# Patient Record
Sex: Female | Born: 1955 | Race: White | Hispanic: No | Marital: Married | Smoking: Never smoker
Health system: Southern US, Community
[De-identification: ages and names within clinical notes are randomized; demographics above are authoritative.]

## PROBLEM LIST (undated history)

## (undated) DIAGNOSIS — Z8489 Family history of other specified conditions: Secondary | ICD-10-CM

## (undated) DIAGNOSIS — K219 Gastro-esophageal reflux disease without esophagitis: Secondary | ICD-10-CM

## (undated) DIAGNOSIS — M1990 Unspecified osteoarthritis, unspecified site: Secondary | ICD-10-CM

## (undated) DIAGNOSIS — R51 Headache: Secondary | ICD-10-CM

## (undated) DIAGNOSIS — E039 Hypothyroidism, unspecified: Secondary | ICD-10-CM

## (undated) DIAGNOSIS — E66812 Obesity, class 2: Secondary | ICD-10-CM

## (undated) DIAGNOSIS — F329 Major depressive disorder, single episode, unspecified: Secondary | ICD-10-CM

## (undated) DIAGNOSIS — M47812 Spondylosis without myelopathy or radiculopathy, cervical region: Secondary | ICD-10-CM

## (undated) DIAGNOSIS — E669 Obesity, unspecified: Secondary | ICD-10-CM

## (undated) DIAGNOSIS — G5600 Carpal tunnel syndrome, unspecified upper limb: Secondary | ICD-10-CM

## (undated) DIAGNOSIS — Z8614 Personal history of Methicillin resistant Staphylococcus aureus infection: Secondary | ICD-10-CM

## (undated) DIAGNOSIS — G43909 Migraine, unspecified, not intractable, without status migrainosus: Secondary | ICD-10-CM

## (undated) DIAGNOSIS — M2550 Pain in unspecified joint: Secondary | ICD-10-CM

## (undated) DIAGNOSIS — F32A Depression, unspecified: Secondary | ICD-10-CM

## (undated) DIAGNOSIS — G4730 Sleep apnea, unspecified: Secondary | ICD-10-CM

## (undated) DIAGNOSIS — R0683 Snoring: Secondary | ICD-10-CM

## (undated) DIAGNOSIS — R7301 Impaired fasting glucose: Secondary | ICD-10-CM

## (undated) DIAGNOSIS — E785 Hyperlipidemia, unspecified: Secondary | ICD-10-CM

## (undated) DIAGNOSIS — I10 Essential (primary) hypertension: Secondary | ICD-10-CM

## (undated) DIAGNOSIS — R7303 Prediabetes: Secondary | ICD-10-CM

## (undated) HISTORY — PX: CYSTOSCOPY: SUR368

## (undated) HISTORY — DX: Hypothyroidism, unspecified: E03.9

## (undated) HISTORY — PX: UPPER GI ENDOSCOPY: SHX6162

## (undated) HISTORY — DX: Prediabetes: R73.03

## (undated) HISTORY — DX: Carpal tunnel syndrome, unspecified upper limb: G56.00

## (undated) HISTORY — DX: Personal history of Methicillin resistant Staphylococcus aureus infection: Z86.14

## (undated) HISTORY — PX: GANGLION CYST EXCISION: SHX1691

## (undated) HISTORY — PX: COLONOSCOPY: SHX174

## (undated) HISTORY — DX: Obesity, class 2: E66.812

## (undated) HISTORY — DX: Impaired fasting glucose: R73.01

## (undated) HISTORY — DX: Obesity, unspecified: E66.9

## (undated) HISTORY — PX: ESOPHAGOGASTRODUODENOSCOPY: SHX1529

## (undated) HISTORY — DX: Unspecified osteoarthritis, unspecified site: M19.90

## (undated) HISTORY — PX: APPENDECTOMY: SHX54

## (undated) HISTORY — DX: Snoring: R06.83

## (undated) HISTORY — DX: Pain in unspecified joint: M25.50

## (undated) HISTORY — DX: Migraine, unspecified, not intractable, without status migrainosus: G43.909

## (undated) HISTORY — DX: Spondylosis without myelopathy or radiculopathy, cervical region: M47.812

---

## 2001-08-09 ENCOUNTER — Ambulatory Visit (HOSPITAL_COMMUNITY): Admission: RE | Admit: 2001-08-09 | Discharge: 2001-08-09 | Payer: Self-pay | Admitting: Pulmonary Disease

## 2003-02-16 ENCOUNTER — Ambulatory Visit (HOSPITAL_COMMUNITY): Admission: RE | Admit: 2003-02-16 | Discharge: 2003-02-16 | Payer: Self-pay | Admitting: Pulmonary Disease

## 2003-03-02 ENCOUNTER — Encounter (HOSPITAL_COMMUNITY): Admission: RE | Admit: 2003-03-02 | Discharge: 2003-04-01 | Payer: Self-pay | Admitting: Pulmonary Disease

## 2003-05-10 ENCOUNTER — Ambulatory Visit (HOSPITAL_COMMUNITY): Admission: RE | Admit: 2003-05-10 | Discharge: 2003-05-10 | Payer: Self-pay | Admitting: Orthopedic Surgery

## 2003-05-10 ENCOUNTER — Encounter: Payer: Self-pay | Admitting: Orthopedic Surgery

## 2003-05-26 ENCOUNTER — Ambulatory Visit (HOSPITAL_COMMUNITY): Admission: RE | Admit: 2003-05-26 | Discharge: 2003-05-26 | Payer: Self-pay | Admitting: Orthopedic Surgery

## 2003-05-26 ENCOUNTER — Ambulatory Visit (HOSPITAL_BASED_OUTPATIENT_CLINIC_OR_DEPARTMENT_OTHER): Admission: RE | Admit: 2003-05-26 | Discharge: 2003-05-26 | Payer: Self-pay | Admitting: Orthopedic Surgery

## 2003-05-26 ENCOUNTER — Encounter (INDEPENDENT_AMBULATORY_CARE_PROVIDER_SITE_OTHER): Payer: Self-pay | Admitting: Specialist

## 2006-08-31 ENCOUNTER — Ambulatory Visit (HOSPITAL_COMMUNITY): Admission: RE | Admit: 2006-08-31 | Discharge: 2006-08-31 | Payer: Self-pay | Admitting: Pulmonary Disease

## 2006-08-31 ENCOUNTER — Encounter (INDEPENDENT_AMBULATORY_CARE_PROVIDER_SITE_OTHER): Payer: Self-pay | Admitting: *Deleted

## 2006-08-31 ENCOUNTER — Observation Stay (HOSPITAL_COMMUNITY): Admission: EM | Admit: 2006-08-31 | Discharge: 2006-08-31 | Payer: Self-pay | Admitting: Emergency Medicine

## 2007-06-24 ENCOUNTER — Ambulatory Visit: Payer: Self-pay | Admitting: Orthopedic Surgery

## 2007-06-24 DIAGNOSIS — S92309A Fracture of unspecified metatarsal bone(s), unspecified foot, initial encounter for closed fracture: Secondary | ICD-10-CM | POA: Insufficient documentation

## 2007-06-24 DIAGNOSIS — G43909 Migraine, unspecified, not intractable, without status migrainosus: Secondary | ICD-10-CM | POA: Insufficient documentation

## 2007-06-24 DIAGNOSIS — I10 Essential (primary) hypertension: Secondary | ICD-10-CM | POA: Insufficient documentation

## 2007-08-05 ENCOUNTER — Ambulatory Visit: Payer: Self-pay | Admitting: Orthopedic Surgery

## 2009-11-27 ENCOUNTER — Other Ambulatory Visit: Admission: RE | Admit: 2009-11-27 | Discharge: 2009-11-27 | Payer: Self-pay | Admitting: Obstetrics and Gynecology

## 2009-12-05 ENCOUNTER — Ambulatory Visit (HOSPITAL_COMMUNITY): Admission: RE | Admit: 2009-12-05 | Discharge: 2009-12-05 | Payer: Self-pay | Admitting: Obstetrics & Gynecology

## 2009-12-12 ENCOUNTER — Ambulatory Visit (HOSPITAL_COMMUNITY): Admission: RE | Admit: 2009-12-12 | Discharge: 2009-12-12 | Payer: Self-pay | Admitting: Obstetrics & Gynecology

## 2011-01-10 NOTE — Procedures (Signed)
   NAME:  Theresa Young, Theresa Young                          ACCOUNT NO.:  [id number]   MEDICAL RECORD NO.:  [id number]                   PATIENT TYPE:  OUT   LOCATION:  RAD                                  FACILITY:  [facility]   PHYSICIAN:  [place] Bing, M.D.               DATE OF BIRTH:  23-Oct-1955   DATE OF PROCEDURE:  02/16/2003  DATE OF DISCHARGE:                                  ECHOCARDIOGRAM   CLINICAL INFORMATION:  This 55 year old woman with hypertension.   M-MODE:  AORTA:  2.3  (<4.0)  LEFT ATRIUM:  3.2  (<4.0)  SEPTUM:  1.5  (0.7-1.1)  POSTERIOR WALL:  1.2  (0.7-1.1)  LV-DIASTOLE:  3.3  (<5.7)  LV-SYSTOLE:  2.5  (<4.0)   FINDINGS/IMPRESSION:  1. A technically suboptimal but adequate echocardiographic study.  2. Normal left and right atria.  3. Normal right ventricular size and function.  4. Normal aortic, tricuspid and pulmonic valves.  Slight focal calcification     of the mitral valve with normal function.  5. Normal internal dimension of the left ventricle; mild-to-moderate LVH.     Normal regional and global LV systolic function.                                               [place] Bing, M.D.    RR/MEDQ  D:  02/16/2003  T:  02/16/2003  Job:  [id number]

## 2011-01-10 NOTE — Op Note (Signed)
[name], Theresa Young                ACCOUNT NO.:  [id number]   MEDICAL RECORD NO.:  [id number]          PATIENT TYPE:  OBV   LOCATION:  A322                          FACILITY:  [facility]   PHYSICIAN:  Dalia Heading, M.D.  DATE OF BIRTH:  1956-06-24   DATE OF PROCEDURE:  08/31/2006  DATE OF DISCHARGE:  08/31/2006                               OPERATIVE REPORT   PREOPERATIVE DIAGNOSIS:  Acute appendicitis.   POSTOPERATIVE DIAGNOSIS:  Acute appendicitis.   PROCEDURE:  Laparoscopic appendectomy.   SURGEON:  Dr. Franky Macho.   ANESTHESIA:  General endotracheal.   INDICATIONS:  The patient is a 55 year old white female presented to Dr.  Shaune Pollack office with right side abdominal pain.  CT scan of the  abdomen and pelvis was performed which revealed acute appendicitis.  The  patient now comes to the operating room for laparoscopic appendectomy.  Risks and benefits of the procedure including bleeding, infection, and  the possibility of an open procedure were fully explained to the  patient, who gave informed consent.   PROCEDURE NOTE:  The patient is placed in supine position.  After  induction of general endotracheal anesthesia, the abdomen was prepped  and draped in the usual sterile technique with Techni-Care.  Surgical  site confirmation was performed.   A supraumbilical incision was made down to the fascia.  Veress needle  was introduced into the abdominal cavity and confirmation of placement  was done using the saline drop test.  The abdomen was then insufflated  to 16 mmHg pressure.  11 mm trocar was introduced into the abdominal  cavity under direct visualization without difficulty.  The patient was  placed in deeper Trendelenburg position.  Additional 11-mm trocar was  placed in suprapubic region.  A 5-mm trocar was placed left lower  quadrant region.  Appendix was visualized and noted be acutely inflamed.  The mesoappendix was divided using the harmonic scalpel.  Vascular  Endo-  GIA was placed across the base the appendix and fired.  The appendix was  removed using EndoCatch bag.  The appendiceal remnant was inspected and  noted be intact.  The right lower quadrant was then copiously irrigated  with normal saline.  All fluid and air were then evacuated from the  abdominal cavity prior to removal of the trocars.   All wounds were irrigated normal saline.  All wounds were injected with  0.5 cm Sensorcaine.  The supraumbilical fascia as well as suprapubic  fascia reapproximated using 0 Vicryl interrupted sutures.  All skin  incisions were closed using staples.  Bacitracin ointment was then  applied.   All tape and needle counts correct at the end of the procedure.  The  patient was extubated in the operating room and went back to recovery  room awake in stable condition.   COMPLICATIONS:  None.   SPECIMEN:  Appendix.   BLOOD LOSS:  Minimal.      Dalia Heading, M.D.  Electronically Signed     MAJ/MEDQ  D:  08/31/2006  T:  09/01/2006  Job:  [id number]   cc:  Edward L. Luan Pulling, M.D.  Fax: [phone]

## 2011-01-10 NOTE — Op Note (Signed)
   NAME:  Theresa Young, Theresa Young                          ACCOUNT NO.:  [id number]   MEDICAL RECORD NO.:  [id number]                   PATIENT TYPE:  AMB   LOCATION:  DSC                                  FACILITY:  [facility]   PHYSICIAN:  Cindee Salt, M.D.                    DATE OF BIRTH:  01-07-1956   DATE OF PROCEDURE:  05/26/2003  DATE OF DISCHARGE:                                 OPERATIVE REPORT   PREOPERATIVE DIAGNOSIS:  Mass, right thenar eminence.   POSTOPERATIVE DIAGNOSIS:  Mass, right thenar eminence.   OPERATION:  Excision of mass, right thenar eminence.   SURGEON:  Cindee Salt, M.D.   ASSISTANT:  Alfredo Bach.   ANESTHESIA:  IV regional.   HISTORY:  The patient is a 55 year old female with a history of a mass on  the thenar eminence of her right hand, dorsoradial aspect.   PROCEDURE:  The patient was brought to the operating room where a forearm-  based IV regional anesthetic was carried out without difficulty.  She was  prepped with Duraprep, supine position, right arm free.  An oblique incision  was made over the mass and carried down through subcutaneous tissue.  The  radial nerve fibers were identified and protected.  With blunt and sharp  dissection, this was carried down to the first dorsal compartment abductor  tendons.  A multi-lobulated yellow-tan solid tumor was immediately  encountered; this was quite large.  With blunt and sharp dissection, it was  dissected free.  The radial artery was protected.  The entire mass was  excised, bleeders electrocauterized.  The wound was irrigated.  The specimen  was sent to pathology.  The subcutaneous tissue was then closed with  interrupted 4-0 chromic sutures, the skin with interrupted 5-0 nylon  sutures.  It was noted that the mass originated at the level of the  carpometacarpal joint of the thumb and did not appear to enter into the  joint, was adherent to the insertion of the adductor pollicis longus.  A  sterile compressive  dressing and splint were applied.  The patient tolerated  the procedure well and was taken to the recovery room for observation in  satisfactory condition.   She is discharged home to return to the [facility] [place] in one  week on Vicodin and Keflex.                                               Cindee Salt, M.D.    Angelique Blonder  D:  05/26/2003  T:  05/27/2003  Job:  [id number]

## 2011-01-10 NOTE — Procedures (Signed)
   NAME:  Theresa Young, Theresa Young                          ACCOUNT NO.:  [id number]   MEDICAL RECORD NO.:  [id number]                   PATIENT TYPE:  PREC   LOCATION:                                       FACILITY:  [facility]   PHYSICIAN:  Edward L. Juanetta Gosling, M.D.             DATE OF BIRTH:  [birth year]   DATE OF PROCEDURE:  03/02/2003  DATE OF DISCHARGE:                                    STRESS TEST   INDICATIONS FOR PROCEDURE:  [name] has had problems with hypertension  and a very strong positive family history of coronary artery occlusive  disease.  She is undergoing exercise testing to rule out ischemic cardiac  disease.  There are no contraindications for the exercise testing.   RESULTS:  [name] exercised for six minutes and 20 seconds on the Bruce  protocol reaching and sustaining 7.0 METs.  Cardiolite was injected per  protocol.  Her maximum recorded heart rate was 166 which is 95% of her  maximal heart rate.  She stopped exercising because of fatigue.  Her blood  pressures while exercising were normal.  There were no electrocardiographic  changes suggestive of inducible ischemia.   IMPRESSION:  1. Good exercise tolerance.  2. No evidence of inducible ischemia.  3. No symptoms during exercise.  4. Cardiolite images are pending.  5. Normal blood pressure response to exercise.                                               Edward L. Juanetta Gosling, M.D.    ELH/MEDQ  D:  03/02/2003  T:  03/02/2003  Job:  [id number]

## 2011-10-16 ENCOUNTER — Other Ambulatory Visit: Payer: Self-pay | Admitting: Adult Health

## 2011-10-16 ENCOUNTER — Other Ambulatory Visit (HOSPITAL_COMMUNITY)
Admission: RE | Admit: 2011-10-16 | Discharge: 2011-10-16 | Disposition: A | Discharge status: Home / Self Care | Payer: PRIVATE HEALTH INSURANCE | Source: Ambulatory Visit | Attending: Obstetrics and Gynecology | Admitting: Obstetrics and Gynecology

## 2011-10-16 DIAGNOSIS — Z01419 Encounter for gynecological examination (general) (routine) without abnormal findings: Secondary | ICD-10-CM | POA: Insufficient documentation

## 2011-10-16 DIAGNOSIS — E049 Nontoxic goiter, unspecified: Secondary | ICD-10-CM

## 2011-10-20 ENCOUNTER — Other Ambulatory Visit (HOSPITAL_COMMUNITY): Payer: Self-pay

## 2011-10-23 ENCOUNTER — Ambulatory Visit (HOSPITAL_COMMUNITY)
Admission: RE | Admit: 2011-10-23 | Discharge: 2011-10-23 | Disposition: A | Discharge status: Home / Self Care | Payer: PRIVATE HEALTH INSURANCE | Source: Ambulatory Visit | Attending: Adult Health | Admitting: Adult Health

## 2011-10-23 DIAGNOSIS — E049 Nontoxic goiter, unspecified: Secondary | ICD-10-CM | POA: Insufficient documentation

## 2011-11-11 ENCOUNTER — Other Ambulatory Visit (HOSPITAL_COMMUNITY): Payer: Self-pay | Admitting: General Surgery

## 2011-11-11 DIAGNOSIS — E041 Nontoxic single thyroid nodule: Secondary | ICD-10-CM

## 2011-11-14 ENCOUNTER — Other Ambulatory Visit (HOSPITAL_COMMUNITY): Payer: Self-pay | Admitting: General Surgery

## 2011-11-14 ENCOUNTER — Ambulatory Visit (HOSPITAL_COMMUNITY)
Admission: RE | Admit: 2011-11-14 | Discharge: 2011-11-14 | Disposition: A | Discharge status: Home / Self Care | Payer: PRIVATE HEALTH INSURANCE | Source: Ambulatory Visit | Attending: General Surgery | Admitting: General Surgery

## 2011-11-14 VITALS — BP 134/85 | HR 81 | Temp 97.7°F | Resp 16

## 2011-11-14 DIAGNOSIS — E041 Nontoxic single thyroid nodule: Secondary | ICD-10-CM

## 2011-11-14 DIAGNOSIS — E049 Nontoxic goiter, unspecified: Secondary | ICD-10-CM | POA: Insufficient documentation

## 2011-11-14 NOTE — Progress Notes (Signed)
Lidocaine 2%          4mL injected 

## 2011-11-14 NOTE — Procedures (Signed)
SUCCESSFUL [place] RT THYROID MASS FNA X 4 NO COMP STABLE TO DC

## 2011-11-18 ENCOUNTER — Inpatient Hospital Stay (HOSPITAL_COMMUNITY): Admission: RE | Admit: 2011-11-18 | Payer: PRIVATE HEALTH INSURANCE | Source: Ambulatory Visit

## 2011-11-18 ENCOUNTER — Ambulatory Visit (HOSPITAL_COMMUNITY): Payer: PRIVATE HEALTH INSURANCE

## 2011-12-01 NOTE — H&P (Signed)
  NTS SOAP Note  Vital Signs:  Vitals as of: 11/06/2011: Systolic 151: Diastolic 87: Heart Rate 77: Temp 96.8F: Height 89ft 8in: Weight 236Lbs 0 Ounces: OFC 0in: Respiratory Rate 0: O2 Saturation 0: Pain Level 0: BMI 36  BMI : 35.88 kg/m2  Subjective: This 9 Years 57 Months old Female presents for of  THYROID NODULE: ,Has an enlarged right thyroid lobe.  Has had FNA in 2011 which was benign.  Recent u/s of thyroid shows enlargement of the right lobe with a complex nodule.  Left lobe with stable small hypoechoeic nodule.  Denies neck irradiatiion, family h/o thyoid cancer, voice change, heart palpitations, heat intolerance, dysphagia.  Review of Symptoms:  Constitutional:unremarkable migraines Eyes:unremarkable Nose/Mouth/Throat:unremarkable Cardiovascular:unremarkable Respiratory:unremarkable Gastrointestinal:unremarkable Genitourinary:unremarkable Musculoskeletal:unremarkable Skin:unremarkable Hematolgic/Lymphatic:unremarkable Allergic/Immunologic:unremarkable   Past Medical History:Reviewed   Past Medical History  Surgical History: appendectomy 2008 Medical Problems: headaches, high cholesterol, HTN Allergies: erythromycin Medications: hyzaar, relpax, crestor   Social History:Reviewed   Social History  Preferred Language: English ([place]) Race:  White Ethnicity: Not Hispanic / Latino Age: 56 Years 10 Months Marital Status:  S Alcohol:  No Recreational drug(s):  No   Smoking Status: Never smoker reviewed on 11/06/2011  Family History:Reviewed   Family History              Father:  Coronary Artery Disease             Mother:  Cancer-lung    Objective Information: General:Well appearing, well nourished in no distress. Head:Atraumatic; no masses; no abnormalities Enlarged right lobe of thyroid with obvious palpable nodule.  No lymphadenopathy noted.  Left lobe within normal  limits. Heart:RRR, no murmur or gallop.  Normal S1, S2.  No S3, S4.  Lungs:CTA bilaterally, no wheezes, rhonchi, rales.  Breathing unlabored.  Assessment:Enlarging nodule, right thyroid lobe, FNA shows follicular neoplasm  Diagnosis &amp; Procedure: DiagnosisCode: 241.0, ProcedureCode: 45409,    Plan:Scheduled for total thyroidectomy on 12/10/11.  Risks and benefits of procedure including bleeding, infection, the possibility of malignancy, and the possibility of nerve injury were fully explained to the patient, who gives informed consent.   Follow-up:Pending Test Results

## 2011-12-02 ENCOUNTER — Encounter (HOSPITAL_COMMUNITY): Payer: Self-pay | Admitting: Pharmacy Technician

## 2011-12-02 NOTE — Patient Instructions (Addendum)
[address] Simonian Stamey  12/02/2011   Your procedure is scheduled on:  Wednesday, April 17  Report to [facility] at 0800 AM.  Call this number if you have problems the morning of surgery: [phone]   Remember:   Do not eat food:After Midnight.  May have clear liquids:until Midnight .  Clear liquids include soda, tea, black coffee, apple or grape juice, broth.  Take these medicines the morning of surgery with A SIP OF WATER: Hyzaar   Do not wear jewelry, make-up or nail polish.  Do not wear lotions, powders, or perfumes. You may wear deodorant.  Do not shave 48 hours prior to surgery.  Do not bring valuables to the hospital.  Contacts, dentures or bridgework may not be worn into surgery.  Leave suitcase in the car. After surgery it may be brought to your room.  For patients admitted to the hospital, checkout time is 11:00 AM the day of discharge.   Patients discharged the day of surgery will not be allowed to drive home.  Name and phone number of your driver: Family  Special Instructions: CHG Shower Use Special Wash: 1/2 bottle night before surgery and 1/2 bottle morning of surgery.   Please read over the following fact sheets that you were given: Pain Booklet, Coughing and Deep Breathing, Surgical Site Infection Prevention, Anesthesia Post-op Instructions and Care and Recovery After Surgery  PATIENT INSTRUCTIONS POST-ANESTHESIA  IMMEDIATELY FOLLOWING SURGERY:  Do not drive or operate machinery for the first twenty four hours after surgery.  Do not make any important decisions for twenty four hours after surgery or while taking narcotic pain medications or sedatives.  If you develop intractable nausea and vomiting or a severe headache please notify your doctor immediately.  FOLLOW-UP:  Please make an appointment with your surgeon as instructed. You do not need to follow up with anesthesia unless specifically instructed to do so.  WOUND CARE INSTRUCTIONS (if applicable):  Keep a dry clean  dressing on the anesthesia/puncture wound site if there is drainage.  Once the wound has quit draining you may leave it open to air.  Generally you should leave the bandage intact for twenty four hours unless there is drainage.  If the epidural site drains for more than 36-48 hours please call the anesthesia department.  QUESTIONS?:  Please feel free to call your physician or the hospital operator if you have any questions, and they will be happy to assist you.     Thyroidectomy Thyroidectomy is the removal of part or all of your thyroid gland. Your thyroid gland is a butterfly-shaped gland at the base of your neck. It produces a substance called thyroid hormone, which regulates the physical and chemical processes that keep your body functioning and make energy available to your body (metabolism). The amount of thyroid gland tissue that is removed during a thyroidectomy depends on the reason for the procedure. Typically, if only a part of your gland is removed, enough thyroid gland tissue remains to maintain normal function. If your entire thyroid gland is removed or if the amount of thyroid gland tissue remaining is inadequate to maintain normal function, you will need life-long treatment with thyroid hormone on a daily basis. Thyroidectomy maybe performed when you have the following conditions:  Thyroid nodules. These are small, abnormal collections of tissue that form inside the thyroid gland. If these nodules begin to enlarge at a rapid rate, a sample of tissue from the nodule is taken through a needle and examined (needle  biopsy). This is done to determine if the nodules are cancerous. Depending on the outcome of this exam, thyroidectomy may be necessary.   Thyroid cancer.   Goiter, which is an enlarged thyroid gland. All or part of the thyroid gland may be removed if the gland has become so large that it causes difficulty breathing or swallowing.   Hyperthyroidism. This is when the thyroid gland  produces too much thyroid hormone. Hypothyroidism can cause symptoms of fluctuating weight, intolerance to heat, irritability, shortness of breath, and chest pain.  LET YOUR CAREGIVER KNOW ABOUT:   Allergies to food or medicine.   Medicines that you are taking, including vitamins, herbs, eyedrops, over-the-counter medicines, and creams.   Previous problems you have had with anesthetics or numbing medicines.   History of bleeding problems or blood clots.   Previous surgeries you have had.   Other health problems, including diabetes and kidney problems, you have had.   Possibility of pregnancy, if this applies.  BEFORE THE PROCEDURE   Do not eat or drink anything, including water, for at least 6 hours before the procedure.   Ask your caregiver whether you should stop taking certain medicines before the day of the procedure.  PROCEDURE  There are different ways that thyroidectomy is performed. For each type, you will be given a medicine to make you sleep (general anesthetic). The three main types of thyroidectomy are listed as follows:  Conventional thyroidectomy-A cut (incision) in the center portion of your lower neck is made with a scalpel. Muscles below your skin are separated to gain access to your thyroid gland. Your thyroid gland is dissected from your windpipe (trachea). Often a drain is placed at the incision site to drain any blood that accumulates under the skin after the procedure. This drain will be removed before you go home. The wound from the incision should heal within 2 weeks.   Endoscopic thyroidectomy-Small incisions are made in your lower neck. A small instrument (endoscope) is inserted under your skin at the incision sites. The endoscope used for thyroidectomy consists of 2 flexible tubes. Inside one of the tubes is a video camera that is used to guide the Careers adviser. Tools to remove the thyroid gland, including a tool to cut the gland (dissectors) and a suction device, are  inserted through the other tube. The surgeon uses the dissectors to dissect the thyroid gland from the trachea and remove it.   Robotic thyroidectomy-This procedure allows your thyroid gland to be removed through incisions in your armpit, your chest, or high in your neck. Instruments similar to endoscopes provide a 3-dimensional picture of the surgical site. Dissecting instruments are controlled by devices similar to joysticks. These devices allow more accurate manipulation of the instruments. After the blood supply to the gland is removed, the gland is cut into several pieces and removed through the incisions.  RISKS AND COMPLICATIONS Complications associated with thyroidectomy are rare, but they can occur. Possible complications include:  A decrease in parathyroid hormone levels (hypoparathyroidism)-Your parathyroid glands are located close behind your thyroid gland. They are responsible for maintaining calcium levels inthe body. If they are damaged or removed, levels of calcium in the blood become low and nerves become irritable, which can cause muscle spasms. Medicines are available to treat this.   Bacterial infection-This can often be treated with medicines that kill bacteria (antibiotics).   Damage to your voice box nerves-This could cause hoarseness or complete loss of voice.   Bleeding or airway obstruction.  AFTER  THE PROCEDURE   You will rest in the recovery room as you wake up.   When you first wake up, your throat may feel slightly sore.   You will not be allowed to eat or drink until instructed otherwise.   You will be taken to your hospital room. You will usually stay at the hospital for 1 or 2 nights.   If a drain is placed during the procedure, it usually is removed the next day.   You may have some mild neck pain.   Your voice may be weak. This usually is temporary.  Document Released: 02/04/2001 Document Revised: 07/31/2011 Document Reviewed: 11/13/2010 [facility]  Patient Information 2012 [place], [place].

## 2011-12-03 ENCOUNTER — Encounter (HOSPITAL_COMMUNITY)
Admission: RE | Admit: 2011-12-03 | Discharge: 2011-12-03 | Disposition: A | Discharge status: Home / Self Care | Payer: PRIVATE HEALTH INSURANCE | Source: Ambulatory Visit | Attending: General Surgery | Admitting: General Surgery

## 2011-12-03 ENCOUNTER — Encounter (HOSPITAL_COMMUNITY): Payer: Self-pay

## 2011-12-03 HISTORY — DX: Sleep apnea, unspecified: G47.30

## 2011-12-03 HISTORY — DX: Major depressive disorder, single episode, unspecified: F32.9

## 2011-12-03 HISTORY — DX: Depression, unspecified: F32.A

## 2011-12-03 HISTORY — DX: Headache: R51

## 2011-12-03 HISTORY — DX: Hyperlipidemia, unspecified: E78.5

## 2011-12-03 HISTORY — DX: Unspecified osteoarthritis, unspecified site: M19.90

## 2011-12-03 HISTORY — DX: Gastro-esophageal reflux disease without esophagitis: K21.9

## 2011-12-03 HISTORY — DX: Essential (primary) hypertension: I10

## 2011-12-03 LAB — DIFFERENTIAL
Basophils Relative: 0 % (ref 0–1)
Eosinophils Absolute: 0.2 10*3/uL (ref 0.0–0.7)
Lymphocytes Relative: 28 % (ref 12–46)
Lymphs Abs: 2.2 10*3/uL (ref 0.7–4.0)
Monocytes Absolute: 0.6 10*3/uL (ref 0.1–1.0)
Monocytes Relative: 7 % (ref 3–12)

## 2011-12-03 LAB — BASIC METABOLIC PANEL
CO2: 32 mEq/L (ref 19–32)
Chloride: 101 mEq/L (ref 96–112)
Creatinine, Ser: 0.81 mg/dL (ref 0.50–1.10)
GFR calc Af Amer: >90 mL/min (ref >90)
Potassium: 3.8 mEq/L (ref 3.5–5.1)

## 2011-12-03 LAB — SURGICAL PCR SCREEN: MRSA, PCR: NEGATIVE

## 2011-12-03 LAB — CBC
MCV: 90.1 fL (ref 78.0–100.0)
Platelets: 225 10*3/uL (ref 150–400)
RDW: 12.7 % (ref 11.5–15.5)
WBC: 7.7 10*3/uL (ref 4.0–10.5)

## 2011-12-03 NOTE — Progress Notes (Signed)
12/03/11 1427  OBSTRUCTIVE SLEEP APNEA  Have you ever been diagnosed with sleep apnea through a sleep study? No  Do you snore loudly (loud enough to be heard through closed doors)?  1  Do you often feel tired, fatigued, or sleepy during the daytime? 0  Has anyone observed you stop breathing during your sleep? 1  Do you have, or are you being treated for high blood pressure? 1  BMI more than 35 kg/m2? 1  Age over 54 years old? 1  Neck circumference greater than 40 cm/18 inches? 0  Gender: 0  Obstructive Sleep Apnea Score 5   Score 4 or greater  Updated health history;Results sent to PCP

## 2011-12-10 ENCOUNTER — Encounter (HOSPITAL_COMMUNITY)
Admission: RE | Disposition: A | Discharge status: Home / Self Care | Payer: Self-pay | Source: Ambulatory Visit | Attending: General Surgery

## 2011-12-10 ENCOUNTER — Ambulatory Visit (HOSPITAL_COMMUNITY)
Admission: RE | Admit: 2011-12-10 | Discharge: 2011-12-11 | Disposition: A | Discharge status: Home / Self Care | Payer: PRIVATE HEALTH INSURANCE | Source: Ambulatory Visit | Attending: General Surgery | Admitting: General Surgery

## 2011-12-10 ENCOUNTER — Ambulatory Visit (HOSPITAL_COMMUNITY): Payer: PRIVATE HEALTH INSURANCE | Admitting: Anesthesiology

## 2011-12-10 ENCOUNTER — Encounter (HOSPITAL_COMMUNITY): Payer: Self-pay | Admitting: Anesthesiology

## 2011-12-10 ENCOUNTER — Encounter (HOSPITAL_COMMUNITY): Payer: Self-pay | Admitting: *Deleted

## 2011-12-10 DIAGNOSIS — Z01810 Encounter for preprocedural cardiovascular examination: Secondary | ICD-10-CM | POA: Insufficient documentation

## 2011-12-10 DIAGNOSIS — E7800 Pure hypercholesterolemia, unspecified: Secondary | ICD-10-CM | POA: Insufficient documentation

## 2011-12-10 DIAGNOSIS — Z01812 Encounter for preprocedural laboratory examination: Secondary | ICD-10-CM | POA: Insufficient documentation

## 2011-12-10 DIAGNOSIS — E049 Nontoxic goiter, unspecified: Secondary | ICD-10-CM | POA: Insufficient documentation

## 2011-12-10 DIAGNOSIS — Z79899 Other long term (current) drug therapy: Secondary | ICD-10-CM | POA: Insufficient documentation

## 2011-12-10 DIAGNOSIS — I10 Essential (primary) hypertension: Secondary | ICD-10-CM | POA: Insufficient documentation

## 2011-12-10 HISTORY — PX: THYROIDECTOMY: SHX17

## 2011-12-10 LAB — BASIC METABOLIC PANEL
BUN: 7 mg/dL (ref 6–23)
CO2: 27 mEq/L (ref 19–32)
Calcium: 8.9 mg/dL (ref 8.4–10.5)
Creatinine, Ser: 0.83 mg/dL (ref 0.50–1.10)

## 2011-12-10 SURGERY — THYROIDECTOMY
Anesthesia: General | Site: Neck | Wound class: Clean

## 2011-12-10 MED ORDER — MENTHOL 3 MG MT LOZG: 1.0000 | LOZENGE | OROMUCOSAL | Status: DC | PRN

## 2011-12-10 MED ORDER — ENOXAPARIN SODIUM 40 MG/0.4ML ~~LOC~~ SOLN
SUBCUTANEOUS | Status: AC
  Filled 2011-12-10: qty 0.4

## 2011-12-10 MED ORDER — HEMOSTATIC AGENTS (NO CHARGE) OPTIME
TOPICAL | Status: DC | PRN
  Administered 2011-12-10: 1 via TOPICAL

## 2011-12-10 MED ORDER — MIDAZOLAM HCL 2 MG/2ML IJ SOLN
INTRAMUSCULAR | Status: AC
  Filled 2011-12-10: qty 2

## 2011-12-10 MED ORDER — PROPOFOL 10 MG/ML IV EMUL
INTRAVENOUS | Status: AC
  Filled 2011-12-10: qty 20

## 2011-12-10 MED ORDER — BUPIVACAINE HCL (PF) 0.5 % IJ SOLN
INTRAMUSCULAR | Status: DC | PRN
  Administered 2011-12-10: 4 mL

## 2011-12-10 MED ORDER — PHENYLEPHRINE HCL 10 MG/ML IJ SOLN
INTRAMUSCULAR | Status: DC | PRN
  Administered 2011-12-10 (×3): 100 ug via INTRAVENOUS

## 2011-12-10 MED ORDER — ROCURONIUM BROMIDE 50 MG/5ML IV SOLN
INTRAVENOUS | Status: AC
  Filled 2011-12-10: qty 1

## 2011-12-10 MED ORDER — ONDANSETRON HCL 4 MG PO TABS: 4.0000 mg | ORAL_TABLET | Freq: Four times a day (QID) | ORAL | Status: DC | PRN

## 2011-12-10 MED ORDER — LEVOTHYROXINE SODIUM 150 MCG PO TABS
150.0000 ug | ORAL_TABLET | Freq: Every day | ORAL | Status: DC
  Administered 2011-12-11: 150 ug via ORAL
  Filled 2011-12-10 (×2): qty 1

## 2011-12-10 MED ORDER — ROCURONIUM BROMIDE 100 MG/10ML IV SOLN
INTRAVENOUS | Status: DC | PRN
  Administered 2011-12-10: 10 mg via INTRAVENOUS
  Administered 2011-12-10: 50 mg via INTRAVENOUS

## 2011-12-10 MED ORDER — LOSARTAN POTASSIUM-HCTZ 100-12.5 MG PO TABS: 1.0000 | ORAL_TABLET | Freq: Every morning | ORAL | Status: DC

## 2011-12-10 MED ORDER — FENTANYL CITRATE 0.05 MG/ML IJ SOLN
INTRAMUSCULAR | Status: DC | PRN
  Administered 2011-12-10 (×3): 50 ug via INTRAVENOUS
  Administered 2011-12-10: 100 ug via INTRAVENOUS

## 2011-12-10 MED ORDER — CALCIUM CARBONATE-VITAMIN D 500-200 MG-UNIT PO TABS
1.0000 | ORAL_TABLET | Freq: Two times a day (BID) | ORAL | Status: DC
  Administered 2011-12-10 – 2011-12-11 (×3): 1 via ORAL
  Filled 2011-12-10 (×3): qty 1

## 2011-12-10 MED ORDER — DEXAMETHASONE SODIUM PHOSPHATE 4 MG/ML IJ SOLN
4.0000 mg | Freq: Once | INTRAMUSCULAR | Status: AC
  Administered 2011-12-10: 4 mg via INTRAVENOUS

## 2011-12-10 MED ORDER — LOSARTAN POTASSIUM 50 MG PO TABS
100.0000 mg | ORAL_TABLET | Freq: Every day | ORAL | Status: DC
  Administered 2011-12-11: 100 mg via ORAL
  Filled 2011-12-10: qty 2

## 2011-12-10 MED ORDER — LACTATED RINGERS IV SOLN
INTRAVENOUS | Status: DC | PRN
  Administered 2011-12-10 (×3): via INTRAVENOUS

## 2011-12-10 MED ORDER — PANTOPRAZOLE SODIUM 40 MG PO TBEC
40.0000 mg | DELAYED_RELEASE_TABLET | Freq: Every day | ORAL | Status: DC
  Administered 2011-12-10 – 2011-12-11 (×2): 40 mg via ORAL
  Filled 2011-12-10 (×2): qty 1

## 2011-12-10 MED ORDER — EPHEDRINE SULFATE 50 MG/ML IJ SOLN
INTRAMUSCULAR | Status: DC | PRN
  Administered 2011-12-10: 10 mg via INTRAVENOUS

## 2011-12-10 MED ORDER — FENTANYL CITRATE 0.05 MG/ML IJ SOLN
INTRAMUSCULAR | Status: AC
  Filled 2011-12-10: qty 2

## 2011-12-10 MED ORDER — ENOXAPARIN SODIUM 40 MG/0.4ML ~~LOC~~ SOLN
40.0000 mg | SUBCUTANEOUS | Status: DC
  Administered 2011-12-11: 40 mg via SUBCUTANEOUS
  Filled 2011-12-10: qty 0.4

## 2011-12-10 MED ORDER — GLYCOPYRROLATE 0.2 MG/ML IJ SOLN
INTRAMUSCULAR | Status: AC
  Filled 2011-12-10: qty 4

## 2011-12-10 MED ORDER — ONDANSETRON HCL 4 MG/2ML IJ SOLN: 4.0000 mg | Freq: Four times a day (QID) | INTRAMUSCULAR | Status: DC | PRN

## 2011-12-10 MED ORDER — MIDAZOLAM HCL 2 MG/2ML IJ SOLN
1.0000 mg | INTRAMUSCULAR | Status: DC | PRN
  Administered 2011-12-10: 1 mg via INTRAVENOUS

## 2011-12-10 MED ORDER — ACETAMINOPHEN 10 MG/ML IV SOLN
1000.0000 mg | Freq: Four times a day (QID) | INTRAVENOUS | Status: AC
  Administered 2011-12-10 – 2011-12-11 (×3): 1000 mg via INTRAVENOUS
  Filled 2011-12-10 (×3): qty 100

## 2011-12-10 MED ORDER — FENTANYL CITRATE 0.05 MG/ML IJ SOLN
25.0000 ug | INTRAMUSCULAR | Status: DC | PRN
  Administered 2011-12-10: 25 ug via INTRAVENOUS
  Administered 2011-12-10: 50 ug via INTRAVENOUS
  Administered 2011-12-10: 25 ug via INTRAVENOUS

## 2011-12-10 MED ORDER — SODIUM CHLORIDE 0.9 % IR SOLN
Status: DC | PRN
  Administered 2011-12-10: 1000 mL

## 2011-12-10 MED ORDER — ONDANSETRON HCL 4 MG/2ML IJ SOLN
INTRAMUSCULAR | Status: AC
  Filled 2011-12-10: qty 2

## 2011-12-10 MED ORDER — NEOSTIGMINE METHYLSULFATE 1 MG/ML IJ SOLN
INTRAMUSCULAR | Status: AC
  Filled 2011-12-10: qty 10

## 2011-12-10 MED ORDER — HYDROMORPHONE HCL PF 1 MG/ML IJ SOLN
1.0000 mg | INTRAMUSCULAR | Status: DC | PRN
  Administered 2011-12-10: 1 mg via INTRAVENOUS
  Filled 2011-12-10: qty 1

## 2011-12-10 MED ORDER — NEOSTIGMINE METHYLSULFATE 1 MG/ML IJ SOLN
INTRAMUSCULAR | Status: DC | PRN
  Administered 2011-12-10: 5 mg via INTRAVENOUS

## 2011-12-10 MED ORDER — EPHEDRINE SULFATE 50 MG/ML IJ SOLN
INTRAMUSCULAR | Status: AC
  Filled 2011-12-10: qty 1

## 2011-12-10 MED ORDER — FENTANYL CITRATE 0.05 MG/ML IJ SOLN
INTRAMUSCULAR | Status: AC
  Administered 2011-12-10: 25 ug via INTRAVENOUS
  Filled 2011-12-10: qty 5

## 2011-12-10 MED ORDER — ONDANSETRON HCL 4 MG/2ML IJ SOLN: 4.0000 mg | Freq: Once | INTRAMUSCULAR | Status: DC | PRN

## 2011-12-10 MED ORDER — PHENYLEPHRINE HCL 10 MG/ML IJ SOLN
INTRAMUSCULAR | Status: AC
  Filled 2011-12-10: qty 1

## 2011-12-10 MED ORDER — GLYCOPYRROLATE 0.2 MG/ML IJ SOLN
INTRAMUSCULAR | Status: AC
  Filled 2011-12-10: qty 1

## 2011-12-10 MED ORDER — HYDROCHLOROTHIAZIDE 12.5 MG PO CAPS
12.5000 mg | ORAL_CAPSULE | Freq: Every day | ORAL | Status: DC
  Administered 2011-12-11: 12.5 mg via ORAL
  Filled 2011-12-10: qty 1

## 2011-12-10 MED ORDER — LIDOCAINE HCL 4 % MT SOLN
OROMUCOSAL | Status: DC | PRN
  Administered 2011-12-10: 4 mL via TOPICAL

## 2011-12-10 MED ORDER — MIDAZOLAM HCL 5 MG/5ML IJ SOLN
INTRAMUSCULAR | Status: DC | PRN
  Administered 2011-12-10: 1 mg via INTRAVENOUS

## 2011-12-10 MED ORDER — LACTATED RINGERS IV SOLN
INTRAVENOUS | Status: DC
  Administered 2011-12-10 – 2011-12-11 (×3): via INTRAVENOUS

## 2011-12-10 MED ORDER — GLYCOPYRROLATE 0.2 MG/ML IJ SOLN
INTRAMUSCULAR | Status: DC | PRN
  Administered 2011-12-10: .8 mg via INTRAVENOUS

## 2011-12-10 MED ORDER — PROPOFOL 10 MG/ML IV EMUL
INTRAVENOUS | Status: DC | PRN
  Administered 2011-12-10: 150 mg via INTRAVENOUS

## 2011-12-10 MED ORDER — GLYCOPYRROLATE 0.2 MG/ML IJ SOLN
0.2000 mg | Freq: Once | INTRAMUSCULAR | Status: AC
  Administered 2011-12-10: 0.2 mg via INTRAVENOUS

## 2011-12-10 MED ORDER — BUPIVACAINE HCL (PF) 0.5 % IJ SOLN
INTRAMUSCULAR | Status: AC
  Filled 2011-12-10: qty 30

## 2011-12-10 MED ORDER — LACTATED RINGERS IV SOLN
INTRAVENOUS | Status: DC
  Administered 2011-12-10: 09:00:00 via INTRAVENOUS

## 2011-12-10 MED ORDER — FENTANYL CITRATE 0.05 MG/ML IJ SOLN
INTRAMUSCULAR | Status: AC
  Administered 2011-12-10: 25 ug via INTRAVENOUS
  Filled 2011-12-10: qty 2

## 2011-12-10 MED ORDER — ACETAMINOPHEN 10 MG/ML IV SOLN
INTRAVENOUS | Status: AC
  Filled 2011-12-10: qty 100

## 2011-12-10 MED ORDER — DEXAMETHASONE SODIUM PHOSPHATE 4 MG/ML IJ SOLN
INTRAMUSCULAR | Status: AC
  Filled 2011-12-10: qty 1

## 2011-12-10 MED ORDER — ENOXAPARIN SODIUM 40 MG/0.4ML ~~LOC~~ SOLN
40.0000 mg | Freq: Once | SUBCUTANEOUS | Status: AC
  Administered 2011-12-10: 40 mg via SUBCUTANEOUS

## 2011-12-10 MED ORDER — ONDANSETRON HCL 4 MG/2ML IJ SOLN
4.0000 mg | Freq: Once | INTRAMUSCULAR | Status: AC
  Administered 2011-12-10: 4 mg via INTRAVENOUS

## 2011-12-10 SURGICAL SUPPLY — 65 items
ADH SKN CLS APL DERMABOND .7 (GAUZE/BANDAGES/DRESSINGS)
APPLIER CLIP 11 MED OPEN (CLIP)
APPLIER CLIP 9.375 SM OPEN (CLIP) ×6
APR CLP MED 11 20 MLT OPN (CLIP)
APR CLP SM 9.3 20 MLT OPN (CLIP) ×3
ATTRACTOMAT 16X20 MAGNETIC DRP (DRAPES) ×2 IMPLANT
BAG HAMPER (MISCELLANEOUS) ×2 IMPLANT
BLADE SURG 15 STRL LF DISP TIS (BLADE) ×1 IMPLANT
BLADE SURG 15 STRL SS (BLADE) ×6
BLADE SURG SZ10 CARB STEEL (BLADE) ×2 IMPLANT
CLIP APPLIE 11 MED OPEN (CLIP) ×1 IMPLANT
CLIP APPLIE 9.375 SM OPEN (CLIP) ×1 IMPLANT
CLOTH BEACON ORANGE TIMEOUT ST (SAFETY) ×2 IMPLANT
COVER SURGICAL LIGHT HANDLE (MISCELLANEOUS) ×4 IMPLANT
DERMABOND ADVANCED (GAUZE/BANDAGES/DRESSINGS)
DERMABOND ADVANCED .7 DNX12 (GAUZE/BANDAGES/DRESSINGS) ×1 IMPLANT
DRAPE PED LAPAROTOMY (DRAPES) ×2 IMPLANT
DRAPE PROXIMA HALF (DRAPES) ×3 IMPLANT
DURAPREP 26ML APPLICATOR (WOUND CARE) ×1 IMPLANT
ELECT NDL TIP 2.8 STRL (NEEDLE) ×1 IMPLANT
ELECT NEEDLE TIP 2.8 STRL (NEEDLE) ×2 IMPLANT
ELECT REM PT RETURN 9FT ADLT (ELECTROSURGICAL) ×2
ELECTRODE REM PT RTRN 9FT ADLT (ELECTROSURGICAL) ×1 IMPLANT
FORMALIN 10 PREFIL 120ML (MISCELLANEOUS) ×4 IMPLANT
GAUZE SPONGE 4X4 16PLY XRAY LF (GAUZE/BANDAGES/DRESSINGS) ×4 IMPLANT
GLOVE BIO SURGEON STRL SZ7.5 (GLOVE) ×2 IMPLANT
GLOVE BIOGEL PI IND STRL 7.0 (GLOVE) IMPLANT
GLOVE BIOGEL PI IND STRL 7.5 (GLOVE) IMPLANT
GLOVE BIOGEL PI INDICATOR 7.0 (GLOVE) ×1
GLOVE BIOGEL PI INDICATOR 7.5 (GLOVE) ×1
GLOVE ECLIPSE 6.5 STRL STRAW (GLOVE) ×1 IMPLANT
GLOVE ECLIPSE 7.0 STRL STRAW (GLOVE) ×1 IMPLANT
GOWN STRL REIN XL XLG (GOWN DISPOSABLE) ×6 IMPLANT
HEMOSTAT SURGICEL 2X3 (HEMOSTASIS) ×2 IMPLANT
HEMOSTAT SURGICEL 4X8 (HEMOSTASIS) ×1 IMPLANT
KIT BLADEGUARD II DBL (SET/KITS/TRAYS/PACK) ×2 IMPLANT
KIT ROOM TURNOVER APOR (KITS) ×2 IMPLANT
MANIFOLD NEPTUNE II (INSTRUMENTS) ×2 IMPLANT
MARKER SKIN DUAL TIP RULER LAB (MISCELLANEOUS) ×2 IMPLANT
NDL HYPO 25X1 1.5 SAFETY (NEEDLE) ×1 IMPLANT
NEEDLE HYPO 25X1 1.5 SAFETY (NEEDLE) ×2 IMPLANT
NS IRRIG 1000ML POUR BTL (IV SOLUTION) ×2 IMPLANT
PACK BASIC III (CUSTOM PROCEDURE TRAY) ×2
PACK SRG BSC III STRL LF ECLPS (CUSTOM PROCEDURE TRAY) ×1 IMPLANT
PAD ARMBOARD 7.5X6 YLW CONV (MISCELLANEOUS) ×2 IMPLANT
PENCIL HANDSWITCHING (ELECTRODE) ×2 IMPLANT
SET BASIN LINEN APH (SET/KITS/TRAYS/PACK) ×2 IMPLANT
SHEARS HARMONIC 9CM CVD (BLADE) IMPLANT
SPONGE INTESTINAL PEANUT (DISPOSABLE) ×12 IMPLANT
STAPLER VISISTAT (STAPLE) ×2 IMPLANT
STRIP CLOSURE SKIN 1/2X4 (GAUZE/BANDAGES/DRESSINGS) ×1 IMPLANT
SUT ETHILON 3 0 FSL (SUTURE) ×1 IMPLANT
SUT ETHILON 4 0 PS 2 18 (SUTURE) ×2 IMPLANT
SUT SILK 2 0 (SUTURE) ×2
SUT SILK 2-0 18XBRD TIE 12 (SUTURE) ×1 IMPLANT
SUT SILK 3 0 (SUTURE)
SUT SILK 3-0 18XBRD TIE 12 (SUTURE) IMPLANT
SUT VIC AB 2-0 CT2 27 (SUTURE) ×2 IMPLANT
SUT VIC AB 3-0 SH 27 (SUTURE) ×6
SUT VIC AB 3-0 SH 27X BRD (SUTURE) ×1 IMPLANT
SUT VIC AB 4-0 PS2 27 (SUTURE) ×3 IMPLANT
SYR CONTROL 10ML LL (SYRINGE) ×2 IMPLANT
SYSTEM CHEST DRAIN TLS 7FR (DRAIN) ×2 IMPLANT
TOWEL OR 17X26 4PK STRL BLUE (TOWEL DISPOSABLE) IMPLANT
YANKAUER SUCT BULB TIP 10FT TU (MISCELLANEOUS) ×2 IMPLANT

## 2011-12-10 NOTE — Anesthesia Postprocedure Evaluation (Signed)
  Anesthesia Post-op Note  Patient: Theresa Young  Procedure(s) Performed: Procedure(s) (LRB): THYROIDECTOMY (N/A)  Patient Location: PACU  Anesthesia Type: General  Level of Consciousness: awake, alert , oriented and patient cooperative  Airway and Oxygen Therapy: Patient Spontanous Breathing and aerosol face mask  Post-op Pain: mild  Post-op Assessment: Post-op Vital signs reviewed  Post-op Vital Signs: Reviewed and stable  Complications: No apparent anesthesia complications

## 2011-12-10 NOTE — Progress Notes (Signed)
On arrival to PACU pt able to say eeeeeeeee.

## 2011-12-10 NOTE — Anesthesia Preprocedure Evaluation (Signed)
Anesthesia Evaluation  Patient identified by MRN, date of birth, ID band Patient awake    Reviewed: Allergy & Precautions, H&P , NPO status , Patient's Chart, lab work & pertinent test results  Airway Mallampati: II      Dental  (+) Teeth Intact   Pulmonary sleep apnea ,  breath sounds clear to auscultation hoarness due to sinus drainage today.        Cardiovascular hypertension, Pt. on medications Rhythm:Regular Rate:Normal     Neuro/Psych  Headaches, PSYCHIATRIC DISORDERS Depression    GI/Hepatic GERD-  Medicated and Controlled,  Endo/Other    Renal/GU      Musculoskeletal   Abdominal   Peds  Hematology   Anesthesia Other Findings   Reproductive/Obstetrics                           Anesthesia Physical Anesthesia Plan  ASA: II  Anesthesia Plan: General   Post-op Pain Management:    Induction: Intravenous  Airway Management Planned: Oral ETT  Additional Equipment:   Intra-op Plan:   Post-operative Plan: Extubation in OR  Informed Consent: I have reviewed the patients History and Physical, chart, labs and discussed the procedure including the risks, benefits and alternatives for the proposed anesthesia with the patient or authorized representative who has indicated his/her understanding and acceptance.     Plan Discussed with:   Anesthesia Plan Comments:         Anesthesia Quick Evaluation

## 2011-12-10 NOTE — Anesthesia Procedure Notes (Signed)
Procedure Name: Intubation Date/Time: 12/10/2011 9:58 AM Performed by: Glendora Score A Pre-anesthesia Checklist: Patient identified, Emergency Drugs available, Suction available and Patient being monitored Patient Re-evaluated:Patient Re-evaluated prior to inductionOxygen Delivery Method: Circle system utilized Preoxygenation: Pre-oxygenation with 100% oxygen Intubation Type: IV induction Ventilation: Mask ventilation without difficulty and Oral airway inserted - appropriate to patient size Laryngoscope Size: Hyacinth Meeker and 2 Grade View: Grade I Tube type: Oral Tube size: 7.0 mm Number of attempts: 1 Airway Equipment and Method: Stylet and LTA kit utilized Placement Confirmation: ETT inserted through vocal cords under direct vision,  positive ETCO2 and breath sounds checked- equal and bilateral Secured at: 22 cm Tube secured with: Tape Dental Injury: Teeth and Oropharynx as per pre-operative assessment

## 2011-12-10 NOTE — Interval H&P Note (Signed)
History and Physical Interval Note:  12/10/2011 9:35 AM  Theresa Young  has presented today for surgery, with the diagnosis of Thyroid enlarged   The various methods of treatment have been discussed with the patient and family. After consideration of risks, benefits and other options for treatment, the patient has consented to  Procedure(s) (LRB): THYROIDECTOMY (N/A) as a surgical intervention .  The patients' history has been reviewed, patient examined, no change in status, stable for surgery.  I have reviewed the patients' chart and labs.  Questions were answered to the patient's satisfaction.     [name]

## 2011-12-10 NOTE — Op Note (Signed)
Patient:  Theresa Young  DOB:  [birth year]  MRN:  [id number]   Preop Diagnosis:  Right thyroid lobe neoplasm  Postop Diagnosis:  Same  Procedure:  Total thyroidectomy  Surgeon:  Franky Macho, M.D.  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 She now presents the operating room for total thyroidectomy. The risks and benefits of the procedure including bleeding, infection, nerve injury, the possibly malignancy were fully explained to the patient, gave informed consent. Of note was the fact the patient was already hoarse recently due to sinus allergies.  Procedure note:  Patient is placed the supine position. After induction of general endotracheal anesthesia, the neck was prepped and draped using usual sterile technique with chlor prep. Surgical site confirmation was performed.  A transverse incision was made 2 fingerbreadths above the jugular notch. Dissection was taken down through the platysma. The strap muscles were divided along the midline and separated bluntly laterally. The left lobe of the thyroid was first identified. It was noted to be within normal limits in terms of size. The inferior thyroidal artery, middle thyroidal artery and vein, and superior thyroidal artery were all ligated and divided using clips and 3-0 silk ties. The left recurrent pharyngeal nerve was not seen and was not involved with dissection. Both parathyroid glands were noted in three-way bluntly from the thyroid gland. The gland was then brought lateral to medial to the trachea. Once was separated off trachea, it was excised and sent to pathology for further examination. Next, we turned our attention to the right lobe. It was very large with a large nodule noted in the superior pole and a smaller nodule noted in the inferior pole. Again, the inferior  thyroidal artery, middle thyroidal artery and vein, and the superior thyroidal artery along with the suspensory ligament were all divided and ligated using clips. The lobe was then excised and sent to pathology further examination. Any bleeding was controlled using small clips. No lymphadenopathy was noted. Was irrigated normal saline. Surgicel is placed in both thyroidal bed. A #7 round Jackson-Pratt drain was placed into this region and brought through separate stab wound inferior to the incision line. It was secured at the skin level using 3-0 nylon suture. The strap muscles were reapproximated along the midline using a 3-0 Vicryl running suture. The platysma was reapproximated using 3-0 Vicryl running suture. The skin was closed using a 4-0 Vicryl subcuticular suture. 0.5% Sensorcaine was instilled the surrounding wound. Steri-Strips and dressed a dressing were applied.  All tape and needle counts were correct the end of the procedure. Patient was extubated in the operating room and was transferred to PACU in stable condition. She was able to phonate E without difficulty.  Complications:  None  EBL:  50 cc  Specimen:  Left lobe of thyroid with isthmus, right lobe of thyroid

## 2011-12-10 NOTE — Preoperative (Signed)
Beta Blockers   Reason not to administer Beta Blockers:Not Applicable 

## 2011-12-10 NOTE — OR Nursing (Signed)
Up to bathroom to void 

## 2011-12-10 NOTE — Transfer of Care (Signed)
Immediate Anesthesia Transfer of Care Note  Patient: Theresa Young Reasons Stamey  Procedure(s) Performed: Procedure(s) (LRB): THYROIDECTOMY (N/A)  Patient Location: PACU  Anesthesia Type: General  Level of Consciousness: awake, alert , oriented and patient cooperative  Airway & Oxygen Therapy: Patient Spontanous Breathing and Patient connected to nasal cannula oxygen  Post-op Assessment: Report given to PACU RN  Post vital signs: Reviewed and stable  Complications: No apparent anesthesia complications

## 2011-12-11 ENCOUNTER — Encounter (HOSPITAL_COMMUNITY): Payer: Self-pay | Admitting: General Surgery

## 2011-12-11 LAB — CBC
Hemoglobin: 11.7 g/dL — ABNORMAL LOW (ref 12.0–15.0)
MCH: 30.8 pg (ref 26.0–34.0)
MCHC: 33.9 g/dL (ref 30.0–36.0)
MCV: 90.8 fL (ref 78.0–100.0)

## 2011-12-11 LAB — COMPREHENSIVE METABOLIC PANEL
ALT: 23 U/L (ref 0–35)
BUN: 6 mg/dL (ref 6–23)
CO2: 28 mEq/L (ref 19–32)
Calcium: 9.7 mg/dL (ref 8.4–10.5)
Creatinine, Ser: 0.72 mg/dL (ref 0.50–1.10)
GFR calc Af Amer: >90 mL/min (ref >90)
GFR calc non Af Amer: >90 mL/min (ref >90)
Glucose, Bld: 120 mg/dL — ABNORMAL HIGH (ref 70–99)
Sodium: 139 mEq/L (ref 135–145)
Total Protein: 5.8 g/dL — ABNORMAL LOW (ref 6.0–8.3)

## 2011-12-11 LAB — PHOSPHORUS: Phosphorus: 3.9 mg/dL (ref 2.3–4.6)

## 2011-12-11 MED ORDER — CALCIUM CARBONATE-VITAMIN D 500-200 MG-UNIT PO TABS: 1.0000 | ORAL_TABLET | Freq: Two times a day (BID) | ORAL | Status: DC

## 2011-12-11 MED ORDER — HYDROCODONE-ACETAMINOPHEN 5-325 MG PO TABS: 1.0000 | ORAL_TABLET | ORAL | Status: AC | PRN

## 2011-12-11 MED ORDER — LEVOTHYROXINE SODIUM 150 MCG PO TABS: 150.0000 ug | ORAL_TABLET | Freq: Every day | ORAL | Status: DC

## 2011-12-11 NOTE — Progress Notes (Signed)
UR Chart Review Completed  

## 2011-12-11 NOTE — Progress Notes (Signed)
IV removed, site WNL.  Pt given d/c instructions and new prescriptions.  Discussed home care and care of incision, and and possible complications with patient and discussed home medications, patient verbalizes understanding. F/U appointment in place, pt states they will keep appointment. Pt is stable at this time. Pt taken to main entrance in wheelchair by staff member.

## 2011-12-11 NOTE — Discharge Instructions (Signed)
Thyroidectomy Care After Refer to this sheet in the next few weeks. These instructions provide you with general information on caring for yourself after you leave the hospital. Your caregiver also may give you specific instructions. Your treatment has been planned according to the most current medical practices available, but problems sometimes occur. Call your caregiver if you have any problems or questions after your procedure. HOME CARE INSTRUCTIONS   It is normal to be sore for a few weeks following surgery. See your caregiver if your pain seems to be getting worse rather than better.   Only take over-the-counter or prescription medicines for pain, discomfort, or fever as directed by your caregiver. Avoid taking medicines that contain aspirin and ibuprofen because they increase the risk of bleeding.   Shower rather than bathe until instructed otherwise by your caregiver.   Change your bandages (dressings) as directed by your caregiver.   You may resume a normal diet and activities as directed by your caregiver.   Avoid lifting weight greater than 20 lb (9 kg) or participating in heavy exercise or contact sports for 10 days or as instructed by your caregiver.   Make an appointment to see your caregiver for stitch (suture) or staple removal.  SEEK MEDICAL CARE IF:   You have increased bleeding from your wound.   You have redness, swelling, or increasing pain from your wound or in your neck.   There is pus coming from your wound.   You have an oral temperature above 102 F (38.9 C).   There is a bad smell coming from the wound or dressing.   You develop lightheadedness or feel faint.   You develop numbness, tingling, or muscle spasms in your arms, hands, feet, or face.   You have difficulty swallowing.  SEEK IMMEDIATE MEDICAL CARE IF:   You develop a rash.   You have difficulty breathing.   You hear whistling noises that come from your chest.   You develop a cough that  becomes increasingly worse.   You develop any reaction or side effects to medicines given.   There is swelling in your neck.   You develop changes in speech or hoarseness, which is getting worse.  MAKE SURE YOU:   Understand these instructions.   Will watch your condition.   Will get help right away if you are not doing well or get worse.  Document Released: 02/28/2005 Document Revised: 07/31/2011 Document Reviewed: 10/18/2010 ExitCare Patient Information 2012 [employer]. 

## 2011-12-11 NOTE — Addendum Note (Signed)
Addendum  created 12/11/11 0756 by Marolyn Hammock, CRNA   Modules edited:Notes Section

## 2011-12-11 NOTE — Anesthesia Postprocedure Evaluation (Signed)
  Anesthesia Post-op Note  Patient: Theresa Young  Procedure(s) Performed: Procedure(s) (LRB): THYROIDECTOMY (N/A)  Patient Location: Room 323  Anesthesia Type: General  Level of Consciousness: awake, alert , oriented and patient cooperative  Airway and Oxygen Therapy: Patient Spontanous Breathing and Patient connected to nasal cannula oxygen  Post-op Pain: mild  Post-op Assessment: Post-op Vital signs reviewed, Patient's Cardiovascular Status Stable, Respiratory Function Stable, Patent Airway and Pain level controlled  Post-op Vital Signs: Reviewed and stable  Complications: No apparent anesthesia complications

## 2011-12-11 NOTE — Discharge Summary (Signed)
Physician Discharge Summary  Patient ID: Theresa Young MRN: [id number] DOB/AGE: 02-Jul-1956 56 y.o.  Admit date: 12/10/2011 Discharge date: 12/11/2011  Admission Diagnoses: Nontoxic thyroid with follicular nodule, right lobe  Discharge Diagnoses: Same, final pathology pending Active Problems:  * No active hospital problems. 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 She presented any [facility] on 12/10/2011 and underwent a total thyroidectomy. She tolerated the procedure well. Her postoperative course has been unremarkable. Her calcium levels have remained within normal limits. Final pathology is pending. She is being discharged home on postoperative day one in good and improving condition  Treatments: surgery: Total thyroidectomy on 12/10/2011  Discharge Exam: Blood pressure 103/67, pulse 77, temperature 98.1 F (36.7 C), temperature source Oral, resp. rate 18, height 5\' 7"  (1.702 m), weight 107 kg (235 lb 14.3 oz), SpO2 95.00%. General appearance: alert, cooperative and no distress Neck: Incision healing well. No swelling noted. Drain removed. Resp: clear to auscultation bilaterally Cardio: regular rate and rhythm, S1, S2 normal, no murmur, click, rub or gallop Her voice is unchanged from preop. Disposition:  home   Medication List  As of 12/11/2011 12:37 PM   STOP taking these medications         acetaminophen 500 MG tablet         TAKE these medications         calcium-vitamin D 500-200 MG-UNIT per tablet   Commonly known as: OSCAL WITH D   Take 1 tablet by mouth 2 (two) times daily.      eletriptan 40 MG tablet   Commonly known as: RELPAX   One tablet by mouth at onset of headache. May repeat in 2 hours if headache persists or recurs. may repeat in 2 hours if  necessary for migraines      HYDROcodone-acetaminophen 5-325 MG per tablet   Commonly known as: NORCO   Take 1-2 tablets by mouth every 4 (four) hours as needed for pain.      levothyroxine 150 MCG tablet   Commonly known as: SYNTHROID, LEVOTHROID   Take 1 tablet (150 mcg total) by mouth daily before breakfast.      losartan-hydrochlorothiazide 100-12.5 MG per tablet   Commonly known as: HYZAAR   Take 1 tablet by mouth every morning.      rosuvastatin 10 MG tablet   Commonly known as: CRESTOR   Take 10 mg by mouth every morning.           Follow-up Information    Follow up with Dalia Heading, MD. Schedule an appointment as soon as possible for a visit on 12/18/2011.   Contact information:   [address] [place] [address] [phone]          Signed: Dalia Heading 12/11/2011, 12:37 PM

## 2013-11-23 DIAGNOSIS — G4733 Obstructive sleep apnea (adult) (pediatric): Secondary | ICD-10-CM

## 2013-11-23 HISTORY — DX: Obstructive sleep apnea (adult) (pediatric): G47.33

## 2013-11-23 HISTORY — PX: OTHER SURGICAL HISTORY: SHX169

## 2013-12-05 ENCOUNTER — Other Ambulatory Visit (HOSPITAL_COMMUNITY)
Admission: RE | Admit: 2013-12-05 | Discharge: 2013-12-05 | Disposition: A | Discharge status: Home / Self Care | Payer: PRIVATE HEALTH INSURANCE | Source: Ambulatory Visit | Attending: Adult Health | Admitting: Adult Health

## 2013-12-05 ENCOUNTER — Ambulatory Visit (INDEPENDENT_AMBULATORY_CARE_PROVIDER_SITE_OTHER): Payer: PRIVATE HEALTH INSURANCE | Admitting: Adult Health

## 2013-12-05 ENCOUNTER — Encounter: Payer: Self-pay | Admitting: Adult Health

## 2013-12-05 VITALS — BP 120/80 | HR 76 | Ht 67.0 in | Wt 245.0 lb

## 2013-12-05 DIAGNOSIS — Z1151 Encounter for screening for human papillomavirus (HPV): Secondary | ICD-10-CM | POA: Insufficient documentation

## 2013-12-05 DIAGNOSIS — Z01419 Encounter for gynecological examination (general) (routine) without abnormal findings: Secondary | ICD-10-CM

## 2013-12-05 DIAGNOSIS — R0683 Snoring: Secondary | ICD-10-CM

## 2013-12-05 DIAGNOSIS — Z1212 Encounter for screening for malignant neoplasm of rectum: Secondary | ICD-10-CM

## 2013-12-05 LAB — HEMOCCULT GUIAC POC 1CARD (OFFICE): Fecal Occult Blood, POC: NEGATIVE

## 2013-12-05 NOTE — Patient Instructions (Signed)
Will get sleep study  physical in 1 year mammogram yearly  Colonoscopy at 63 Sleep Apnea  Sleep apnea is a sleep disorder characterized by abnormal pauses in breathing while you sleep. When your breathing pauses, the level of oxygen in your blood decreases. This causes you to move out of deep sleep and into light sleep. As a result, your quality of sleep is poor, and the system that carries your blood throughout your body (cardiovascular system) experiences stress. If sleep apnea remains untreated, the following conditions can develop:  High blood pressure (hypertension).  Coronary artery disease.  Inability to achieve or maintain an erection (impotence).  Impairment of your thought process (cognitive dysfunction). There are three types of sleep apnea: 1. Obstructive sleep apnea Pauses in breathing during sleep because of a blocked airway. 2. Central sleep apnea Pauses in breathing during sleep because the area of the brain that controls your breathing does not send the correct signals to the muscles that control breathing. 3. Mixed sleep apnea A combination of both obstructive and central sleep apnea. RISK FACTORS The following risk factors can increase your risk of developing sleep apnea:  Being overweight.  Smoking.  Having narrow passages in your nose and throat.  Being of older age.  Being female.  Alcohol use.  Sedative and tranquilizer use.  Ethnicity. Among individuals younger than 35 years, African Americans are at increased risk of sleep apnea. SYMPTOMS   Difficulty staying asleep.  Daytime sleepiness and fatigue.  Loss of energy.  Irritability.  Loud, heavy snoring.  Morning headaches.  Trouble concentrating.  Forgetfulness.  Decreased interest in sex. DIAGNOSIS  In order to diagnose sleep apnea, your caregiver will perform a physical examination. Your caregiver may suggest that you take a home sleep test. Your caregiver may also recommend that you  spend the night in a sleep lab. In the sleep lab, several monitors record information about your heart, lungs, and brain while you sleep. Your leg and arm movements and blood oxygen level are also recorded. TREATMENT The following actions may help to resolve mild sleep apnea:  Sleeping on your side.   Using a decongestant if you have nasal congestion.   Avoiding the use of depressants, including alcohol, sedatives, and narcotics.   Losing weight and modifying your diet if you are overweight. There also are devices and treatments to help open your airway:  Oral appliances. These are custom-made mouthpieces that shift your lower jaw forward and slightly open your bite. This opens your airway.  Devices that create positive airway pressure. This positive pressure "splints" your airway open to help you breathe better during sleep. The following devices create positive airway pressure:  Continuous positive airway pressure (CPAP) device. The CPAP device creates a continuous level of air pressure with an air pump. The air is delivered to your airway through a mask while you sleep. This continuous pressure keeps your airway open.  Nasal expiratory positive airway pressure (EPAP) device. The EPAP device creates positive air pressure as you exhale. The device consists of single-use valves, which are inserted into each nostril and held in place by adhesive. The valves create very little resistance when you inhale but create much more resistance when you exhale. That increased resistance creates the positive airway pressure. This positive pressure while you exhale keeps your airway open, making it easier to breath when you inhale again.  Bilevel positive airway pressure (BPAP) device. The BPAP device is used mainly in patients with central sleep apnea. This device  is similar to the CPAP device because it also uses an air pump to deliver continuous air pressure through a mask. However, with the BPAP  machine, the pressure is set at two different levels. The pressure when you exhale is lower than the pressure when you inhale.  Surgery. Typically, surgery is only done if you cannot comply with less invasive treatments or if the less invasive treatments do not improve your condition. Surgery involves removing excess tissue in your airway to create a wider passage way. Document Released: 08/01/2002 Document Revised: 12/06/2012 Document Reviewed: 12/18/2011 [facility] Patient Information 2014 [place], [place].

## 2013-12-05 NOTE — Progress Notes (Signed)
Patient ID: Theresa Young, female   DOB: 08-11-1956, 58 y.o.   MRN: [id number] History of Present Illness: Theresa Young is a 58 year old white female,recently married in for a pap and physical.Her husband says she snores and stops breathing up to 45 seconds.She also has a 56 year old nephew she cares for. Her husband, Brett CanalesSteve is also getting a sleep study in near future.  Current Medications, Allergies, Past Medical History, Past Surgical History, Family History and Social History were reviewed in [facility] electronic medical record.     Review of Systems: Patient denies any headaches, blurred vision, shortness of breath, chest pain, abdominal pain, problems with bowel movements, urination, or intercourse.She uses lubricant. No joint pain or mood swings.     Physical Exam:BP 120/80  Pulse 76  Ht 5\' 7"  (1.702 m)  Wt 245 lb (111.131 kg)  BMI 38.36 kg/m2 General:  Well developed, well nourished, no acute distress Skin:  Warm and dry Neck:  Midline trachea,  Thyroid absent Lungs; Clear to auscultation bilaterally Breast:  No dominant palpable mass, retraction, or nipple discharge Cardiovascular: Regular rate and rhythm Abdomen:  Soft, non tender, no hepatosplenomegaly Pelvic:  External genitalia is normal in appearance.  The vagina is normal in appearance. The cervix is atrophic,pap with HPV performed.  Uterus is felt to be normal size, shape, and contour.  No                adnexal masses or tenderness noted. Rectal: Good sphincter tone, no polyps, or hemorrhoids felt.  Hemoccult negative. Extremities:  No swelling or varicosities noted Psych:  No mood changes, alert and cooperative, seems happy   Impression: Yearly gyn exam Snores    Plan: Physical in 1 year Mammogram yearly Colonoscopy at [facility] [facility] at work Split sleep study ordered to r/o sleep apnea

## 2013-12-12 ENCOUNTER — Telehealth: Payer: Self-pay | Admitting: Adult Health

## 2013-12-12 NOTE — Telephone Encounter (Signed)
Spoke with Respiratory and that stated that the pt had not been called yet but that they would call her today. I LMOM for the pt and advised her that if she doesn't hear from them to call us back.

## 2013-12-19 ENCOUNTER — Ambulatory Visit: Payer: PRIVATE HEALTH INSURANCE | Attending: Adult Health | Admitting: Sleep Medicine

## 2013-12-19 ENCOUNTER — Encounter: Payer: Self-pay | Admitting: Neurology

## 2013-12-19 VITALS — Ht 67.0 in | Wt 245.0 lb

## 2013-12-19 DIAGNOSIS — R0609 Other forms of dyspnea: Secondary | ICD-10-CM | POA: Insufficient documentation

## 2013-12-19 DIAGNOSIS — R0683 Snoring: Secondary | ICD-10-CM

## 2013-12-19 DIAGNOSIS — R0989 Other specified symptoms and signs involving the circulatory and respiratory systems: Principal | ICD-10-CM | POA: Insufficient documentation

## 2013-12-20 NOTE — Addendum Note (Signed)
Addended by: Whitney PostHEDRICK, Loyed Wilmes P on: 12/20/2013 08:08 AM   Modules accepted: Level of Service

## 2014-01-02 NOTE — Sleep Study (Signed)
  [facility] Yaritsa Savarino A. Gerilyn Pilgrimoonquah, MD     [email or web address]        NOCTURNAL POLYSOMNOGRAM    LOCATION: SLEEP LAB FACILITY: [facility]   PHYSICIAN: Nasteho Glantz A. Gerilyn Pilgrimoonquah, M.D.   DATE OF STUDY: 12/19/2013.   REFERRING PHYSICIAN: Cyril MourningJennifer Griffin, NP.  INDICATIONS: The patient is a 58 year old presents with hypersomnia, obesity and snoring.  MEDICATIONS:  Prior to Admission medications   Medication Sig Start Date End Date Taking? Authorizing Provider  eletriptan (RELPAX) 40 MG tablet One tablet by mouth at onset of headache. May repeat in 2 hours if headache persists or recurs. may repeat in 2 hours if necessary for migraines    Historical Provider, MD  levothyroxine (SYNTHROID, LEVOTHROID) 150 MCG tablet Take 1 tablet (150 mcg total) by mouth daily before breakfast. 12/11/11 12/05/13  Dalia HeadingMark A Jenkins, MD  losartan-hydrochlorothiazide (HYZAAR) 100-12.5 MG per tablet Take 1 tablet by mouth every morning.    Historical Provider, MD  rosuvastatin (CRESTOR) 10 MG tablet Take 10 mg by mouth every morning.    Historical Provider, MD      EPWORTH SLEEPINESS SCALE: 12.   BMI: 38.   ARCHITECTURAL SUMMARY: Total recording time was 449 minutes. Sleep efficiency 70 %. Sleep latency 11 minutes. REM latency 237 minutes. Stage NI 8 %, N2 35 % and N3 40 % and REM sleep 16 %.    RESPIRATORY DATA:  Baseline oxygen saturation is 97 %. The lowest saturation is 79 %. The diagnostic AHI is 51. The patient was placed on Positive pressures starting at 5 and increase to 10. The optimal pressure is 10 with resolution of obstructive events and good tolerance.  LIMB MOVEMENT SUMMARY: PLM index 0.   ELECTROCARDIOGRAM SUMMARY: Average heart rate is 75. Frequent PVCs are observed.   IMPRESSION:  1. Severe obstructive sleep apnea syndrome which responded well to CPAP of 10. 2. Frequent PVCs.  Thanks for this referral.  Alena Blankenbeckler A. Gerilyn Pilgrimoonquah, M.D. Diplomat, Biomedical engineerAmerican Board of Sleep Medicine.

## 2014-01-03 ENCOUNTER — Telehealth: Payer: Self-pay | Admitting: Adult Health

## 2014-01-04 NOTE — Telephone Encounter (Signed)
Pt aware has severe OSA [facility] and send copy to Dr Juanetta GoslingHawkins

## 2014-01-04 NOTE — Telephone Encounter (Signed)
Pt wanted results of sleep study I told her I don't have it yet but I called and left message for sleep center to call me

## 2014-01-04 NOTE — Telephone Encounter (Signed)
Left message to call in am, as I am in meeting this pm

## 2014-01-04 NOTE — Telephone Encounter (Signed)
Left message to call back  

## 2014-01-06 ENCOUNTER — Telehealth: Payer: Self-pay | Admitting: Adult Health

## 2014-01-06 NOTE — Telephone Encounter (Signed)
[facility], told her to call them and ask for erica

## 2014-05-09 ENCOUNTER — Other Ambulatory Visit (HOSPITAL_COMMUNITY): Payer: Self-pay | Admitting: Pulmonary Disease

## 2014-05-09 ENCOUNTER — Ambulatory Visit (HOSPITAL_COMMUNITY)
Admission: RE | Admit: 2014-05-09 | Discharge: 2014-05-09 | Disposition: A | Discharge status: Home / Self Care | Payer: PRIVATE HEALTH INSURANCE | Source: Ambulatory Visit | Attending: Pulmonary Disease | Admitting: Pulmonary Disease

## 2014-05-09 DIAGNOSIS — M5030 Other cervical disc degeneration, unspecified cervical region: Secondary | ICD-10-CM | POA: Insufficient documentation

## 2014-05-09 DIAGNOSIS — R209 Unspecified disturbances of skin sensation: Secondary | ICD-10-CM | POA: Insufficient documentation

## 2014-05-09 DIAGNOSIS — R200 Anesthesia of skin: Secondary | ICD-10-CM

## 2014-05-11 ENCOUNTER — Other Ambulatory Visit (HOSPITAL_COMMUNITY): Payer: Self-pay | Admitting: Pulmonary Disease

## 2014-05-11 DIAGNOSIS — M542 Cervicalgia: Secondary | ICD-10-CM

## 2014-05-15 ENCOUNTER — Ambulatory Visit (HOSPITAL_COMMUNITY)
Admission: RE | Admit: 2014-05-15 | Discharge: 2014-05-15 | Disposition: A | Discharge status: Home / Self Care | Payer: PRIVATE HEALTH INSURANCE | Source: Ambulatory Visit | Attending: Pulmonary Disease | Admitting: Pulmonary Disease

## 2014-05-15 DIAGNOSIS — R209 Unspecified disturbances of skin sensation: Secondary | ICD-10-CM | POA: Diagnosis present

## 2014-05-15 DIAGNOSIS — M542 Cervicalgia: Secondary | ICD-10-CM

## 2014-07-27 ENCOUNTER — Ambulatory Visit (INDEPENDENT_AMBULATORY_CARE_PROVIDER_SITE_OTHER): Payer: PRIVATE HEALTH INSURANCE

## 2014-07-27 ENCOUNTER — Ambulatory Visit (INDEPENDENT_AMBULATORY_CARE_PROVIDER_SITE_OTHER): Payer: PRIVATE HEALTH INSURANCE | Admitting: Podiatry

## 2014-07-27 VITALS — BP 68/47 | HR 83 | Resp 16 | Ht 67.0 in | Wt 243.0 lb

## 2014-07-27 DIAGNOSIS — M722 Plantar fascial fibromatosis: Secondary | ICD-10-CM

## 2014-07-27 MED ORDER — MELOXICAM 15 MG PO TABS: 15.0000 mg | ORAL_TABLET | Freq: Every day | ORAL | Status: DC

## 2014-07-27 MED ORDER — METHYLPREDNISOLONE (PAK) 4 MG PO TABS: ORAL_TABLET | ORAL | Status: DC

## 2014-07-27 NOTE — Progress Notes (Signed)
   Subjective:    Patient ID: Theresa Young, female    DOB: [birth year], 58 y.o.   MRN: [id number]  HPI Comments: Heel pain bilateral plantar heels, been going on since July, first step down in the mornings are the worse, the right seems to be worse than the left. Primary doctor told me to do stretches and ice   Foot Pain      Review of Systems  All other systems reviewed and are negative.      Objective:   Physical Exam: I have reviewed her past medical history medications allergy surgery social history and review of systems area pulses are strongly palpable bilateral. Neurologic sensorium is intact per Semmes-Weinstein monofilament. Deep tendon reflexes are intact bilaterally muscle strength +5 over 5 dorsiflexion plantar flexors and inverters and everters all intrinsic musculature is intact. Orthopedic evaluation demonstrates all joints distal to the ankle had full range of motion without crepitation. Cutaneous evaluation demonstrates supple well-hydrated cutis no erythema edema saline as drainage or odor. She has pain on palpation medial calcaneal tubercle of bilateral heels. Radiographic evaluation of the bilateral heels demonstrate tissue increase in density at the plantar fascial calcaneal insertion site indicative of plantar fasciitis. Hallux valgus deformities are also noted on physical exam as well as on radiograph.        Assessment & Plan:  Assessment: Subjective this deformity bilateral. Plantar fasciitis bilateral. Left greater than right.  Plan: Discussed the etiology pathology conservative versus surgical therapies. We discussed appropriate shoe gear stretching exercises ice therapy shoe gear modifications. I injected the bilateral heels today with Kenalog and local anesthetic wrote a prescription for Medrol Dosepak to be followed by meloxicam. Dispensed bilateral plantar fascial braces and the single night splint. I will follow up with her in 1 month to reevaluate.

## 2014-07-27 NOTE — Patient Instructions (Signed)

## 2014-08-09 ENCOUNTER — Telehealth: Payer: Self-pay | Admitting: *Deleted

## 2014-08-09 NOTE — Telephone Encounter (Addendum)
Pt states the Meloxicam is helping her foot, but her stomach hurts her everyday, and she was told by friends, there was a medication that would help her stomach while she took Meloxicam.  Left message informing pt of Dr. Geryl RankinsHyatt's orders.

## 2014-08-10 NOTE — Telephone Encounter (Signed)
Please inform her that she may try OTC pepcid, prilosec or nexium.  If these fail to alleviate her symptoms then she should discontinue the medication.

## 2014-08-22 ENCOUNTER — Ambulatory Visit: Payer: PRIVATE HEALTH INSURANCE | Admitting: Podiatry

## 2014-08-22 ENCOUNTER — Ambulatory Visit (INDEPENDENT_AMBULATORY_CARE_PROVIDER_SITE_OTHER): Payer: PRIVATE HEALTH INSURANCE | Admitting: Podiatry

## 2014-08-22 ENCOUNTER — Encounter: Payer: Self-pay | Admitting: Podiatry

## 2014-08-22 VITALS — BP 127/66 | HR 72 | Resp 16

## 2014-08-22 DIAGNOSIS — M722 Plantar fascial fibromatosis: Secondary | ICD-10-CM

## 2014-08-23 NOTE — Progress Notes (Signed)
She presents today for follow-up of plantar fasciitis bilateral. She states that I have absolutely no pain and 100% better and it's great.  Objective: Vital signs are stable she's alert and oriented 3. Pulses are palpable today. No calf pain. She has no pain on palpation of the plantar calcaneal tubercle right or left heel.  Assessment: Resolving plantar fasciitis bilateral.  Plan: Continue all conservative therapies 1 month then discontinue. Follow-up with me with recurrence.

## 2014-10-31 ENCOUNTER — Ambulatory Visit (INDEPENDENT_AMBULATORY_CARE_PROVIDER_SITE_OTHER): Payer: PRIVATE HEALTH INSURANCE | Admitting: Podiatry

## 2014-10-31 ENCOUNTER — Encounter: Payer: Self-pay | Admitting: Podiatry

## 2014-10-31 VITALS — BP 120/57 | HR 75 | Resp 16

## 2014-10-31 DIAGNOSIS — M722 Plantar fascial fibromatosis: Secondary | ICD-10-CM | POA: Diagnosis not present

## 2014-10-31 NOTE — Progress Notes (Signed)
She presents today for follow-up of her plantar fasciitis. She states that the right when his really gone to acting up.  Objective: Vital signs are stable she is alert and oriented 3 she has pain on palpation medial calcaneal tubercles bilateral. Pulses are strongly palpable bilateral.  Assessment: Chronic intractable plantar fasciitis bilateral.  Plan: Injected the bilateral heels today with Kenalog and local anesthetic. Suggested she continue all conservative therapies.

## 2014-12-12 ENCOUNTER — Other Ambulatory Visit: Payer: PRIVATE HEALTH INSURANCE | Admitting: Adult Health

## 2014-12-12 ENCOUNTER — Other Ambulatory Visit: Payer: Self-pay | Admitting: *Deleted

## 2014-12-12 MED ORDER — MELOXICAM 15 MG PO TABS: 15.0000 mg | ORAL_TABLET | Freq: Every day | ORAL | Status: DC

## 2014-12-12 NOTE — Telephone Encounter (Signed)
[facility]  Dr. Maren BeachHyatt okayed the refill.

## 2014-12-21 ENCOUNTER — Encounter: Payer: Self-pay | Admitting: Adult Health

## 2014-12-21 ENCOUNTER — Ambulatory Visit (INDEPENDENT_AMBULATORY_CARE_PROVIDER_SITE_OTHER): Payer: PRIVATE HEALTH INSURANCE | Admitting: Adult Health

## 2014-12-21 VITALS — BP 134/80 | HR 76 | Ht 66.25 in | Wt 250.0 lb

## 2014-12-21 DIAGNOSIS — G4730 Sleep apnea, unspecified: Secondary | ICD-10-CM | POA: Insufficient documentation

## 2014-12-21 DIAGNOSIS — Z01419 Encounter for gynecological examination (general) (routine) without abnormal findings: Secondary | ICD-10-CM | POA: Diagnosis not present

## 2014-12-21 DIAGNOSIS — E890 Postprocedural hypothyroidism: Secondary | ICD-10-CM

## 2014-12-21 DIAGNOSIS — Z1212 Encounter for screening for malignant neoplasm of rectum: Secondary | ICD-10-CM | POA: Diagnosis not present

## 2014-12-21 DIAGNOSIS — E039 Hypothyroidism, unspecified: Secondary | ICD-10-CM | POA: Insufficient documentation

## 2014-12-21 HISTORY — DX: Postprocedural hypothyroidism: E89.0

## 2014-12-21 LAB — HEMOCCULT GUIAC POC 1CARD (OFFICE): FECAL OCCULT BLD: NEGATIVE

## 2014-12-21 NOTE — Patient Instructions (Signed)
Physical in 1 year Mammogram yearly Colonoscopy per [facility] at [facility] 

## 2014-12-21 NOTE — Progress Notes (Signed)
Patient ID: Theresa Young, female   DOB: 1955-09-02, 59 y.o.   MRN: [id number] History of Present Illness: Theresa Young is a 59 year old white female, married in for well woman gyn exam.She had a normal pap with negative HPV 12/05/13.   Current Medications, Allergies, Past Medical History, Past Surgical History, Family History and Social History were reviewed in [facility] Link electronic medical record.     Review of Systems: Patient denies any daily headaches, hearing loss, fatigue, blurred vision, shortness of breath, chest pain, abdominal pain, problems with bowel movements, urination, or intercourse. No joint pain or mood swings.[facility] headaches better, maybe 2 a year.Is using C PAP and is doing good. PCP is Dr Juanetta GoslingHawkins.   Physical Exam:BP 134/80 mmHg  Pulse 76  Ht 5' 6.25" (1.683 m)  Wt 250 lb (113.399 kg)  BMI 40.04 kg/m2 General:  Well developed, well nourished, no acute distress Skin:  Warm and dry Neck:  Midline trachea, thyroid absent, good ROM, no lymphadenopathy Lungs; Clear to auscultation bilaterally Breast:  No dominant palpable mass, retraction, or nipple discharge Cardiovascular: Regular rate and rhythm Abdomen:  Soft, non tender, no hepatosplenomegaly Pelvic:  External genitalia is normal in appearance, no lesions.  The vagina is normal in appearance. Urethra has no lesions or masses. The cervix is smooth.  Uterus is felt to be normal size, shape, and contour.  No adnexal masses or tenderness noted.Bladder is non tender, no masses felt. Rectal: Good sphincter tone, no polyps, or hemorrhoids felt.  Hemoccult negative. Extremities/musculoskeletal:  No swelling or varicosities noted, no clubbing or cyanosis Psych:  No mood changes, alert and cooperative,seems happy   Impression: Well woman gyn exam no pap Hypothyroid Sleep apnea    Plan: Check TSH Physical in 1 year Mammogram yearly Colonoscopy per GI Labs at work

## 2014-12-22 ENCOUNTER — Telehealth: Payer: Self-pay | Admitting: Adult Health

## 2014-12-22 LAB — TSH: TSH: 3.6 u[IU]/mL (ref 0.450–4.500)

## 2014-12-22 NOTE — Telephone Encounter (Signed)
Pt aware TSH 3.6 which is good

## 2015-05-14 ENCOUNTER — Ambulatory Visit (HOSPITAL_COMMUNITY)
Admission: RE | Admit: 2015-05-14 | Discharge: 2015-05-14 | Disposition: A | Discharge status: Home / Self Care | Payer: PRIVATE HEALTH INSURANCE | Source: Ambulatory Visit | Attending: Pulmonary Disease | Admitting: Pulmonary Disease

## 2015-05-14 ENCOUNTER — Other Ambulatory Visit (HOSPITAL_COMMUNITY): Payer: Self-pay | Admitting: Pulmonary Disease

## 2015-05-14 DIAGNOSIS — M79604 Pain in right leg: Secondary | ICD-10-CM | POA: Insufficient documentation

## 2015-05-14 DIAGNOSIS — R52 Pain, unspecified: Secondary | ICD-10-CM

## 2015-05-14 DIAGNOSIS — R2241 Localized swelling, mass and lump, right lower limb: Secondary | ICD-10-CM | POA: Diagnosis not present

## 2015-05-17 ENCOUNTER — Ambulatory Visit (HOSPITAL_COMMUNITY)
Admission: RE | Admit: 2015-05-17 | Discharge: 2015-05-17 | Disposition: A | Discharge status: Home / Self Care | Payer: PRIVATE HEALTH INSURANCE | Source: Ambulatory Visit | Attending: Pulmonary Disease | Admitting: Pulmonary Disease

## 2015-05-17 DIAGNOSIS — I081 Rheumatic disorders of both mitral and tricuspid valves: Secondary | ICD-10-CM | POA: Insufficient documentation

## 2015-05-17 DIAGNOSIS — I10 Essential (primary) hypertension: Secondary | ICD-10-CM | POA: Diagnosis not present

## 2015-05-17 DIAGNOSIS — I371 Nonrheumatic pulmonary valve insufficiency: Secondary | ICD-10-CM | POA: Insufficient documentation

## 2015-05-17 DIAGNOSIS — R600 Localized edema: Secondary | ICD-10-CM | POA: Diagnosis not present

## 2015-05-17 DIAGNOSIS — R609 Edema, unspecified: Secondary | ICD-10-CM | POA: Insufficient documentation

## 2015-05-17 HISTORY — PX: TRANSTHORACIC ECHOCARDIOGRAM: SHX275

## 2015-06-09 ENCOUNTER — Other Ambulatory Visit: Payer: Self-pay | Admitting: Podiatry

## 2015-06-11 NOTE — Telephone Encounter (Signed)
Pt needs an appt prior to future refills. 

## 2015-08-26 HISTORY — PX: FOOT SURGERY: SHX648

## 2016-01-11 ENCOUNTER — Ambulatory Visit (INDEPENDENT_AMBULATORY_CARE_PROVIDER_SITE_OTHER): Payer: PRIVATE HEALTH INSURANCE | Admitting: Adult Health

## 2016-01-11 ENCOUNTER — Encounter: Payer: Self-pay | Admitting: Adult Health

## 2016-01-11 VITALS — BP 136/78 | HR 66 | Ht 66.25 in | Wt 239.0 lb

## 2016-01-11 DIAGNOSIS — Z1211 Encounter for screening for malignant neoplasm of colon: Secondary | ICD-10-CM | POA: Diagnosis not present

## 2016-01-11 DIAGNOSIS — Z01419 Encounter for gynecological examination (general) (routine) without abnormal findings: Secondary | ICD-10-CM | POA: Diagnosis not present

## 2016-01-11 LAB — HEMOCCULT GUIAC POC 1CARD (OFFICE): FECAL OCCULT BLD: NEGATIVE

## 2016-01-11 NOTE — Patient Instructions (Signed)
Pap and physical in 1 year Mammogram yearly Labs at work Colonoscopy per GI  

## 2016-01-11 NOTE — Progress Notes (Signed)
Patient ID: Theresa Young J Stcharles, female   DOB: 1955-09-19, 60 y.o.   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 PCP is Dr Juanetta GoslingHawkins.  Current Medications, Allergies, Past Medical History, Past Surgical History, Family History and Social History were reviewed in [facility] electronic medical record.     Review of Systems: Patient denies any headaches, hearing loss, fatigue, blurred vision, shortness of breath, chest pain, abdominal pain, problems with bowel movements, urination, or intercourse. No joint pain or mood swings.    Physical Exam:BP 136/78 mmHg  Pulse 66  Ht 5' 6.25" (1.683 m)  Wt 239 lb (108.41 kg)  BMI 38.27 kg/m2 General:  Well developed, well nourished, no acute distress Skin:  Warm and dry Neck:  Midline trachea, normal thyroid, good ROM, no lymphadenopathy Lungs; Clear to auscultation bilaterally Breast:  No dominant palpable mass, retraction, or nipple discharge Cardiovascular: Regular rate and rhythm Abdomen:  Soft, non tender, no hepatosplenomegaly Pelvic:  External genitalia is normal in appearance, no lesions.  The vagina is normal in appearance. Urethra has no lesions or masses. The cervix is smooth.  Uterus is felt to be normal size, shape, and contour.  No adnexal masses or tenderness noted.Bladder is non tender, no masses felt. Rectal: Good sphincter tone, no polyps, or hemorrhoids felt.  Hemoccult negative. Extremities/musculoskeletal:  No swelling or varicosities noted, no clubbing or cyanosis Psych:  No mood changes, alert and cooperative,seems happy Praised over weight loss efforts.  Impression: Well woman gyn exam no pap    Plan: Pap and physical in 1 year Mammogram yearly Labs at work Colonoscopy per  GI

## 2016-01-30 ENCOUNTER — Encounter: Payer: Self-pay | Admitting: Adult Health

## 2016-04-29 ENCOUNTER — Ambulatory Visit (INDEPENDENT_AMBULATORY_CARE_PROVIDER_SITE_OTHER): Payer: PRIVATE HEALTH INSURANCE | Admitting: Podiatry

## 2016-04-29 ENCOUNTER — Encounter: Payer: Self-pay | Admitting: Podiatry

## 2016-04-29 ENCOUNTER — Ambulatory Visit (INDEPENDENT_AMBULATORY_CARE_PROVIDER_SITE_OTHER): Payer: PRIVATE HEALTH INSURANCE

## 2016-04-29 DIAGNOSIS — M779 Enthesopathy, unspecified: Secondary | ICD-10-CM

## 2016-04-29 DIAGNOSIS — M2011 Hallux valgus (acquired), right foot: Secondary | ICD-10-CM

## 2016-04-29 NOTE — Patient Instructions (Addendum)
Pre-Operative Instructions  Congratulations, you have decided to take an important step to improving your quality of life.  You can be assured that the doctors of [facility] will be with you every step of the way.  1. Plan to be at the surgery center/hospital at least 1 (one) hour prior to your scheduled time unless otherwise directed by the surgical center/hospital staff.  You must have a responsible adult accompany you, remain during the surgery and drive you home.  Make sure you have directions to the surgical center/hospital and know how to get there on time. 2. For hospital based surgery you will need to obtain a history and physical form from your family physician within 1 month prior to the date of surgery- we will give you a form for you primary physician.  3. We make every effort to accommodate the date you request for surgery.  There are however, times where surgery dates or times have to be moved.  We will contact you as soon as possible if a change in schedule is required.   4. No Aspirin/Ibuprofen for one week before surgery.  If you are on aspirin, any non-steroidal anti-inflammatory medications (Mobic, Aleve, Ibuprofen) you should stop taking it 7 days prior to your surgery.  You make take Tylenol  For pain prior to surgery.  5. Medications- If you are taking daily heart and blood pressure medications, seizure, reflux, allergy, asthma, anxiety, pain or diabetes medications, make sure the surgery center/hospital is aware before the day of surgery so they may notify you which medications to take or avoid the day of surgery. 6. No food or drink after midnight the night before surgery unless directed otherwise by surgical center/hospital staff. 7. No alcoholic beverages 24 hours prior to surgery.  No smoking 24 hours prior to or 24 hours after surgery. 8. Wear loose pants or shorts- loose enough to fit over bandages, boots, and casts. 9. No slip on shoes, sneakers are best. 10. Bring  your boot with you to the surgery center/hospital.  Also bring crutches or a walker if your physician has prescribed it for you.  If you do not have this equipment, it will be provided for you after surgery. 11. If you have not been contracted by the surgery center/hospital by the day before your surgery, call to confirm the date and time of your surgery. 12. Leave-time from work may vary depending on the type of surgery you have.  Appropriate arrangements should be made prior to surgery with your employer. 13. Prescriptions will be provided immediately following surgery by your doctor.  Have these filled as soon as possible after surgery and take the medication as directed. 14. Remove nail polish on the operative foot. 15. Wash the night before surgery.  The night before surgery wash the foot and leg well with the antibacterial soap provided and water paying special attention to beneath the toenails and in between the toes.  Rinse thoroughly with water and dry well with a towel.  Perform this wash unless told not to do so by your physician.  Enclosed: 1 Ice pack (please put in freezer the night before surgery)   1 Hibiclens skin cleaner   Pre-op Instructions  If you have any questions regarding the instructions, do not hesitate to call our office.  [place]: [address] , [place] [address] [phone]  [place]: [address] [place], [place] [address] [phone]  [place]: [address]  [place], [place] [address] [phone]   Dr.   Norman Regal DPM, Dr. Matthew Wagoner DPM, Dr. M. Todd Hyatt DPM, Dr. Titorya Stover DPM 

## 2016-04-29 NOTE — Progress Notes (Signed)
She presents today for surgical consult regarding her right foot. We reviewed her past medical history medications allergies surgeon social history today. At this point she is complaining of chronic pain to the right foot particularly of the plantar fascia right her bunion on the right foot the top part of her right foot and now her second toenail which appears to be ingrown.  Objective: Vital signs are stable she is alert and oriented 3. Pulses are palpable. Neurologic sensorium is intact. Deep tendon reflexes are intact and muscle strength is normal bilateral symmetrical. Orthopedic evaluation does demonstrate dorsal spurring with a palpable nodular mass nonpulsatile nature dorsal aspect of the right foot overlying second metatarsal base. She also has pain on palpation near the gingival the right heel. And a very large hypertrophic medial condyle deviation of the toe resulting in hallux abductovalgus deformity of the right foot. She also has an ingrown toenail second digital plate right with mild erythema the cellulitis drainage or odor. Radiographs reviewed today do demonstrate an increase in the first metatarsal angle greater than normal value. This increasing greater angle resulted in an increase in the hallux abductus angle with early osteoarthritic changes as well as subluxation. She also has dorsal spurring noted midfoot. 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 Dorsal tarsal exostosis. Ingrown toenail second digit right foot.  Plan: We discussed the etiology pathology conservative versus surgical therapies. At this point I consented her today for a endoscopic plantar fasciotomy a dorsal tarsal exostectomy and a Austin bunion repair with screw fixation as well as a surgical matrixectomy. I answered all the questions regarding these procedures to the best of my ability in layman's terms. She understood this was amenable to it and  signed all 3 cages the consent form. We did discuss the possible postoperative palpitations which may include but are not limited to postoperative pain bleeding swelling infection recurrence and need for further surgery overcorrection under correction loss of digit loss of limb loss of life. She understands this is amenable to it and signed all 3 pages of the consent form.

## 2016-05-01 ENCOUNTER — Telehealth: Payer: Self-pay | Admitting: *Deleted

## 2016-05-01 NOTE — Telephone Encounter (Signed)
"  I'm calling to see if you have started the pre-cert for my surgery.  My insurance said it normally takes 7 days to process."  I will take care of it.  I am calling to let you know I called [employer].  They told me the surgical center is out of network.  "Oh no, that means my deductible will go up to $1000 and I'll be responsible for 30%.  I don't want to have to pay too much out of pocket.  Can you tell me how much I would be responsible for?"  I can get Chip BoerVicki to give you a call on tomorrow.  "Is there anywhere else Dr. Al CorpusHyatt does surgeries?"  No, he only does it at [facility].  He may be able to refer you to another doctor in the practice that does surgery at [facility].  I will let you know when he gives me a response.  "I really hate to have someone else do it but I don't want to pay a lot out of pocket."

## 2016-05-02 NOTE — Telephone Encounter (Signed)
"  I'm kind of in a bind with surgery center being out of network.  I was speaking with my co-worker and she mentioned Dr. Charlsie Merlesegal doing it."  Dr. Charlsie Merlesegal does not do surgery at [facility].  I am waiting on a response from Dr. Al CorpusHyatt to see if he wants to refer you to another doctor.  "You will let me know something when you hear from Dr. Al CorpusHyatt?"  Yes, I will.

## 2016-05-05 NOTE — Telephone Encounter (Signed)
Give her to evans to do at [facility].  She will have to come meet with him for his own consult though.

## 2016-05-05 NOTE — Telephone Encounter (Signed)
"  This is Misty calling from the surgical center.  I called [employer] [facility] [employer].  They couldn't find us in there system but I told them to check with [employer] website and they were able to find us.  We are in network.  The girl said there system is kind of screwy."  Okay great, I'll let the patient know.    I called and left patient a message that everything is back on track for Friday.  Dr. Al CorpusHyatt will be doing the surgery at [facility].  Surgery has been authorized as well.  I obtained authorization from [place] at [facility].  Authorization number is [id number].  I called and informed Aram BeechamCynthia at [facility].

## 2016-05-07 ENCOUNTER — Other Ambulatory Visit: Payer: Self-pay | Admitting: Podiatry

## 2016-05-07 MED ORDER — CEPHALEXIN 500 MG PO CAPS: 500.0000 mg | ORAL_CAPSULE | Freq: Three times a day (TID) | ORAL | 0 refills | Status: DC

## 2016-05-07 MED ORDER — PROMETHAZINE HCL 25 MG PO TABS: 25.0000 mg | ORAL_TABLET | Freq: Three times a day (TID) | ORAL | 0 refills | Status: DC | PRN

## 2016-05-07 MED ORDER — OXYCODONE-ACETAMINOPHEN 10-325 MG PO TABS: ORAL_TABLET | ORAL | 0 refills | Status: DC

## 2016-05-09 ENCOUNTER — Encounter: Payer: Self-pay | Admitting: Podiatry

## 2016-05-09 DIAGNOSIS — M2011 Hallux valgus (acquired), right foot: Secondary | ICD-10-CM | POA: Diagnosis not present

## 2016-05-09 DIAGNOSIS — M25774 Osteophyte, right foot: Secondary | ICD-10-CM | POA: Diagnosis not present

## 2016-05-09 DIAGNOSIS — L600 Ingrowing nail: Secondary | ICD-10-CM | POA: Diagnosis not present

## 2016-05-09 DIAGNOSIS — M722 Plantar fascial fibromatosis: Secondary | ICD-10-CM | POA: Diagnosis not present

## 2016-05-15 ENCOUNTER — Ambulatory Visit (INDEPENDENT_AMBULATORY_CARE_PROVIDER_SITE_OTHER): Payer: PRIVATE HEALTH INSURANCE | Admitting: Podiatry

## 2016-05-15 ENCOUNTER — Ambulatory Visit (INDEPENDENT_AMBULATORY_CARE_PROVIDER_SITE_OTHER): Payer: PRIVATE HEALTH INSURANCE

## 2016-05-15 ENCOUNTER — Encounter: Payer: Self-pay | Admitting: Podiatry

## 2016-05-15 VITALS — BP 129/78 | HR 79 | Temp 96.6°F | Resp 16

## 2016-05-15 DIAGNOSIS — M722 Plantar fascial fibromatosis: Secondary | ICD-10-CM

## 2016-05-15 DIAGNOSIS — L600 Ingrowing nail: Secondary | ICD-10-CM

## 2016-05-15 DIAGNOSIS — M2011 Hallux valgus (acquired), right foot: Secondary | ICD-10-CM

## 2016-05-15 DIAGNOSIS — Z9889 Other specified postprocedural states: Secondary | ICD-10-CM

## 2016-05-15 DIAGNOSIS — M898X9 Other specified disorders of bone, unspecified site: Secondary | ICD-10-CM

## 2016-05-15 NOTE — Progress Notes (Signed)
She presents today 1 week status post dorsal tarsal exostectomy endoscopic plantar fasciotomy Austin bunionectomy with screw and removal of toenail second toe on the right foot. She denies fever chills nausea vomiting muscle aches and pains. States the only vomiting and so she had was immediately after surgery upon returning home. She also states that she takes 1 pain pill just at nighttime but not during the day.  Objective: Vital signs are stable she is alert and oriented 3 she presents today Cam Walker intact dry sterile dressing intact was removed demonstrates mild edema no erythema saline odor sutures are intact margins were coapted radiographs 3 views taken today do demonstrate Austin bunion repair with screw fixation. No fractures are identified.  Assessment: Nonsurgical foot right.  Plan: Redress today dresser compressive dressing follow up with nursing staff in 1 week for suture removal. She will continue to wear her Cam Walker or night splint for the next month at bedtime however she will then be placed in a Darco shoe next visit with a compression anklet.

## 2016-05-16 NOTE — Progress Notes (Signed)
DOS 09.15.2017 Austin Bunion Repair Right; Dorsal Tarsal Exostectomy Right; Endoscopic Plantar Fasciotomy Right Foot; Excision 2nd Toenail Right Foot (Perm. Chemical Matrixectomy)

## 2016-05-22 ENCOUNTER — Ambulatory Visit (INDEPENDENT_AMBULATORY_CARE_PROVIDER_SITE_OTHER): Payer: PRIVATE HEALTH INSURANCE | Admitting: Podiatry

## 2016-05-22 DIAGNOSIS — Z9889 Other specified postprocedural states: Secondary | ICD-10-CM | POA: Diagnosis not present

## 2016-05-22 DIAGNOSIS — M2011 Hallux valgus (acquired), right foot: Secondary | ICD-10-CM | POA: Diagnosis not present

## 2016-05-22 NOTE — Progress Notes (Signed)
Subjective:     Patient ID: Theresa Young, female   DOB: 1956/03/26, 60 y.o.   MRN: [id number]  HPI patient states she's doing well but she still gets some swelling and pain especially if she's been on her foot to much   Review of Systems     Objective:   Physical Exam Neurovascular status intact negative Homan sign was noted with well-healing surgical sites dorsum right foot right mid foot and heel with mild edema consistent with surgery but no drainage noted and good range of motion first MPJ    Assessment:     Doing well post foot reconstruction right    Plan:     Sterile dressing reapplied compression    applied and advised on elevation and continued immobilization. Stitches removed from the heel right medial lateral side and wound edges well coapted with no drainage and patient be checked back 2 weeks or earlier if needed

## 2016-06-05 ENCOUNTER — Ambulatory Visit (INDEPENDENT_AMBULATORY_CARE_PROVIDER_SITE_OTHER): Payer: PRIVATE HEALTH INSURANCE | Admitting: Podiatry

## 2016-06-05 ENCOUNTER — Ambulatory Visit (INDEPENDENT_AMBULATORY_CARE_PROVIDER_SITE_OTHER): Payer: PRIVATE HEALTH INSURANCE

## 2016-06-05 DIAGNOSIS — Z9889 Other specified postprocedural states: Secondary | ICD-10-CM

## 2016-06-05 DIAGNOSIS — M2011 Hallux valgus (acquired), right foot: Secondary | ICD-10-CM

## 2016-06-05 NOTE — Progress Notes (Signed)
She presents today for follow-up of [facility]. Date of surgery 05/09/2016. States that she seems to be doing quite well.  Objective: Vital signs are stable she is alert and oriented 3. Great range of motion first metatarsophalangeal joint radiograph demonstrates A fragment in good position with internal fixation. No open lesions or wounds.  Assessment: Well on surgical foot.  Plan: Follow up with me in 2-3 weeks and we'll allow her to get back into regular pair shoes.

## 2016-07-03 ENCOUNTER — Other Ambulatory Visit: Payer: PRIVATE HEALTH INSURANCE

## 2016-07-15 ENCOUNTER — Ambulatory Visit (INDEPENDENT_AMBULATORY_CARE_PROVIDER_SITE_OTHER): Payer: PRIVATE HEALTH INSURANCE | Admitting: Podiatry

## 2016-07-15 ENCOUNTER — Ambulatory Visit (INDEPENDENT_AMBULATORY_CARE_PROVIDER_SITE_OTHER): Payer: PRIVATE HEALTH INSURANCE

## 2016-07-15 DIAGNOSIS — Z9889 Other specified postprocedural states: Secondary | ICD-10-CM

## 2016-07-15 DIAGNOSIS — M2011 Hallux valgus (acquired), right foot: Secondary | ICD-10-CM

## 2016-07-15 NOTE — Progress Notes (Signed)
She presents today for follow-up postsurgical repair right foot. She is status post Austin bunion repair dorsal tarsal exostectomy and a endoscopic plantar fasciotomy right foot. She denies fever chills nausea vomiting muscle aches. States that the foot is still swollen and tender at times. Date of surgery 05/09/2016.  Objective: Vital signs are stable alert and oriented 3 mild tenderness on palpation of the first metatarsophalangeal joint but has a great free range of motion first metatarsophalangeal joint with no complications. I see no signs of infection noted heel pain and all incisions have healed well. Radiographs taken today demonstrates 3 views right foot osseously mature adult with internal fixation first metatarsal right foot intact and well healing. Mild edema noted dorsally on lateral view.  Assessment: Well-healing surgical foot 2 months.  Plan: Follow up with me in 1 month for final set of x-rays.

## 2016-07-28 ENCOUNTER — Encounter: Payer: Self-pay | Admitting: Adult Health

## 2016-07-28 ENCOUNTER — Ambulatory Visit (INDEPENDENT_AMBULATORY_CARE_PROVIDER_SITE_OTHER): Payer: PRIVATE HEALTH INSURANCE | Admitting: Adult Health

## 2016-07-28 VITALS — BP 142/70 | HR 84 | Ht 67.0 in | Wt 248.5 lb

## 2016-07-28 DIAGNOSIS — N611 Abscess of the breast and nipple: Secondary | ICD-10-CM | POA: Insufficient documentation

## 2016-07-28 DIAGNOSIS — N644 Mastodynia: Secondary | ICD-10-CM

## 2016-07-28 MED ORDER — SULFAMETHOXAZOLE-TRIMETHOPRIM 800-160 MG PO TABS: 1.0000 | ORAL_TABLET | Freq: Two times a day (BID) | ORAL | 0 refills | Status: DC

## 2016-07-28 NOTE — Patient Instructions (Signed)
Use warm compresses 3-4 x daily Take septra ds 1 bid x 10 days Referred to Dr Lovell SheehanJenkins Follow up in 1 week  Get boil eze (coal tar)

## 2016-07-28 NOTE — Progress Notes (Signed)
Subjective:     Patient ID: Theresa Young, female   DOB: 05/04/1956, 60 y.o.   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   Review of Systems +pain and redness right breast Reviewed past medical,surgical, social and family history. Reviewed medications and allergies.     Objective:   Physical Exam BP (!) 142/70 (BP Location: Left Arm, Patient Position: Sitting, Cuff Size: Large)   Pulse 84   Ht 5\' 7"  (1.702 m)   Wt 248 lb 8 oz (112.7 kg)   BMI 38.92 kg/m   PHQ 2 score 0.  Skin warm and dry,  Breasts:no dominate palpable mass, retraction or nipple discharge on left, on right, no retraction or nipple discharge but has a 8 x 5 cm firm mass between 5-7 o'clock, with redness extending to 13 cm and has opening that is draining blood fluid, is very tender.Dr Despina HiddenEure in for co exam.She says tylenol and advil helps,declines pain meds, she says has some at home from where had foot surgery.     Assessment:     Right breast abscess     Plan:     Rx septra ds 1 bid x 10 days #20 Use warm compresses 3-4 x daily Use Boil EZE thin layer per Dr Despina HiddenEure Referred to Dr Lovell SheehanJenkins for evaluation  Follow up in 1 week if has not seen Dr Lovell SheehanJenkins

## 2016-07-29 ENCOUNTER — Telehealth: Payer: Self-pay | Admitting: *Deleted

## 2016-07-29 MED ORDER — ONDANSETRON 4 MG PO TBDP: 4.0000 mg | ORAL_TABLET | Freq: Three times a day (TID) | ORAL | 0 refills | Status: DC | PRN

## 2016-07-29 MED ORDER — DOXYCYCLINE HYCLATE 100 MG PO TABS: 100.0000 mg | ORAL_TABLET | Freq: Two times a day (BID) | ORAL | 0 refills | Status: DC

## 2016-07-29 NOTE — Telephone Encounter (Signed)
Theresa Young started bactrim and breast better but has nausea, vomiting and dizzy and then got migraine, stop bactrim , will rx doxycycline and zofran

## 2016-07-29 NOTE — Telephone Encounter (Signed)
Spoke with pt. Pt started Bactrim yesterday. She is having nausea and vomiting and dizzy. + headache. She took Relpax for headache. Please advise. Thanks!! JSY

## 2016-08-06 ENCOUNTER — Ambulatory Visit: Payer: PRIVATE HEALTH INSURANCE | Admitting: Adult Health

## 2016-08-12 ENCOUNTER — Ambulatory Visit: Payer: PRIVATE HEALTH INSURANCE

## 2017-02-16 ENCOUNTER — Other Ambulatory Visit (HOSPITAL_COMMUNITY)
Admission: RE | Admit: 2017-02-16 | Discharge: 2017-02-16 | Disposition: A | Discharge status: Home / Self Care | Payer: PRIVATE HEALTH INSURANCE | Source: Ambulatory Visit | Attending: Adult Health | Admitting: Adult Health

## 2017-02-16 ENCOUNTER — Ambulatory Visit (INDEPENDENT_AMBULATORY_CARE_PROVIDER_SITE_OTHER): Payer: PRIVATE HEALTH INSURANCE | Admitting: Adult Health

## 2017-02-16 ENCOUNTER — Encounter: Payer: Self-pay | Admitting: Adult Health

## 2017-02-16 VITALS — BP 120/70 | HR 74 | Ht 67.4 in | Wt 255.0 lb

## 2017-02-16 DIAGNOSIS — Z01419 Encounter for gynecological examination (general) (routine) without abnormal findings: Secondary | ICD-10-CM | POA: Insufficient documentation

## 2017-02-16 DIAGNOSIS — Z1211 Encounter for screening for malignant neoplasm of colon: Secondary | ICD-10-CM | POA: Diagnosis not present

## 2017-02-16 DIAGNOSIS — Z1212 Encounter for screening for malignant neoplasm of rectum: Secondary | ICD-10-CM

## 2017-02-16 LAB — HEMOCCULT GUIAC POC 1CARD (OFFICE): FECAL OCCULT BLD: NEGATIVE

## 2017-02-16 NOTE — Addendum Note (Signed)
Addended by: Federico FlakeNES, PEGGY A on: 02/16/2017 10:13 AM   Modules accepted: Orders

## 2017-02-16 NOTE — Progress Notes (Signed)
Patient ID: Theresa Young, female   DOB: [birth year], 61 y.o.   MRN: [id number] History of Present Illness:  Theresa Young is a 61 year old white female, married in for a well woman gyn exam and pap.She recently retired and just got back from [place]. PCP is Dr Juanetta GoslingHawkins.   Current Medications, Allergies, Past Medical History, Past Surgical History, Family History and Social History were reviewed in [facility] electronic medical record.     Review of Systems: Patient denies any headaches, hearing loss, fatigue, blurred vision, shortness of breath, chest pain, abdominal pain, problems with bowel movements, urination, or intercourse. No joint pain or mood swings.    Physical Exam:BP 120/70   Pulse 74   Ht 5' 7.4" (1.712 m)   Wt 255 lb (115.7 kg)   BMI 39.47 kg/m  General:  Well developed, well nourished, no acute distress Skin:  Warm and dry Neck:  Midline trachea, normal thyroid, good ROM, no lymphadenopathy Lungs; Clear to auscultation bilaterally Breast:  No dominant palpable mass, retraction, or nipple discharge Cardiovascular: Regular rate and rhythm Abdomen:  Soft, non tender, no hepatosplenomegaly Pelvic:  External genitalia is normal in appearance, no lesions.  The vagina is normal in appearance. Urethra has no lesions or masses. The cervix is smooth, pap with HPV performed.  Uterus is felt to be normal size, shape, and contour.  No adnexal masses or tenderness noted.Bladder is non tender, no masses felt. Rectal: Good sphincter tone, no polyps, or hemorrhoids felt.  Hemoccult negative. Extremities/musculoskeletal:  No swelling or varicosities noted, no clubbing or cyanosis Psych:  No mood changes, alert and cooperative,seems happy PHQ 2 score 0.  Impression: 1. Encounter for routine gynecological examination with Papanicolaou smear of cervix   2. Screening for colorectal cancer       Plan: Physical in 1 year  Pap in 3 years Mammogram yearly(was normal 02/12/17) Labs at  work Colonoscopy per GI

## 2017-02-17 LAB — CYTOLOGY - PAP
DIAGNOSIS: NEGATIVE
HPV (WINDOPATH): NOT DETECTED

## 2018-02-16 LAB — CBC: RBC: 4.66 (ref 3.87–5.11)

## 2018-02-16 LAB — CBC AND DIFFERENTIAL
HCT: 40 (ref 36–46)
Hemoglobin: 14.2 (ref 12.0–16.0)
Neutrophils Absolute: 7296
Platelets: 191 (ref 150–399)
WBC: 9.9

## 2018-02-16 LAB — LIPID PANEL
Cholesterol: 176 (ref 0–200)
HDL: 57 (ref 35–70)
LDL Cholesterol: 91
LDl/HDL Ratio: 3.1
Triglycerides: 183 — AB (ref 40–160)

## 2018-02-16 LAB — BASIC METABOLIC PANEL
BUN: 13 (ref 4–21)
CO2: 25 — AB (ref 13–22)
Chloride: 106 (ref 99–108)
Creatinine: 0.8 (ref 0.5–1.1)
Glucose: 104
Potassium: 3.5 (ref 3.4–5.3)
Sodium: 140 (ref 137–147)

## 2018-02-16 LAB — COMPREHENSIVE METABOLIC PANEL
Albumin: 4.5 (ref 3.5–5.0)
Calcium: 9.6 (ref 8.7–10.7)
GFR calc Af Amer: 96
GFR calc non Af Amer: 83

## 2018-02-16 LAB — HEPATIC FUNCTION PANEL
Alkaline Phosphatase: 56 (ref 25–125)
Bilirubin, Total: 0.7

## 2018-02-16 LAB — HEMOGLOBIN A1C: Hemoglobin A1C: 5.5

## 2018-02-18 ENCOUNTER — Encounter: Payer: Self-pay | Admitting: Adult Health

## 2018-02-18 ENCOUNTER — Ambulatory Visit (INDEPENDENT_AMBULATORY_CARE_PROVIDER_SITE_OTHER): Payer: PRIVATE HEALTH INSURANCE | Admitting: Adult Health

## 2018-02-18 VITALS — BP 134/73 | HR 92 | Ht 67.25 in | Wt 257.5 lb

## 2018-02-18 DIAGNOSIS — Z1211 Encounter for screening for malignant neoplasm of colon: Secondary | ICD-10-CM | POA: Insufficient documentation

## 2018-02-18 DIAGNOSIS — Z01419 Encounter for gynecological examination (general) (routine) without abnormal findings: Secondary | ICD-10-CM

## 2018-02-18 DIAGNOSIS — Z1212 Encounter for screening for malignant neoplasm of rectum: Secondary | ICD-10-CM

## 2018-02-18 LAB — HEMOCCULT GUIAC POC 1CARD (OFFICE): FECAL OCCULT BLD: NEGATIVE

## 2018-02-18 NOTE — Progress Notes (Signed)
Patient ID: Theresa Young, female   DOB: [birth year], 62 y.o.   MRN: [id number] History of Present Illness:  Theresa Young is a 62 year old white female, married.PM in for a well woman gyn exam, she had normal pap with negative HPV 02/16/17.She saw Dr Juanetta GoslingHawkins Monday for bug bite to left thigh and had labs, has heard back yet.She had aches in left leg into back then. 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  She is retired and loving it, is raising blue berries. PCP is Dr Juanetta GoslingHawkins.  Current Medications, Allergies, Past Medical History, Past Surgical History, Family History and Social History were reviewed in [facility] electronic medical record.     Review of Systems: Patient denies any headaches, hearing loss, fatigue, blurred vision, shortness of breath, chest pain, abdominal pain, problems with bowel movements, urination, or intercourse. No joint pain or mood swings.    Physical Exam:BP 134/73 (BP Location: Left Arm, Patient Position: Sitting, Cuff Size: Large)   Pulse 92   Ht 5' 7.25" (1.708 m)   Wt 257 lb 8 oz (116.8 kg)   BMI 40.03 kg/m  General:  Well developed, well nourished, no acute distress Skin:  Warm and dry Neck:  Midline trachea, thyroid absent, good ROM, no lymphadenopathy Lungs; Clear to auscultation bilaterally Breast:  No dominant palpable mass, retraction, or nipple discharge Cardiovascular: Regular rate and rhythm Abdomen:  Soft, non tender, no hepatosplenomegaly Pelvic:  External genitalia is normal in appearance, no lesions.  The vagina is normal in appearance. Urethra has no lesions or masses. The cervix is smooth.  Uterus is felt to be normal size, shape, and contour.  No adnexal masses or tenderness noted.Bladder is non tender, no masses felt. Rectal: Good sphincter tone, no polyps, or hemorrhoids felt.  Hemoccult negative. Extremities/musculoskeletal:  No swelling or varicosities noted, no clubbing or cyanosis,had bug bite left upper thigh,  resolving  Psych:  No mood changes, alert and cooperative,seems happy PHQ 2 score 0.  Impression: 1. Encounter for well woman exam with routine gynecological exam   2. Screening for colorectal cancer       Plan: Physical in 1 year Pap in 2021 Labs with PCP Mammogram 03/04/18 Colonoscopy per GI DEXA at 67 Call Dr Juanetta GoslingHawkins' office and ask if can add HIV and hepatitis C screening to blood already drawn if not can get at next labs

## 2018-02-24 ENCOUNTER — Telehealth: Payer: Self-pay | Admitting: Adult Health

## 2018-02-24 NOTE — Telephone Encounter (Signed)
I called pt to let her know I received labs from Dr Juanetta GoslingHawkins and went over results with her and she says she is getting A1c in near future FBS 104.

## 2018-05-25 ENCOUNTER — Encounter: Payer: Self-pay | Admitting: Podiatry

## 2018-05-25 ENCOUNTER — Ambulatory Visit (INDEPENDENT_AMBULATORY_CARE_PROVIDER_SITE_OTHER): Payer: PRIVATE HEALTH INSURANCE | Admitting: Podiatry

## 2018-05-25 ENCOUNTER — Other Ambulatory Visit: Payer: Self-pay | Admitting: Podiatry

## 2018-05-25 ENCOUNTER — Ambulatory Visit (INDEPENDENT_AMBULATORY_CARE_PROVIDER_SITE_OTHER): Payer: PRIVATE HEALTH INSURANCE

## 2018-05-25 VITALS — BP 124/70 | HR 77

## 2018-05-25 DIAGNOSIS — S9031XA Contusion of right foot, initial encounter: Secondary | ICD-10-CM

## 2018-05-25 DIAGNOSIS — S92344A Nondisplaced fracture of fourth metatarsal bone, right foot, initial encounter for closed fracture: Secondary | ICD-10-CM

## 2018-05-25 NOTE — Progress Notes (Signed)
She presents today chief complaint of pain to the right foot after falling from a stool 4 days ago at the [employer].  States that a bruise the back of her leg and took several hours before her foot was painful.  She states now is painful and swollen.  The back of the leg feels fine the foot is feeling some better.  Objective: Signs are stable alert and oriented x3.  Pulses are palpable.  Neurologic sensorium is intact.  No calf pain there is some ecchymosis of the posterior aspect of the calf and a mild abrasion.  No signs of infection.  Right foot does appear to be swollen and tender on palpation she particularly around the head of the fourth metatarsal area.  Radiographs taken today do demonstrate a transverse fracture right at the surgical neck of the fourth metatarsal.  Nondisplaced non-comminuted.  Assessment: Fracture fourth metatarsal right.  Plan: She has a Cam walker that she will utilize and I recommended that she stay in that for the next month I will follow-up with her at that time for another set of x-rays.

## 2018-06-24 ENCOUNTER — Other Ambulatory Visit: Payer: Self-pay | Admitting: Podiatry

## 2018-06-24 ENCOUNTER — Ambulatory Visit (INDEPENDENT_AMBULATORY_CARE_PROVIDER_SITE_OTHER): Payer: PRIVATE HEALTH INSURANCE

## 2018-06-24 ENCOUNTER — Ambulatory Visit (INDEPENDENT_AMBULATORY_CARE_PROVIDER_SITE_OTHER): Payer: PRIVATE HEALTH INSURANCE | Admitting: Podiatry

## 2018-06-24 ENCOUNTER — Encounter: Payer: Self-pay | Admitting: Podiatry

## 2018-06-24 DIAGNOSIS — S92344D Nondisplaced fracture of fourth metatarsal bone, right foot, subsequent encounter for fracture with routine healing: Secondary | ICD-10-CM

## 2018-06-24 DIAGNOSIS — S92344A Nondisplaced fracture of fourth metatarsal bone, right foot, initial encounter for closed fracture: Secondary | ICD-10-CM

## 2018-06-24 NOTE — Progress Notes (Signed)
She presents today for follow-up of a fracture to the fourth metatarsal of the right foot.  States she is been wearing her shoe as instructed.  She states that seems to be doing much better.  Objective: Vital signs are stable she is alert and oriented x3.  Minimal edema to the dorsal aspect of the right foot I am still able to palpate the bone callus around the neck of the fourth metatarsal.  Mild tenderness on palpation of this area.  Radiographs taken today demonstrate a well-healing nondisplaced non-comminuted stress fracture of the fourth metatarsal neck of the right foot.  She appears to be healing well.  Assessment: Well-healing fourth metatarsal stress fracture right.  Plan: Continue to wear the cam boot/surgical shoe for the next 2 weeks and then try a regular tennis shoe.  She does still continue to bother her she is to revert back to the [place] shoe until she is able to wear regular tennis shoe.

## 2018-11-18 ENCOUNTER — Emergency Department (HOSPITAL_COMMUNITY)
Admission: EM | Admit: 2018-11-18 | Discharge: 2018-11-18 | Disposition: A | Discharge status: Home / Self Care | Payer: PRIVATE HEALTH INSURANCE | Attending: Emergency Medicine | Admitting: Emergency Medicine

## 2018-11-18 ENCOUNTER — Encounter (HOSPITAL_COMMUNITY): Payer: Self-pay | Admitting: Emergency Medicine

## 2018-11-18 ENCOUNTER — Other Ambulatory Visit: Payer: Self-pay

## 2018-11-18 DIAGNOSIS — R112 Nausea with vomiting, unspecified: Secondary | ICD-10-CM

## 2018-11-18 DIAGNOSIS — R55 Syncope and collapse: Secondary | ICD-10-CM | POA: Diagnosis not present

## 2018-11-18 DIAGNOSIS — I10 Essential (primary) hypertension: Secondary | ICD-10-CM | POA: Diagnosis not present

## 2018-11-18 DIAGNOSIS — Z79899 Other long term (current) drug therapy: Secondary | ICD-10-CM | POA: Diagnosis not present

## 2018-11-18 DIAGNOSIS — E039 Hypothyroidism, unspecified: Secondary | ICD-10-CM | POA: Insufficient documentation

## 2018-11-18 LAB — BASIC METABOLIC PANEL
ANION GAP: 10 (ref 5–15)
BUN: 16 mg/dL (ref 6–20)
CALCIUM: 9.1 mg/dL (ref 8.9–10.3)
CO2: 25 mmol/L (ref 22–32)
CREATININE: 0.78 mg/dL (ref 0.44–1.00)
Chloride: 102 mmol/L (ref 98–111)
Glucose, Bld: 124 mg/dL — ABNORMAL HIGH (ref 70–99)
Potassium: 3.3 mmol/L — ABNORMAL LOW (ref 3.5–5.1)
SODIUM: 137 mmol/L (ref 135–145)

## 2018-11-18 LAB — URINALYSIS, ROUTINE W REFLEX MICROSCOPIC
BACTERIA UA: NONE SEEN
BILIRUBIN URINE: NEGATIVE
Glucose, UA: NEGATIVE mg/dL
KETONES UR: 5 mg/dL — AB
LEUKOCYTE UA: NEGATIVE
Nitrite: NEGATIVE
PROTEIN: NEGATIVE mg/dL
SPECIFIC GRAVITY, URINE: 1.015 (ref 1.005–1.030)
pH: 5 (ref 5.0–8.0)

## 2018-11-18 LAB — CBC WITH DIFFERENTIAL/PLATELET
Abs Immature Granulocytes: 0.06 10*3/uL (ref 0.00–0.07)
BASOS ABS: 0 10*3/uL (ref 0.0–0.1)
BASOS PCT: 0 %
EOS ABS: 0.1 10*3/uL (ref 0.0–0.5)
Eosinophils Relative: 1 %
HEMATOCRIT: 42.1 % (ref 36.0–46.0)
Hemoglobin: 14.2 g/dL (ref 12.0–15.0)
Immature Granulocytes: 1 %
LYMPHS ABS: 1 10*3/uL (ref 0.7–4.0)
Lymphocytes Relative: 7 %
MCH: 30.2 pg (ref 26.0–34.0)
MCHC: 33.7 g/dL (ref 30.0–36.0)
MCV: 89.6 fL (ref 80.0–100.0)
Monocytes Absolute: 0.7 10*3/uL (ref 0.1–1.0)
Monocytes Relative: 5 %
NEUTROS PCT: 86 %
NRBC: 0 % (ref 0.0–0.2)
Neutro Abs: 11.3 10*3/uL — ABNORMAL HIGH (ref 1.7–7.7)
Platelets: 149 10*3/uL — ABNORMAL LOW (ref 150–400)
RBC: 4.7 MIL/uL (ref 3.87–5.11)
RDW: 12.8 % (ref 11.5–15.5)
WBC: 13.2 10*3/uL — AB (ref 4.0–10.5)

## 2018-11-18 MED ORDER — SODIUM CHLORIDE 0.9 % IV BOLUS
1000.0000 mL | Freq: Once | INTRAVENOUS | Status: AC
  Administered 2018-11-18: 1000 mL via INTRAVENOUS

## 2018-11-18 MED ORDER — ONDANSETRON HCL 8 MG PO TABS: 8.0000 mg | ORAL_TABLET | Freq: Three times a day (TID) | ORAL | 0 refills | Status: DC | PRN

## 2018-11-18 MED ORDER — ONDANSETRON 4 MG PO TBDP
4.0000 mg | ORAL_TABLET | Freq: Once | ORAL | Status: AC | PRN
  Administered 2018-11-18: 4 mg via ORAL
  Filled 2018-11-18: qty 1

## 2018-11-18 NOTE — Discharge Instructions (Addendum)
Frequent sips of clear fluids, bland diet as tolerated.  Follow up with your primary care provider for recheck or return to the ER for any worsening symptoms.

## 2018-11-18 NOTE — ED Triage Notes (Signed)
Pt became nauseated after giving blood today.  Has given blood several times in the past with no problem.  Pt has had several episodes of vomiting. A&O x 4.  VS wnl, cbg 136

## 2018-11-18 NOTE — ED Provider Notes (Signed)
[facility] EMERGENCY DEPARTMENT Provider Note   CSN: [id number] Arrival date & time: 11/18/18  1634    History   Chief Complaint Chief Complaint  Patient presents with  . Emesis    HPI Theresa Young is a 63 y.o. female.     HPI   Theresa Young is a 63 y.o. female who presents to the Emergency Department complaining of sudden onset of nausea, vomiting and one brief episode of syncope.  She states that she was donating blood this afternoon and was near finished when she suddenly felt nausea, weakness and she states she remembers waking up with people were standing over her.  She was told that she "blacked out for about 15 seconds."  She has donated blood in the past without complications.  She drank juice and ate cookies afterwards, then began vomiting and reports having several episodes prior to her arrival.  She reports now feeling "weak all over". She denies symptoms prior to giving blood, no recent illness, fever, chills.  She denies abdominal pain, diarrhea, chest pain, shortness of breath, dizziness and headache.  She was recently started on Celebrex for back pain, but no other new medications.     Past Medical History:  Diagnosis Date  . Arthritis   . Depression   . GERD (gastroesophageal reflux disease)    occasional  . Headache(784.0)    migraines  . History of MRSA infection   . Hyperlipidemia   . Hypertension   . Hypothyroid 12/21/2014  . Obesity   . Sleep apnea   . Snores 12/05/2013   ?sleep apnea will get sleep study    Patient Active Problem List   Diagnosis Date Noted  . Encounter for well woman exam with routine gynecological exam 02/18/2018  . Screening for colorectal cancer 02/18/2018  . Encounter for routine gynecological examination with Papanicolaou smear of cervix 02/16/2017  . Breast abscess 07/28/2016  . Hypothyroid 12/21/2014  . Sleep apnea 12/21/2014  . Snores 12/05/2013  . MIGRAINE HEADACHE 06/24/2007  . HYPERTENSION 06/24/2007  . FX  CLOSED METATARSAL 06/24/2007    Past Surgical History:  Procedure Laterality Date  . APPENDECTOMY  6 yrs ago   Dr. Lovell Sheehan  . CYSTOSCOPY    . FOOT SURGERY Right   . GANGLION CYST EXCISION    . THYROIDECTOMY  12/10/2011   Procedure: THYROIDECTOMY;  Surgeon: Dalia Heading, MD;  Location: [facility];  Service: General;  Laterality: N/A;  Total Thyroidectomy     OB History    Gravida  0   Para  0   Term  0   Preterm  0   AB  0   Living  0     SAB  0   TAB  0   Ectopic  0   Multiple  0   Live Births               Home Medications    Prior to Admission medications   Medication Sig Start Date End Date Taking? Authorizing Provider  Calcium Carbonate Antacid (TUMS PO) Take by mouth as needed.    [provider]  eletriptan (RELPAX) 40 MG tablet One tablet by mouth at onset of headache. May repeat in 2 hours if headache persists or recurs. may repeat in 2 hours if necessary for migraines    [provider]  ibuprofen (ADVIL,MOTRIN) 200 MG tablet Take 400 mg by mouth as needed.    [provider]  Levothyroxine Sodium (SYNTHROID  PO) Take 112 mcg by mouth daily.     [provider]  losartan-hydrochlorothiazide (HYZAAR) 100-12.5 MG per tablet Take 1 tablet by mouth every morning.    [provider]  predniSONE (DELTASONE) 10 MG tablet Take 10 mg by mouth as directed.    [provider]  rosuvastatin (CRESTOR) 10 MG tablet Take 10 mg by mouth daily.     [provider]    Family History Family History  Problem Relation Age of Onset  . Hypertension Mother   . Cancer Mother        lung  . Migraines Mother   . Ulcers Mother   . Hypertension Father   . Migraines Father   . Heart disease Father   . Ulcers Father   . Early death Sister   . Diabetes Sister   . Diabetes Paternal Grandmother   . Kidney disease Paternal Grandfather        kidney failure  . Heart attack Paternal Grandfather   . Early death  Sister   . Obesity Sister   . Hypertension Sister   . Migraines Sister   . Cancer Sister        ? cervical cancer  . Multiple sclerosis Sister   . Breast cancer Sister   . Pseudochol deficiency Neg Hx   . Malignant hyperthermia Neg Hx   . Hypotension Neg Hx   . Anesthesia problems Neg Hx     Social History Social History   Tobacco Use  . Smoking status: Never Smoker  . Smokeless tobacco: Never Used  Substance Use Topics  . Alcohol use: Yes    Comment: very rarely  . Drug use: No     Allergies   Bactrim [sulfamethoxazole-trimethoprim]; Erythromycin; and Iodinated diagnostic agents   Review of Systems Review of Systems  Constitutional: Negative for appetite change, chills and fever.  Eyes: Negative for visual disturbance.  Respiratory: Negative for cough, chest tightness and shortness of breath.   Cardiovascular: Negative for chest pain.  Gastrointestinal: Positive for nausea and vomiting. Negative for abdominal pain, blood in stool and diarrhea.  Genitourinary: Negative for difficulty urinating and dysuria.  Musculoskeletal: Negative for back pain.  Skin: Negative for color change and rash.  Neurological: Positive for syncope and weakness. Negative for dizziness, seizures, facial asymmetry, numbness and headaches.  Hematological: Negative for adenopathy.  Psychiatric/Behavioral: Negative for confusion.     Physical Exam Updated Vital Signs BP (!) 141/68 (BP Location: Left Arm)   Pulse 80   Temp (!) 97.3 F (36.3 C) (Oral)   Resp 16   Ht 5\' 7"  (1.702 m)   Wt 113.4 kg   SpO2 100%   BMI 39.16 kg/m   Physical Exam Vitals signs and nursing note reviewed.  Constitutional:      Appearance: Normal appearance. She is not ill-appearing.  HENT:     Head: Atraumatic.     Mouth/Throat:     Mouth: Mucous membranes are dry.  Eyes:     Extraocular Movements: Extraocular movements intact.     Conjunctiva/sclera: Conjunctivae normal.     Pupils: Pupils are equal,  round, and reactive to light.  Neck:     Musculoskeletal: Normal range of motion. No neck rigidity.  Cardiovascular:     Rate and Rhythm: Normal rate and regular rhythm.     Pulses: Normal pulses.     Heart sounds: No murmur.  Pulmonary:     Effort: Pulmonary effort is normal. No respiratory distress.  Breath sounds: Normal breath sounds.  Abdominal:     General: There is no distension.     Palpations: Abdomen is soft.     Tenderness: There is no abdominal tenderness.  Lymphadenopathy:     Cervical: No cervical adenopathy.  Neurological:     General: No focal deficit present.     Mental Status: She is alert.     GCS: GCS eye subscore is 4. GCS verbal subscore is 5. GCS motor subscore is 6.     Sensory: Sensation is intact.     Motor: Motor function is intact. No weakness.     Coordination: Coordination is intact.     Comments: Speech clear, no pronator drift.  CN II-XII intact      ED Treatments / Results  Labs (all labs ordered are listed, but only abnormal results are displayed) Labs Reviewed  BASIC METABOLIC PANEL - Abnormal; Notable for the following components:      Result Value   Potassium 3.3 (*)    Glucose, Bld 124 (*)    All other components within normal limits  CBC WITH DIFFERENTIAL/PLATELET - Abnormal; Notable for the following components:   WBC 13.2 (*)    Platelets 149 (*)    Neutro Abs 11.3 (*)    All other components within normal limits  URINALYSIS, ROUTINE W REFLEX MICROSCOPIC - Abnormal; Notable for the following components:   Hgb urine dipstick MODERATE (*)    Ketones, ur 5 (*)    All other components within normal limits    EKG None  Radiology No results found.  Procedures Procedures (including critical care time)  Medications Ordered in ED Medications  sodium chloride 0.9 % bolus 1,000 mL (has no administration in time range)  ondansetron (ZOFRAN-ODT) disintegrating tablet 4 mg (4 mg Oral Given 11/18/18 1644)     Initial  Impression / Assessment and Plan / ED Course  I have reviewed the triage vital signs and the nursing notes.  Pertinent labs & imaging results that were available during my care of the patient were reviewed by me and considered in my medical decision making (see chart for details).         Pt with likely vasovagal syncope after donating blood.  Few episodes of vomiting after attempting to eat cookies and drink fluids.  Labs here are reassuring and she reports feeling much better after IVF's.  She has ambulated in the dept w/o difficulty, gait is steady.  No focal neuro deficits.   She has tolerated oral fluid challenge and ate crackers.  States she is ready for d/c home.  Return precautions discussed.     Final Clinical Impressions(s) / ED Diagnoses   Final diagnoses:  Vasovagal syncope  Non-intractable vomiting with nausea, unspecified vomiting type    ED Discharge Orders    None       Pauline Aus, PA-C 11/19/18 0132    Vanetta Mulders, MD 12/01/18 [phone]

## 2018-12-30 ENCOUNTER — Telehealth: Payer: Self-pay | Admitting: Adult Health

## 2018-12-30 NOTE — Telephone Encounter (Signed)
Pt states that [facility] advised her to reach out to [place] to renew her rx for CPAP supplies.

## 2018-12-30 NOTE — Telephone Encounter (Signed)
Pt advised to talk to Dr. Juanetta Gosling regarding CPAP supplies. Pt voiced understanding. JSY

## 2019-04-26 DIAGNOSIS — E559 Vitamin D deficiency, unspecified: Secondary | ICD-10-CM

## 2019-04-26 HISTORY — DX: Vitamin D deficiency, unspecified: E55.9

## 2019-05-10 ENCOUNTER — Ambulatory Visit (INDEPENDENT_AMBULATORY_CARE_PROVIDER_SITE_OTHER): Payer: PRIVATE HEALTH INSURANCE | Admitting: Podiatry

## 2019-05-10 ENCOUNTER — Ambulatory Visit (INDEPENDENT_AMBULATORY_CARE_PROVIDER_SITE_OTHER): Payer: PRIVATE HEALTH INSURANCE

## 2019-05-10 ENCOUNTER — Other Ambulatory Visit: Payer: Self-pay

## 2019-05-10 ENCOUNTER — Encounter: Payer: Self-pay | Admitting: Podiatry

## 2019-05-10 DIAGNOSIS — S9032XA Contusion of left foot, initial encounter: Secondary | ICD-10-CM

## 2019-05-10 DIAGNOSIS — S93602A Unspecified sprain of left foot, initial encounter: Secondary | ICD-10-CM

## 2019-05-11 ENCOUNTER — Encounter: Payer: Self-pay | Admitting: Podiatry

## 2019-05-11 NOTE — Progress Notes (Signed)
She presents today chief complaint of pain to the anterior lateral aspect of the foot states that she twisted her foot about 5 days ago or last Thursday she states that it is aching and swelling she is tried ice and ibuprofen  Objective: Vital signs are stable alert and oriented x3.  Pulses are palpable.  Neurologic sensorium is intact dT reflexes are intact muscle strength is normal symmetrical.  She has pain on palpation of the calcaneocuboid joint.  Radiographs taken today do not demonstrate any fracture in this area however does demonstrate some spurring most likely secondary to some mild pes planus.  Assessment: Probable sprain of the lateral aspect of the left foot.  Plan: She is going to start wearing some more stable shoe gear get out of the flip-flops and use more of a tennis shoe type device and I will follow-up with her in 1 month if necessary.  She did not want an injection today she declined

## 2019-05-25 LAB — CBC AND DIFFERENTIAL
HCT: 42 (ref 36–46)
Hemoglobin: 14.1 (ref 12.0–16.0)
Neutrophils Absolute: 3822
Platelets: 13 — AB (ref 150–399)
WBC: 6

## 2019-05-25 LAB — BASIC METABOLIC PANEL
BUN: 13 (ref 4–21)
Creatinine: 0.9 (ref 0.5–1.1)
Glucose: 121

## 2019-05-25 LAB — HEPATIC FUNCTION PANEL
ALT: 25 (ref 7–35)
AST: 24 (ref 13–35)
Alkaline Phosphatase: 65 (ref 25–125)
Bilirubin, Total: 2.6

## 2019-05-25 LAB — LIPID PANEL
Cholesterol: 154 (ref 0–200)
HDL: 45 (ref 35–70)
LDL Cholesterol: 81
Triglycerides: 187 — AB (ref 40–160)

## 2019-05-25 LAB — TSH: TSH: 3.37 (ref 0.41–5.90)

## 2019-05-25 LAB — HEMOGLOBIN A1C: Hemoglobin A1C: 5.6

## 2019-05-25 LAB — CBC: RBC: 4.72 (ref 3.87–5.11)

## 2019-07-19 ENCOUNTER — Ambulatory Visit (INDEPENDENT_AMBULATORY_CARE_PROVIDER_SITE_OTHER): Payer: PRIVATE HEALTH INSURANCE | Admitting: Adult Health

## 2019-07-19 ENCOUNTER — Other Ambulatory Visit: Payer: Self-pay

## 2019-07-19 ENCOUNTER — Encounter: Payer: Self-pay | Admitting: Adult Health

## 2019-07-19 VITALS — BP 146/90 | HR 76 | Ht 67.0 in | Wt 252.5 lb

## 2019-07-19 DIAGNOSIS — Z01419 Encounter for gynecological examination (general) (routine) without abnormal findings: Secondary | ICD-10-CM

## 2019-07-19 DIAGNOSIS — Z1211 Encounter for screening for malignant neoplasm of colon: Secondary | ICD-10-CM

## 2019-07-19 DIAGNOSIS — Z1212 Encounter for screening for malignant neoplasm of rectum: Secondary | ICD-10-CM

## 2019-07-19 LAB — HEMOCCULT GUIAC POC 1CARD (OFFICE): Fecal Occult Blood, POC: NEGATIVE

## 2019-07-19 NOTE — Progress Notes (Signed)
Patient ID: Theresa Young, female   DOB: Jun 10, 1956, 63 y.o.   MRN: [id number] History of Present Illness: Theresa Young is a 63 year old white female, married, PM, in for a well woman gyn exam, she had a normal pap with negative HPV 02/16/17. She had episode this spring where she was giving blood and fainted, and K+ was low. 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  PCP is Dr Theresa Young.   Current Medications, Allergies, Past Medical History, Past Surgical History, Family History and Social History were reviewed in [employer] record.     Review of Systems: Patient denies any headaches, hearing loss, fatigue, blurred vision, shortness of breath, chest pain, abdominal pain, problems with bowel movements, urination, or intercourse(not active as much). No joint pain or mood swings.    Physical Exam:BP (!) 146/90 (BP Location: Left Arm, Patient Position: Sitting, Cuff Size: Normal)   Pulse 76   Ht 5\' 7"  (1.702 m)   Wt 252 lb 8 oz (114.5 kg)   BMI 39.55 kg/m   General:  Well developed, well nourished, no acute distress Skin:  Warm and dry Neck:  Midline trachea, normal thyroid, good ROM, no lymphadenopathy,no carotid bruits heard Lungs; Clear to auscultation bilaterally Breast:  No dominant palpable mass, retraction, or nipple discharge Cardiovascular: Regular rate and rhythm Abdomen:  Soft, non tender, no hepatosplenomegaly Pelvic:  External genitalia is normal in appearance, no lesions.  The vagina is pale with loss of moisture and rugae. Urethra has no lesions or masses. The cervix is smooth.  Uterus is felt to be normal size, shape, and contour.  No adnexal masses or tenderness noted.Bladder is non tender, no masses felt. Rectal: Good sphincter tone, no polyps, or hemorrhoids felt.  Hemoccult negative. Extremities/musculoskeletal:  No swelling or varicosities noted, no clubbing or cyanosis Psych:  No mood changes, alert and cooperative,seems happy Fall risk is  low PHQ 2 score 1. Co exam with Theresa Croon FNP student.  Impression and plan: 1. Encounter for well woman exam with routine gynecological exam Pap and physical in 1 year Mammogram in February 2021 Labs with PCP   2. Screening for colorectal cancer Hemoccult was negative Colonoscopy per GI

## 2019-08-05 ENCOUNTER — Other Ambulatory Visit: Payer: Self-pay

## 2019-08-05 ENCOUNTER — Encounter: Payer: Self-pay | Admitting: Family Medicine

## 2019-08-05 ENCOUNTER — Ambulatory Visit: Payer: PRIVATE HEALTH INSURANCE | Admitting: Family Medicine

## 2019-08-05 VITALS — BP 116/82 | HR 95 | Temp 98.4°F | Resp 16 | Ht 67.0 in | Wt 257.2 lb

## 2019-08-05 DIAGNOSIS — E7800 Pure hypercholesterolemia, unspecified: Secondary | ICD-10-CM

## 2019-08-05 DIAGNOSIS — I10 Essential (primary) hypertension: Secondary | ICD-10-CM | POA: Diagnosis not present

## 2019-08-05 DIAGNOSIS — Z0000 Encounter for general adult medical examination without abnormal findings: Secondary | ICD-10-CM

## 2019-08-05 DIAGNOSIS — R7301 Impaired fasting glucose: Secondary | ICD-10-CM

## 2019-08-05 DIAGNOSIS — E890 Postprocedural hypothyroidism: Secondary | ICD-10-CM

## 2019-08-05 NOTE — Progress Notes (Signed)
Office Note 08/05/2019  CC:  Chief Complaint  Patient presents with  . Establish Care    Previous PCP, Dr.Hawkins    HPI:  Theresa Young is a 63 y.o. White female who is here to establish care. Patient's most recent primary MD: Dr. Jaquita Rector Hawkins->retired. Old records in EPIC/[facility] EMR were reviewed prior to or during today's visit.  Most recent pap 02/16/17, gets this via her GYN MD q 3 yrs.  Had appt 07/19/19. Has mammogram scheduled for 09/26/19. Colon ca screening: normal colonoscopy approx 2012 (Dr. Collene Mares). Flu vaccine UTD.  Donates blood about 3 times per year. She is unsure when she had her most recent panel of labs but thinks it may have been back in Aug or Sept.  Past Medical History:  Diagnosis Date  . Carpal tunnel syndrome   . Depression   . History of MRSA infection    R hand soft tissue  . Hyperlipidemia   . Hypertension   . Migraine syndrome    occasional  . Obesity, Class II, BMI 35-39.9   . OSA on CPAP 11/2013   severe; needs compliance monitoring->referred to neurologist ([facility])  . Osteoarthritis    Knees, ankles, fingers, wrists, sometimes hips.  Occ prn NSAID  . 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 APPENDECTOMY  6 yrs ago   Dr. Arnoldo Morale  . COLONOSCOPY     approx 2012, normal.  . CPAP  2015  . CYSTOSCOPY     for pain in side; normal  . FOOT SURGERY Right    4th metatarsal bone fx  . GANGLION CYST EXCISION    . polysomnogram  11/2013   severe OSA  . THYROIDECTOMY  12/10/2011   Bx benign.  Enlarging goiter.  Procedure: THYROIDECTOMY;  Surgeon: Jamesetta So, MD;  Location: [facility];  Service: General;  Laterality: N/A;  Total Thyroidectomy  . TRANSTHORACIC ECHOCARDIOGRAM  05/17/2015   EF 65-70%, mod LVH, normal wall motion, grd I DD, mild MR.    Family History  Problem Relation Age of Onset  . Hypertension Mother   . Cancer Mother        lung  . Migraines Mother   . Ulcers Mother   .  Hypertension Father   . Migraines Father   . Heart disease Father   . Ulcers Father   . Early death Sister   . Diabetes Sister   . Diabetes Paternal Grandmother   . Kidney disease Paternal Grandfather        kidney failure  . Heart attack Paternal Grandfather   . Early death Sister   . Obesity Sister   . Hypertension Sister   . Migraines Sister   . Cancer Sister        ? cervical cancer  . Multiple sclerosis Sister   . Breast cancer Sister   . Pseudochol deficiency Neg Hx   . Malignant hyperthermia Neg Hx   . Hypotension Neg Hx   . Anesthesia problems Neg Hx     Social History   Socioeconomic History  . Marital status: Married    Spouse name: Not on file  . Number of children: Not on file  . Years of education: Not on file  . Highest education level: Not on file  Occupational History  . Not on file  Tobacco Use  . Smoking status: Never Smoker  . Smokeless tobacco: Never Used  Substance and Sexual Activity  . Alcohol use:  Yes    Comment: very rarely  . Drug use: No  . Sexual activity: Yes    Birth control/protection: Post-menopausal  Other Topics Concern  . Not on file  Social History Narrative   Married, 3 step children.  She raised her nephew who has special needs.   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   No tob.   Social Determinants of Health   Financial Resource Strain:   . Difficulty of Paying Living Expenses: Not on file  Food Insecurity:   . Worried About Programme researcher, broadcasting/film/videounning Out of Food in the Last Year: Not on file  . Ran Out of Food in the Last Year: Not on file  Transportation Needs:   . Lack of Transportation (Medical): Not on file  . Lack of Transportation (Non-Medical): Not on file  Physical Activity:   . Days of Exercise per Week: Not on file  . Minutes of Exercise per Session: Not on file  Stress:   . Feeling of Stress : Not on file  Social Connections:   . Frequency of Communication with Friends and Family:  Not on file  . Frequency of Social Gatherings with Friends and Family: Not on file  . Attends Religious Services: Not on file  . Active Member of Clubs or Organizations: Not on file  . Attends BankerClub or Organization Meetings: Not on file  . Marital Status: Not on file  Intimate Partner Violence:   . Fear of Current or Ex-Partner: Not on file  . Emotionally Abused: Not on file  . Physically Abused: Not on file  . Sexually Abused: Not on file    Outpatient Encounter Medications as of 08/05/2019  Medication Sig  . Cholecalciferol (VITAMIN D) 50 MCG (2000 UT) CAPS Take by mouth daily.   . famotidine (PEPCID) 20 MG tablet   . ibuprofen (ADVIL,MOTRIN) 200 MG tablet Take 400 mg by mouth as needed.  Marland Kitchen. levothyroxine (SYNTHROID) 112 MCG tablet   . losartan-hydrochlorothiazide (HYZAAR) 100-12.5 MG per tablet Take 1 tablet by mouth every morning.  . potassium chloride SA (KLOR-CON) 20 MEQ tablet Take 20 mEq by mouth daily.  . rosuvastatin (CRESTOR) 10 MG tablet Take 10 mg by mouth daily.   Marland Kitchen. eletriptan (RELPAX) 40 MG tablet One tablet by mouth at onset of headache. May repeat in 2 hours if headache persists or recurs. may repeat in 2 hours if necessary for migraines   No facility-administered encounter medications on file as of 08/05/2019.    Allergies  Allergen Reactions  . Bactrim [Sulfamethoxazole-Trimethoprim] Nausea And Vomiting  . Erythromycin Hives    REACTION: rash on trunk of body  . Iodinated Diagnostic Agents Swelling and Other (See Comments)    Reaction: itching,swelling around the eyes    ROS Review of Systems  Constitutional: Negative for appetite change, chills, fatigue and fever.  HENT: Negative for congestion, dental problem, ear pain and sore throat.   Eyes: Negative for discharge, redness and visual disturbance.  Respiratory: Negative for cough, chest tightness, shortness of breath and wheezing.   Cardiovascular: Negative for chest pain, palpitations and leg swelling.   Gastrointestinal: Negative for abdominal pain, blood in stool, diarrhea, nausea and vomiting.  Genitourinary: Negative for difficulty urinating, dysuria, flank pain, frequency, hematuria and urgency.  Musculoskeletal: Negative for arthralgias, back pain, joint swelling, myalgias and neck stiffness.  Skin: Negative for pallor and rash.  Neurological: Negative for dizziness, speech difficulty, weakness and headaches.  Hematological: Negative for adenopathy.  Does not bruise/bleed easily.  Psychiatric/Behavioral: Negative for confusion and sleep disturbance. The patient is not nervous/anxious.     PE; Blood pressure 116/82, pulse 95, temperature 98.4 F (36.9 C), temperature source Temporal, resp. rate 16, height 5\' 7"  (1.702 m), weight 257 lb 3.2 oz (116.7 kg), SpO2 96 %. Body mass index is 40.28 kg/m. Exam chaperoned by , RN Gen: Alert, well appearing.  Patient is oriented to person, place, time, and situation. AFFECT: pleasant, lucid thought and speech. ENT: Ears: EACs clear, normal epithelium.  TMs with good light reflex and landmarks bilaterally.  Eyes: no injection, icteris, swelling, or exudate.  EOMI, PERRLA. Nose: no drainage or turbinate edema/swelling.  No injection or focal lesion.  Mouth: lips without lesion/swelling.  Oral mucosa pink and moist.  Dentition intact and without obvious caries or gingival swelling.  Oropharynx without erythema, exudate, or swelling.  Neck: supple/nontender.  No LAD, mass, or TM.  Carotid pulses 2+ bilaterally, without bruits. CV: RRR, no m/r/g.   LUNGS: CTA bilat, nonlabored resps, good aeration in all lung fields. ABD: soft, NT, ND, BS normal.  No hepatospenomegaly or mass.  No bruits. EXT: no clubbing, cyanosis, or edema.  Musculoskeletal: no joint swelling, erythema, warmth, or tenderness.  ROM of all joints intact. Skin - no sores or suspicious lesions or rashes or color changes   Pertinent labs:  Lab Results  Component Value Date    TSH 3.600 12/21/2014   Lab Results  Component Value Date   WBC 13.2 (H) 11/18/2018   HGB 14.2 11/18/2018   HCT 42.1 11/18/2018   MCV 89.6 11/18/2018   PLT 149 (L) 11/18/2018   Lab Results  Component Value Date   CREATININE 0.78 11/18/2018   BUN 16 11/18/2018   NA 137 11/18/2018   K 3.3 (L) 11/18/2018   CL 102 11/18/2018   CO2 25 11/18/2018   Lab Results  Component Value Date   ALT 23 12/11/2011   AST 24 12/11/2011   ALKPHOS 51 12/11/2011   BILITOT 0.3 12/11/2011    ASSESSMENT AND PLAN:   New pt:  Health maintenance exam: Reviewed age and gender appropriate health maintenance issues (prudent diet, regular exercise, health risks of tobacco and excessive alcohol, use of seatbelts, fire alarms in home, use of sunscreen).  Also reviewed age and gender appropriate health screening as well as vaccine recommendations. Vaccines: all UTD. Labs: future fasting HP and A1c (IFG) Cervical ca screening: most recent Pap 2018, next due 2021 and will be done at GYN MD office. Breast ca screening: screening mammo scheduled for 09/2019 at GYN office. Osteoporosis screening: via GYN Colon ca screening: next colonoscopy due about 2 yrs (Dr. 10/2019).  No problem-specific Assessment & Plan notes found for this encounter.  An After Visit Summary was printed and given to the patient.  Return in about 6 months (around 02/03/2020) for routine chronic illness f/u.  Signed:  04/04/2020, MD           08/05/2019

## 2019-08-05 NOTE — Patient Instructions (Signed)
Health Maintenance, Female Adopting a healthy lifestyle and getting preventive care are important in promoting health and wellness. Ask your health care provider about:  The right schedule for you to have regular tests and exams.  Things you can do on your own to prevent diseases and keep yourself healthy. What should I know about diet, weight, and exercise? Eat a healthy diet   Eat a diet that includes plenty of vegetables, fruits, low-fat dairy products, and lean protein.  Do not eat a lot of foods that are high in solid fats, added sugars, or sodium. Maintain a healthy weight Body mass index (BMI) is used to identify weight problems. It estimates body fat based on height and weight. Your health care provider can help determine your BMI and help you achieve or maintain a healthy weight. Get regular exercise Get regular exercise. This is one of the most important things you can do for your health. Most adults should:  Exercise for at least 150 minutes each week. The exercise should increase your heart rate and make you sweat (moderate-intensity exercise).  Do strengthening exercises at least twice a week. This is in addition to the moderate-intensity exercise.  Spend less time sitting. Even light physical activity can be beneficial. Watch cholesterol and blood lipids Have your blood tested for lipids and cholesterol at 61 years of age, then have this test every 5 years. Have your cholesterol levels checked more often if:  Your lipid or cholesterol levels are high.  You are older than 61 years of age.  You are at high risk for heart disease. What should I know about cancer screening? Depending on your health history and family history, you may need to have cancer screening at various ages. This may include screening for:  Breast cancer.  Cervical cancer.  Colorectal cancer.  Skin cancer.  Lung cancer. What should I know about heart disease, diabetes, and high blood  pressure? Blood pressure and heart disease  High blood pressure causes heart disease and increases the risk of stroke. This is more likely to develop in people who have high blood pressure readings, are of African descent, or are overweight.  Have your blood pressure checked: ? Every 3-5 years if you are 18-39 years of age. ? Every year if you are 40 years old or older. Diabetes Have regular diabetes screenings. This checks your fasting blood sugar level. Have the screening done:  Once every three years after age 40 if you are at a normal weight and have a low risk for diabetes.  More often and at a younger age if you are overweight or have a high risk for diabetes. What should I know about preventing infection? Hepatitis B If you have a higher risk for hepatitis B, you should be screened for this virus. Talk with your health care provider to find out if you are at risk for hepatitis B infection. Hepatitis C Testing is recommended for:  Everyone born from 1945 through 1965.  Anyone with known risk factors for hepatitis C. Sexually transmitted infections (STIs)  Get screened for STIs, including gonorrhea and chlamydia, if: ? You are sexually active and are younger than 61 years of age. ? You are older than 61 years of age and your health care provider tells you that you are at risk for this type of infection. ? Your sexual activity has changed since you were last screened, and you are at increased risk for chlamydia or gonorrhea. Ask your health care provider if   you are at risk.  Ask your health care provider about whether you are at high risk for HIV. Your health care provider may recommend a prescription medicine to help prevent HIV infection. If you choose to take medicine to prevent HIV, you should first get tested for HIV. You should then be tested every 3 months for as long as you are taking the medicine. Pregnancy  If you are about to stop having your period (premenopausal) and  you may become pregnant, seek counseling before you get pregnant.  Take 400 to 800 micrograms (mcg) of folic acid every day if you become pregnant.  Ask for birth control (contraception) if you want to prevent pregnancy. Osteoporosis and menopause Osteoporosis is a disease in which the bones lose minerals and strength with aging. This can result in bone fractures. If you are 65 years old or older, or if you are at risk for osteoporosis and fractures, ask your health care provider if you should:  Be screened for bone loss.  Take a calcium or vitamin D supplement to lower your risk of fractures.  Be given hormone replacement therapy (HRT) to treat symptoms of menopause. Follow these instructions at home: Lifestyle  Do not use any products that contain nicotine or tobacco, such as cigarettes, e-cigarettes, and chewing tobacco. If you need help quitting, ask your health care provider.  Do not use street drugs.  Do not share needles.  Ask your health care provider for help if you need support or information about quitting drugs. Alcohol use  Do not drink alcohol if: ? Your health care provider tells you not to drink. ? You are pregnant, may be pregnant, or are planning to become pregnant.  If you drink alcohol: ? Limit how much you use to 0-1 drink a day. ? Limit intake if you are breastfeeding.  Be aware of how much alcohol is in your drink. In the [place] one drink equals one 12 oz bottle of beer (355 mL), one 5 oz glass of wine (148 mL), or one 1 oz glass of hard liquor (44 mL). General instructions  Schedule regular health, dental, and eye exams.  Stay current with your vaccines.  Tell your health care provider if: ? You often feel depressed. ? You have ever been abused or do not feel safe at home. Summary  Adopting a healthy lifestyle and getting preventive care are important in promoting health and wellness.  Follow your health care provider's instructions about healthy  diet, exercising, and getting tested or screened for diseases.  Follow your health care provider's instructions on monitoring your cholesterol and blood pressure. This information is not intended to replace advice given to you by your health care provider. Make sure you discuss any questions you have with your health care provider. Document Released: 02/24/2011 Document Revised: 08/04/2018 Document Reviewed: 08/04/2018 Elsevier Patient Education  2020 Elsevier Inc.  

## 2019-08-11 ENCOUNTER — Other Ambulatory Visit: Payer: Self-pay

## 2019-08-11 ENCOUNTER — Ambulatory Visit (INDEPENDENT_AMBULATORY_CARE_PROVIDER_SITE_OTHER): Payer: PRIVATE HEALTH INSURANCE | Admitting: Family Medicine

## 2019-08-11 DIAGNOSIS — R7301 Impaired fasting glucose: Secondary | ICD-10-CM | POA: Diagnosis not present

## 2019-08-11 DIAGNOSIS — E7800 Pure hypercholesterolemia, unspecified: Secondary | ICD-10-CM

## 2019-08-11 DIAGNOSIS — I10 Essential (primary) hypertension: Secondary | ICD-10-CM

## 2019-08-11 DIAGNOSIS — E890 Postprocedural hypothyroidism: Secondary | ICD-10-CM

## 2019-08-11 LAB — CBC WITH DIFFERENTIAL/PLATELET
Basophils Absolute: 0.1 10*3/uL (ref 0.0–0.1)
Basophils Relative: 0.9 % (ref 0.0–3.0)
Eosinophils Absolute: 0.3 10*3/uL (ref 0.0–0.7)
Eosinophils Relative: 4.6 % (ref 0.0–5.0)
HCT: 41.1 % (ref 36.0–46.0)
Hemoglobin: 14.1 g/dL (ref 12.0–15.0)
Lymphocytes Relative: 26.1 % (ref 12.0–46.0)
Lymphs Abs: 1.6 10*3/uL (ref 0.7–4.0)
MCHC: 34.3 g/dL (ref 30.0–36.0)
MCV: 88.7 fl (ref 78.0–100.0)
Monocytes Absolute: 0.5 10*3/uL (ref 0.1–1.0)
Monocytes Relative: 8.2 % (ref 3.0–12.0)
Neutro Abs: 3.7 10*3/uL (ref 1.4–7.7)
Neutrophils Relative %: 60.2 % (ref 43.0–77.0)
Platelets: 172 10*3/uL (ref 150.0–400.0)
RBC: 4.64 Mil/uL (ref 3.87–5.11)
RDW: 13 % (ref 11.5–15.5)
WBC: 6.2 10*3/uL (ref 4.0–10.5)

## 2019-08-11 LAB — COMPREHENSIVE METABOLIC PANEL
ALT: 23 U/L (ref 0–35)
AST: 22 U/L (ref 0–37)
Albumin: 4.4 g/dL (ref 3.5–5.2)
Alkaline Phosphatase: 60 U/L (ref 39–117)
BUN: 12 mg/dL (ref 6–23)
CO2: 30 mEq/L (ref 19–32)
Calcium: 9.6 mg/dL (ref 8.4–10.5)
Chloride: 104 mEq/L (ref 96–112)
Creatinine, Ser: 0.84 mg/dL (ref 0.40–1.20)
GFR: 68.78 mL/min (ref >60.00)
Glucose, Bld: 109 mg/dL — ABNORMAL HIGH (ref 70–99)
Potassium: 3.9 mEq/L (ref 3.5–5.1)
Sodium: 140 mEq/L (ref 135–145)
Total Bilirubin: 1 mg/dL (ref 0.2–1.2)
Total Protein: 6.1 g/dL (ref 6.0–8.3)

## 2019-08-11 LAB — LIPID PANEL
Cholesterol: 135 mg/dL (ref 0–200)
HDL: 54.1 mg/dL (ref >39.00)
LDL Cholesterol: 61 mg/dL (ref 0–99)
NonHDL: 81.18
Total CHOL/HDL Ratio: 3
Triglycerides: 102 mg/dL (ref 0.0–149.0)
VLDL: 20.4 mg/dL (ref 0.0–40.0)

## 2019-08-11 LAB — TSH: TSH: 2.5 u[IU]/mL (ref 0.35–4.50)

## 2019-08-11 LAB — HEMOGLOBIN A1C: Hgb A1c MFr Bld: 5.5 % (ref 4.6–6.5)

## 2019-08-26 DIAGNOSIS — R109 Unspecified abdominal pain: Secondary | ICD-10-CM

## 2019-08-26 HISTORY — DX: Unspecified abdominal pain: R10.9

## 2019-09-16 ENCOUNTER — Encounter: Payer: Self-pay | Admitting: Family Medicine

## 2019-10-11 ENCOUNTER — Encounter: Payer: Self-pay | Admitting: Family Medicine

## 2019-10-28 ENCOUNTER — Ambulatory Visit: Payer: PRIVATE HEALTH INSURANCE | Attending: Internal Medicine

## 2019-10-28 DIAGNOSIS — Z23 Encounter for immunization: Secondary | ICD-10-CM | POA: Insufficient documentation

## 2019-10-28 NOTE — Progress Notes (Signed)
   Covid-19 Vaccination Clinic  Name:  Theresa Young    MRN: [id number] DOB: [birth year]  10/28/2019  [name] was observed post Covid-19 immunization for 15 minutes without incident. She was provided with Vaccine Information Sheet and instruction to access the V-Safe system.   [name] was instructed to call 911 with any severe reactions post vaccine: Marland Kitchen Difficulty breathing  . Swelling of face and throat  . A fast heartbeat  . A bad rash all over body  . Dizziness and weakness   Immunizations Administered    Name Date Dose VIS Date Route   Moderna COVID-19 Vaccine 10/28/2019  8:56 AM 0.5 mL 07/26/2019 Intramuscular   Manufacturer: Moderna   Lot: [id number]   NDC: 31438-887-57

## 2019-11-30 ENCOUNTER — Ambulatory Visit: Payer: PRIVATE HEALTH INSURANCE | Attending: Internal Medicine

## 2019-11-30 DIAGNOSIS — Z23 Encounter for immunization: Secondary | ICD-10-CM

## 2019-11-30 NOTE — Progress Notes (Signed)
   Covid-19 Vaccination Clinic  Name:  Theresa Young    MRN: [id number] DOB: 15-Aug-1956  11/30/2019  [name] was observed post Covid-19 immunization for 30 minutes based on pre-vaccination screening without incident. She was provided with Vaccine Information Sheet and instruction to access the V-Safe system.   [name] was instructed to call 911 with any severe reactions post vaccine: Marland Kitchen Difficulty breathing  . Swelling of face and throat  . A fast heartbeat  . A bad rash all over body  . Dizziness and weakness   Immunizations Administered    Name Date Dose VIS Date Route   Moderna COVID-19 Vaccine 11/30/2019  8:40 AM 0.5 mL 07/26/2019 Intramuscular   Manufacturer: Gala Murdoch   Lot: [id number]   NDC: 84730-856-94

## 2019-12-28 ENCOUNTER — Other Ambulatory Visit: Payer: Self-pay | Admitting: Family Medicine

## 2020-01-02 ENCOUNTER — Telehealth: Payer: Self-pay

## 2020-01-02 NOTE — Telephone Encounter (Signed)
Patient had diarrhea,watery stools over weekend, gone now. She is now dealing with stomach cramps.  Is there anything she can do to help with stomach cramps?    [phone]

## 2020-01-02 NOTE — Telephone Encounter (Signed)
Patient advised of recommendations, voiced understanding.    Patient would also like to know if she is to continue to take potassium tablets?

## 2020-01-02 NOTE — Telephone Encounter (Signed)
Liquid diet today, start "BRAT" (examples are bananas, rice, applesauce, toast) diet this evening around supper. If doing much better then gradually get back to regular diet tomorrow.

## 2020-01-02 NOTE — Telephone Encounter (Signed)
Please advise, thanks.

## 2020-01-02 NOTE — Telephone Encounter (Signed)
Can stop taking potassium pills until she feels back to normal and is eating normally again.

## 2020-01-03 NOTE — Telephone Encounter (Signed)
Patient advised of recommendations, voiced understanding.

## 2020-02-20 LAB — HM COLONOSCOPY

## 2020-02-22 ENCOUNTER — Encounter: Payer: Self-pay | Admitting: Family Medicine

## 2020-02-22 ENCOUNTER — Other Ambulatory Visit: Payer: Self-pay | Admitting: Family Medicine

## 2020-02-28 ENCOUNTER — Encounter: Payer: Self-pay | Admitting: Family Medicine

## 2020-03-21 ENCOUNTER — Encounter: Payer: Self-pay | Admitting: Family Medicine

## 2020-04-03 ENCOUNTER — Other Ambulatory Visit: Payer: Self-pay | Admitting: Family Medicine

## 2020-04-03 NOTE — Telephone Encounter (Signed)
Rx filled for 30 days. Scheduled appointment for 04/06/20. Further refills pending appointment

## 2020-04-06 ENCOUNTER — Other Ambulatory Visit: Payer: Self-pay

## 2020-04-06 ENCOUNTER — Ambulatory Visit (INDEPENDENT_AMBULATORY_CARE_PROVIDER_SITE_OTHER): Payer: PRIVATE HEALTH INSURANCE | Admitting: Family Medicine

## 2020-04-06 ENCOUNTER — Encounter: Payer: Self-pay | Admitting: Family Medicine

## 2020-04-06 VITALS — BP 110/80 | HR 80 | Temp 98.2°F | Resp 16 | Ht 67.0 in | Wt 256.4 lb

## 2020-04-06 DIAGNOSIS — R7301 Impaired fasting glucose: Secondary | ICD-10-CM | POA: Diagnosis not present

## 2020-04-06 DIAGNOSIS — E7800 Pure hypercholesterolemia, unspecified: Secondary | ICD-10-CM

## 2020-04-06 DIAGNOSIS — E559 Vitamin D deficiency, unspecified: Secondary | ICD-10-CM | POA: Diagnosis not present

## 2020-04-06 DIAGNOSIS — I10 Essential (primary) hypertension: Secondary | ICD-10-CM

## 2020-04-06 DIAGNOSIS — E890 Postprocedural hypothyroidism: Secondary | ICD-10-CM

## 2020-04-06 LAB — LIPID PANEL
Cholesterol: 134 mg/dL (ref 0–200)
HDL: 57.1 mg/dL (ref >39.00)
LDL Cholesterol: 50 mg/dL (ref 0–99)
NonHDL: 77.13
Total CHOL/HDL Ratio: 2
Triglycerides: 134 mg/dL (ref 0.0–149.0)
VLDL: 26.8 mg/dL (ref 0.0–40.0)

## 2020-04-06 LAB — BASIC METABOLIC PANEL
BUN: 14 mg/dL (ref 6–23)
CO2: 26 mEq/L (ref 19–32)
Calcium: 9.9 mg/dL (ref 8.4–10.5)
Chloride: 106 mEq/L (ref 96–112)
Creatinine, Ser: 0.87 mg/dL (ref 0.40–1.20)
GFR: 65.91 mL/min (ref >60.00)
Glucose, Bld: 105 mg/dL — ABNORMAL HIGH (ref 70–99)
Potassium: 3.5 mEq/L (ref 3.5–5.1)
Sodium: 142 mEq/L (ref 135–145)

## 2020-04-06 LAB — VITAMIN D 25 HYDROXY (VIT D DEFICIENCY, FRACTURES): VITD: 23.62 ng/mL — ABNORMAL LOW (ref 30.00–100.00)

## 2020-04-06 LAB — TSH: TSH: 0.83 u[IU]/mL (ref 0.35–4.50)

## 2020-04-06 NOTE — Progress Notes (Signed)
OFFICE VISIT  04/06/2020   CC:  Chief Complaint  Patient presents with  . Follow-up    RCI, pt is fasting   HPI:    Patient is a 64 y.o. Caucasian female who presents for f/u HTN, postsurg hypothyroidism, HLD, and IFG. I last saw her 8 mo ago for her visit to establish care/CPE at which time everything was stable. She is feeling well.  Has not been taking potassium lately b/c ? Stomach upset from it 2 mo ago or so. Not taking vit D regularly either lately.  No home bp monitoring.  Compliant with statin, no side effects.  Hypoth: Takes T4 on empty stomach w/out any other meds.  Diet: not good diet since pandemic.  Diet program through her insurer----behav mod techniques.  Starting to do this, on week 5 now. Starting some exercise.  ROS: no fevers, no CP, no SOB, no wheezing, no cough, no dizziness, no HAs, no rashes, no melena/hematochezia.  No polyuria or polydipsia.  No myalgias or arthralgias.  No focal weakness, paresthesias, or tremors.  No acute vision or hearing abnormalities. No n/v/d or abd pain.  No palpitations.     Past Medical History:  Diagnosis Date  . Carpal tunnel syndrome   . Cervical spondylosis   . Depression   . GERD (gastroesophageal reflux disease)   . History of MRSA infection    R hand soft tissue  . History of MRSA infection   . Hyperlipidemia   . Hypertension   . IFG (impaired fasting glucose)    2019/20->A1c 5.5%  . Migraine syndrome    occasional  . Obesity, Class II, BMI 35-39.9   . OSA on CPAP 11/2013   severe; needs compliance monitoring->referred to neurologist ([facility])  . Osteoarthritis    Knees, ankles, fingers, wrists, sometimes hips.  Occ prn NSAID  . 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 Vitamin D deficiency 04/2019   22 ng/ml Sept 29, 2020.    Past Surgical History:  Procedure Laterality Date  . APPENDECTOMY  6 yrs ago   Dr. Lovell Sheehan  . COLONOSCOPY  2012; 02/20/20   approx 2012, normal. 01/2020 polyp x 1->per Dr. Loreta Ave  recall 5-10 yrs  . CYSTOSCOPY     for pain in side; normal  . FOOT SURGERY Right 2017   dorsal tarsal exostectomy, endoscopic plantar fasciotomy, bunionectomy with screw and removal of toenail 2nd toe R foot.  Marland Kitchen GANGLION CYST EXCISION    . polysomnogram  11/2013   severe OSA  . THYROIDECTOMY  12/10/2011   Bx benign.  Enlarging goiter.  Procedure: THYROIDECTOMY;  Surgeon: Dalia Heading, MD;  Location: [facility];  Service: General;  Laterality: N/A;  Total Thyroidectomy  . TRANSTHORACIC ECHOCARDIOGRAM  05/17/2015   EF 65-70%, mod LVH, normal wall motion, grd I DD, mild MR.    Outpatient Medications Prior to Visit  Medication Sig Dispense Refill  . eletriptan (RELPAX) 40 MG tablet One tablet by mouth at onset of headache. May repeat in 2 hours if headache persists or recurs. may repeat in 2 hours if necessary for migraines    . famotidine (PEPCID) 20 MG tablet     . ibuprofen (ADVIL,MOTRIN) 200 MG tablet Take 400 mg by mouth as needed.    Marland Kitchen levothyroxine (SYNTHROID) 112 MCG tablet TAKE (1) TABLET BY MOUTH EACH MORNING ON AN EMPTY STOMACH. 90 tablet 0  . losartan-hydrochlorothiazide (HYZAAR) 100-12.5 MG per tablet Take 1 tablet by mouth every morning.    . rosuvastatin (CRESTOR) 10  MG tablet Take 10 mg by mouth daily.     . Cholecalciferol (VITAMIN D) 50 MCG (2000 UT) CAPS Take by mouth daily.  (Patient not taking: Reported on 04/06/2020)    . potassium chloride SA (KLOR-CON) 20 MEQ tablet TAKE ONE TABLET BY MOUTH ONCE DAILY. (Patient not taking: Reported on 04/06/2020) 30 tablet 0   No facility-administered medications prior to visit.    Allergies  Allergen Reactions  . Bactrim [Sulfamethoxazole-Trimethoprim] Nausea And Vomiting  . Erythromycin Hives    REACTION: rash on trunk of body  . Iodinated Diagnostic Agents Swelling and Other (See Comments)    Reaction: itching,swelling around the eyes    ROS As per HPI  PE: Blood pressure 110/80, pulse 80, temperature 98.2 F (36.8 C),  temperature source Oral, resp. rate 16, height 5\' 7"  (1.702 m), weight 256 lb 6.4 oz (116.3 kg), SpO2 98 %. Body mass index is 40.16 kg/m.  Gen: Alert, well appearing.  Patient is oriented to person, place, time, and situation. AFFECT: pleasant, lucid thought and speech. CV: RRR, no m/r/g.   LUNGS: CTA bilat, nonlabored resps, good aeration in all lung fields. EXT: no clubbing or cyanosis.  no edema.    LABS:  Lab Results  Component Value Date   TSH 2.50 08/11/2019   Lab Results  Component Value Date   WBC 6.2 08/11/2019   HGB 14.1 08/11/2019   HCT 41.1 08/11/2019   MCV 88.7 08/11/2019   PLT 172.0 08/11/2019   Lab Results  Component Value Date   CREATININE 0.84 08/11/2019   BUN 12 08/11/2019   NA 140 08/11/2019   K 3.9 08/11/2019   CL 104 08/11/2019   CO2 30 08/11/2019   Lab Results  Component Value Date   ALT 23 08/11/2019   AST 22 08/11/2019   ALKPHOS 60 08/11/2019   BILITOT 1.0 08/11/2019   Lab Results  Component Value Date   CHOL 135 08/11/2019   Lab Results  Component Value Date   HDL 54.10 08/11/2019   Lab Results  Component Value Date   LDLCALC 61 08/11/2019   Lab Results  Component Value Date   TRIG 102.0 08/11/2019   Lab Results  Component Value Date   CHOLHDL 3 08/11/2019   Lab Results  Component Value Date   HGBA1C 5.5 08/11/2019    IMPRESSION AND PLAN:  1) HTN: The current medical regimen is effective;  continue present plan and medications. Lytes/cr today.  2) HLD: tolerating statin. Working on TLC/wt loss. FLP today.  3) Hypothyroidism: doing well, taking med correctly. TSH today.  4) IFG: working on TLC/wt loss the last 5 wks, She feels good about the track she is on. Fasting glucose today. Last A1c 07/2019 5.5%.  5) Hist of vit D def: pt not taking the 2000 U tab last few months. Recheck vit d level today. Even if normal I encouraged her to take 800-100 U per day.  An After Visit Summary was printed and given to the  patient.  FOLLOW UP: Return in about 6 months (around 10/07/2020) for annual CPE (fasting).  Signed:  10/09/2020, MD           04/06/2020

## 2020-04-25 ENCOUNTER — Other Ambulatory Visit: Payer: Self-pay | Admitting: Family Medicine

## 2020-05-25 ENCOUNTER — Other Ambulatory Visit: Payer: Self-pay | Admitting: Family Medicine

## 2020-05-25 NOTE — Telephone Encounter (Signed)
Requested medication not originally prescribed by you. Last visit was 04/06/20, next appt scheduled for 10/08/20. Please advise, thanks. Medication pending

## 2020-07-13 ENCOUNTER — Encounter: Payer: Self-pay | Admitting: Family Medicine

## 2020-07-13 ENCOUNTER — Other Ambulatory Visit: Payer: Self-pay

## 2020-07-13 ENCOUNTER — Ambulatory Visit (INDEPENDENT_AMBULATORY_CARE_PROVIDER_SITE_OTHER): Payer: PRIVATE HEALTH INSURANCE | Admitting: Family Medicine

## 2020-07-13 VITALS — BP 120/77 | HR 84 | Temp 98.1°F | Resp 16 | Ht 67.0 in | Wt 256.0 lb

## 2020-07-13 DIAGNOSIS — G4733 Obstructive sleep apnea (adult) (pediatric): Secondary | ICD-10-CM | POA: Diagnosis not present

## 2020-07-13 DIAGNOSIS — Z9889 Other specified postprocedural states: Secondary | ICD-10-CM

## 2020-07-13 DIAGNOSIS — R140 Abdominal distension (gaseous): Secondary | ICD-10-CM

## 2020-07-13 DIAGNOSIS — R1084 Generalized abdominal pain: Secondary | ICD-10-CM | POA: Diagnosis not present

## 2020-07-13 DIAGNOSIS — Z9989 Dependence on other enabling machines and devices: Secondary | ICD-10-CM

## 2020-07-13 LAB — CBC WITH DIFFERENTIAL/PLATELET
Basophils Absolute: 0 10*3/uL (ref 0.0–0.1)
Basophils Relative: 0.4 % (ref 0.0–3.0)
Eosinophils Absolute: 0.3 10*3/uL (ref 0.0–0.7)
Eosinophils Relative: 3.4 % (ref 0.0–5.0)
HCT: 42.5 % (ref 36.0–46.0)
Hemoglobin: 14.7 g/dL (ref 12.0–15.0)
Lymphocytes Relative: 24 % (ref 12.0–46.0)
Lymphs Abs: 1.8 10*3/uL (ref 0.7–4.0)
MCHC: 34.5 g/dL (ref 30.0–36.0)
MCV: 87.2 fl (ref 78.0–100.0)
Monocytes Absolute: 0.5 10*3/uL (ref 0.1–1.0)
Monocytes Relative: 7.3 % (ref 3.0–12.0)
Neutro Abs: 4.8 10*3/uL (ref 1.4–7.7)
Neutrophils Relative %: 64.9 % (ref 43.0–77.0)
Platelets: 195 10*3/uL (ref 150.0–400.0)
RBC: 4.88 Mil/uL (ref 3.87–5.11)
RDW: 13.2 % (ref 11.5–15.5)
WBC: 7.4 10*3/uL (ref 4.0–10.5)

## 2020-07-13 LAB — COMPREHENSIVE METABOLIC PANEL
ALT: 18 U/L (ref 0–35)
AST: 18 U/L (ref 0–37)
Albumin: 4.6 g/dL (ref 3.5–5.2)
Alkaline Phosphatase: 67 U/L (ref 39–117)
BUN: 14 mg/dL (ref 6–23)
CO2: 29 mEq/L (ref 19–32)
Calcium: 9.8 mg/dL (ref 8.4–10.5)
Chloride: 102 mEq/L (ref 96–112)
Creatinine, Ser: 0.82 mg/dL (ref 0.40–1.20)
GFR: 76.58 mL/min (ref >60.00)
Glucose, Bld: 101 mg/dL — ABNORMAL HIGH (ref 70–99)
Potassium: 3.8 mEq/L (ref 3.5–5.1)
Sodium: 140 mEq/L (ref 135–145)
Total Bilirubin: 1 mg/dL (ref 0.2–1.2)
Total Protein: 6.6 g/dL (ref 6.0–8.3)

## 2020-07-13 NOTE — Progress Notes (Signed)
OFFICE VISIT  07/13/2020  CC:  Chief Complaint  Patient presents with   Abdominal Pain    x7-8 weeks, searing pain occurring 3-4 times within time frame that has worsened since Sunday    HPI:    Patient is a 64 y.o. Caucasian female with HTN, HLD, and hypothyroidism who presents for abdominal discomfort.  Over a 7-8 wk period she has had about 6 episode in which she rolled over in bed and felt searing pain in R mid/lower abd for a few seconds and then resolved. Than about a week ago felt onset in morning of periumbilical and mid epigastric pain and some bloating feeling, states she actually sees a focal supraumbilical region bulging in abdomen. Seems to be felt when leans against sink for example but soreness is felt towards the end of the day--Worse after standing up all day and when upright.  Lots of burping and passing gas more this week, rare feeling of GER. Rarely has to take otc H2 blocker, no PPI. Lying down on her back relieves it some.  Eating does not change it. No nausea/vomiting.  No preceding strain although she recalls having lifted a heavy container but felt no pain at the time.  The next day is when pain started.  No change in bowel habits.  Appetite normal/unchanged.  No acute life stressors. No NSAIDs.  ROS: no fevers, no CP, no SOB, no wheezing, no cough, no dizziness, no HAs, no rashes, no melena/hematochezia.  No polyuria or polydipsia.  No myalgias or arthralgias.  No focal weakness, paresthesias, or tremors.  No acute vision or hearing abnormalities.   No palpitations.    Past Medical History:  Diagnosis Date   Carpal tunnel syndrome    Cervical spondylosis    Depression    GERD (gastroesophageal reflux disease)    History of MRSA infection    R hand soft tissue   Hyperlipidemia    Hypertension    IFG (impaired fasting glucose)    2019/20->A1c 5.5%   Migraine syndrome    occasional   Obesity, Class II, BMI 35-39.9    OSA on CPAP 11/2013    severe; needs compliance monitoring->referred to neurologist ([facility])   Osteoarthritis    Knees, ankles, fingers, wrists, sometimes hips.  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    Past Surgical History:  Procedure Laterality Date   APPENDECTOMY  6 yrs ago   Dr. Lovell Sheehan   COLONOSCOPY  2012; 02/20/20   approx 2012, normal. 01/2020 polyp x 1->per Dr. Loreta Ave recall 5-10 yrs   CYSTOSCOPY     for pain in side; normal   FOOT SURGERY Right 2017   dorsal tarsal exostectomy, endoscopic plantar fasciotomy, bunionectomy with screw and removal of toenail 2nd toe R foot.   GANGLION CYST EXCISION     polysomnogram  11/2013   severe OSA   THYROIDECTOMY  12/10/2011   Bx benign.  Enlarging goiter.  Procedure: THYROIDECTOMY;  Surgeon: Dalia Heading, MD;  Location: [facility];  Service: General;  Laterality: N/A;  Total Thyroidectomy   TRANSTHORACIC ECHOCARDIOGRAM  05/17/2015   EF 65-70%, mod LVH, normal wall motion, grd I DD, mild MR.    Outpatient Medications Prior to Visit  Medication Sig Dispense Refill   Cholecalciferol (VITAMIN D) 50 MCG (2000 UT) CAPS Take by mouth daily.      eletriptan (RELPAX) 40 MG tablet One tablet by mouth at  onset of headache. May repeat in 2 hours if headache persists or recurs. may repeat in 2 hours if necessary for migraines     famotidine (PEPCID) 20 MG tablet      levothyroxine (SYNTHROID) 112 MCG tablet TAKE (1) TABLET BY MOUTH EACH MORNING ON AN EMPTY STOMACH. 90 tablet 0   losartan-hydrochlorothiazide (HYZAAR) 100-12.5 MG tablet TAKE (1) TABLET BY MOUTH ONCE DAILY. 90 tablet 3   rosuvastatin (CRESTOR) 10 MG tablet TAKE ONE TABLET BY MOUTH ONCE DAILY. 30 tablet 5   ibuprofen (ADVIL,MOTRIN) 200 MG tablet Take 400 mg by mouth as needed. (Patient not taking: Reported on 07/13/2020)     potassium chloride SA (KLOR-CON) 20 MEQ tablet TAKE ONE TABLET BY MOUTH ONCE DAILY. (Patient not  taking: Reported on 04/06/2020) 30 tablet 0   No facility-administered medications prior to visit.    Allergies  Allergen Reactions   Bactrim [Sulfamethoxazole-Trimethoprim] Nausea And Vomiting   Erythromycin Hives    REACTION: rash on trunk of body   Iodinated Diagnostic Agents Swelling and Other (See Comments)    Reaction: itching,swelling around the eyes    ROS As per HPI  PE: Vitals with BMI 07/13/2020 04/06/2020 08/05/2019  Height 5\' 7"  5\' 7"  5\' 7"   Weight 256 lbs 256 lbs 6 oz 257 lbs 3 oz  BMI 40.09 40.15 40.27  Systolic 120 110  Diastolic 77 80 82  Pulse 84 80 95     Exam chaperoned by , CMA. Gen: Alert, well appearing.  Patient is oriented to person, place, time, and situation. AFFECT: pleasant, lucid thought and speech. : no injection, icteris, swelling, or exudate.  EOMI, PERRLA. Mouth: lips without lesion/swelling.  Oral mucosa pink and moist. Oropharynx without erythema, exudate, or swelling.  CV: RRR, no m/r/g.   LUNGS: CTA bilat, nonlabored resps, good aeration in all lung fields. ABD: rotund but no abd distention (generalized nor focal). Diffuse TTP, most notable in supra-umbilical region in midline.  No guarding or rebound.  No HSM, mass, or bruit. No rash or bruising.  Normal-appearing 3 cm laparoscopy scar just above umbilicus w/out palpable abd wall defect.  LABS:    Chemistry      Component Value Date/Time   NA 142 04/06/2020 1202   NA 140 02/16/2018 0000   K 3.5 04/06/2020 1202   CL 106 04/06/2020 1202   CO2 26 04/06/2020 1202   BUN 14 04/06/2020 1202   BUN 13 05/25/2019 0000   CREATININE 0.87 04/06/2020 1202   GLU 121 05/25/2019 0000      Component Value Date/Time   CALCIUM 9.9 04/06/2020 1202   ALKPHOS 60 08/11/2019 0929   AST 22 08/11/2019 0929   ALT 23 08/11/2019 0929   BILITOT 1.0 08/11/2019 0929     Lab Results  Component Value Date   WBC 6.2 08/11/2019   HGB 14.1 08/11/2019   HCT 41.1 08/11/2019    MCV 88.7 08/11/2019   PLT 172.0 08/11/2019   Lab Results  Component Value Date   TSH 0.83 04/06/2020    IMPRESSION AND PLAN:  1) Generalized abdominal pain. Features c/w functional GI pain but some question of midline epigastric distention worrisome for ventral hernia.  Also question whether or not this is all abd wall pain. CBC w/diff and cmet now. Check plain abd films x  2view and check CT abd pelv w/contrast to further eval for possible ventral hernia.  2) She has OSA and uses CPAP. Needs to get established with sleep MD  to get monitored for compliance/efficacy/need for new machine, supplies, etc. Referral ordered today.  An After Visit Summary was printed and given to the patient.  FOLLOW UP: No follow-ups on file.  Signed:  Santiago Bumpers, MD           07/13/2020

## 2020-07-16 ENCOUNTER — Ambulatory Visit: Payer: PRIVATE HEALTH INSURANCE | Admitting: Family Medicine

## 2020-07-18 ENCOUNTER — Other Ambulatory Visit: Payer: Self-pay | Admitting: Family Medicine

## 2020-07-25 ENCOUNTER — Ambulatory Visit (INDEPENDENT_AMBULATORY_CARE_PROVIDER_SITE_OTHER): Payer: PRIVATE HEALTH INSURANCE | Admitting: Adult Health

## 2020-07-25 ENCOUNTER — Other Ambulatory Visit: Payer: Self-pay

## 2020-07-25 ENCOUNTER — Other Ambulatory Visit (HOSPITAL_COMMUNITY)
Admission: RE | Admit: 2020-07-25 | Discharge: 2020-07-25 | Disposition: A | Discharge status: Home / Self Care | Payer: PRIVATE HEALTH INSURANCE | Source: Ambulatory Visit | Attending: Adult Health | Admitting: Adult Health

## 2020-07-25 ENCOUNTER — Encounter: Payer: Self-pay | Admitting: Adult Health

## 2020-07-25 VITALS — BP 141/89 | HR 84 | Ht 66.0 in | Wt 257.0 lb

## 2020-07-25 DIAGNOSIS — Z01419 Encounter for gynecological examination (general) (routine) without abnormal findings: Secondary | ICD-10-CM | POA: Insufficient documentation

## 2020-07-25 DIAGNOSIS — Z1211 Encounter for screening for malignant neoplasm of colon: Secondary | ICD-10-CM | POA: Insufficient documentation

## 2020-07-25 LAB — HEMOCCULT GUIAC POC 1CARD (OFFICE): Fecal Occult Blood, POC: NEGATIVE

## 2020-07-25 NOTE — Progress Notes (Signed)
Patient ID: Theresa Young, female   DOB: [birth year], 64 y.o.   MRN: [id number] History of Present Illness: Theresa Young is a 64 year old white female, married, PM in for a well woman gyn exam and pap. PCP is Dr Marvel Plan.    Current Medications, Allergies, Past Medical History, Past Surgical History, Family History and Social History were reviewed in [name] record.     Review of Systems: Patient denies any headaches, hearing loss, fatigue, blurred vision, shortness of breath, chest pain, problems with bowel movements, urination, or intercourse. No joint pain or mood swings. Had abdominal pap about a week ago and it has resolved, she saw her PCP and is awaiting CT   Physical Exam:BP (!) 141/89 (BP Location: Left Arm, Patient Position: Sitting, Cuff Size: Large)   Pulse 84   Ht 5\' 6"  (1.676 m)   Wt 257 lb (116.6 kg)   BMI 41.48 kg/m  General:  Well developed, well nourished, no acute distress Skin:  Warm and dry Neck:  Midline trachea, normal thyroid, good ROM, no lymphadenopathy,no carotid bruits heard  Lungs; Clear to auscultation bilaterally Breast:  No dominant palpable mass, retraction, or nipple discharge Cardiovascular: Regular rate and rhythm Abdomen:  Soft, non tender, no hepatosplenomegaly Pelvic:  External genitalia is normal in appearance, no lesions.  The vagina is pale with loss of moisture and rugae. Urethra has no lesions or masses. The cervix is smooth, pap with high risk HPV genotyping performed.  Uterus is felt to be normal size, shape, and contour.  No adnexal masses or tenderness noted.Bladder is non tender, no masses felt. Rectal: Good sphincter tone, no polyps, or hemorrhoids felt.  Hemoccult negative. Extremities/musculoskeletal:  No swelling or varicosities noted, no clubbing or cyanosis Psych:  No mood changes, alert and cooperative,seems happy AA is 1  Fall risk is low PHQ 9 score is 1  Upstream - 07/25/20 0917      Pregnancy Intention  Screening   Does the patient want to become pregnant in the next year? N/A    Does the patient's partner want to become pregnant in the next year? N/A    Would the patient like to discuss contraceptive options today? N/A      Contraception Wrap Up   Current Method No Method - Other Reason   postmenopausal   End Method No Method - Other Reason   postmenopausal   Contraception Counseling Provided No         Examination chaperoned by 14/01/21 LPN  Impression and Plan: 1. Encounter for gynecological examination with Papanicolaou smear of cervix Pap sent Physical in 1 year Pap in 3 years Mammogram yearly Colonoscopy per GI Labs with PCP  2. Encounter for screening fecal occult blood testing

## 2020-07-30 LAB — CYTOLOGY - PAP
Comment: NEGATIVE
Diagnosis: NEGATIVE
High risk HPV: NEGATIVE

## 2020-07-30 LAB — HM PAP SMEAR: HM Pap smear: NEGATIVE

## 2020-08-20 ENCOUNTER — Telehealth: Payer: Self-pay

## 2020-08-20 DIAGNOSIS — I10 Essential (primary) hypertension: Secondary | ICD-10-CM

## 2020-08-20 NOTE — Telephone Encounter (Signed)
Pt scheduled for 08/28/19

## 2020-08-20 NOTE — Telephone Encounter (Signed)
Theresa Young from [facility], calling in regards pt CT Scan being approved and sched for 1/7 @10  Pt is needing labs (creatine and BUN) prior to appt.

## 2020-08-20 NOTE — Telephone Encounter (Signed)
OK  needs non-fasting lab appt for BMET, dx is essential hypertension-thx

## 2020-08-20 NOTE — Telephone Encounter (Signed)
Pt will need assistance with scheduling nurse visit (non fasting).

## 2020-08-20 NOTE — Addendum Note (Signed)
Addended by: Paschal Dopp on: 08/20/2020 02:20 PM   Modules accepted: Orders

## 2020-08-27 ENCOUNTER — Ambulatory Visit (INDEPENDENT_AMBULATORY_CARE_PROVIDER_SITE_OTHER): Payer: PRIVATE HEALTH INSURANCE

## 2020-08-27 ENCOUNTER — Encounter: Payer: Self-pay | Admitting: Neurology

## 2020-08-27 ENCOUNTER — Ambulatory Visit (INDEPENDENT_AMBULATORY_CARE_PROVIDER_SITE_OTHER): Payer: PRIVATE HEALTH INSURANCE | Admitting: Neurology

## 2020-08-27 ENCOUNTER — Other Ambulatory Visit: Payer: Self-pay

## 2020-08-27 ENCOUNTER — Telehealth: Payer: Self-pay | Admitting: Family Medicine

## 2020-08-27 VITALS — BP 147/86 | HR 66 | Ht 66.0 in | Wt 258.0 lb

## 2020-08-27 DIAGNOSIS — R5383 Other fatigue: Secondary | ICD-10-CM | POA: Diagnosis not present

## 2020-08-27 DIAGNOSIS — G4733 Obstructive sleep apnea (adult) (pediatric): Secondary | ICD-10-CM | POA: Diagnosis not present

## 2020-08-27 DIAGNOSIS — I10 Essential (primary) hypertension: Secondary | ICD-10-CM

## 2020-08-27 DIAGNOSIS — Z9989 Dependence on other enabling machines and devices: Secondary | ICD-10-CM

## 2020-08-27 DIAGNOSIS — R351 Nocturia: Secondary | ICD-10-CM

## 2020-08-27 LAB — BASIC METABOLIC PANEL
BUN: 11 mg/dL (ref 6–23)
CO2: 30 mEq/L (ref 19–32)
Calcium: 10 mg/dL (ref 8.4–10.5)
Chloride: 103 mEq/L (ref 96–112)
Creatinine, Ser: 0.95 mg/dL (ref 0.40–1.20)
GFR: 64.13 mL/min (ref >60.00)
Glucose, Bld: 105 mg/dL — ABNORMAL HIGH (ref 70–99)
Potassium: 4 mEq/L (ref 3.5–5.1)
Sodium: 140 mEq/L (ref 135–145)

## 2020-08-27 NOTE — Progress Notes (Signed)
Subjective:    Patient ID: Theresa Young is a 65 y.o. female.  HPI     Huston Foley, MD, PhD [facility] [address] [address] [place], [place] [address]  Dear Dr. Milinda Cave,   I saw your patient, Theresa Young, upon your kind request in my sleep clinic today for initial consultation of her sleep disorder, in particular, evaluation of her prior diagnosis of obstructive sleep apnea.  The patient is unaccompanied today.  As you know, [name] is a 65 year old right-handed woman with an underlying medical history of hypertension, hyperlipidemia, hypothyroidism, vitamin D deficiency, osteoarthritis, migraines (followed by Dr. Neale Burly), carpal tunnel syndrome, reflux disease, depression, cervical spondylosis, and severe obesity with a BMI of over 40, who was previously diagnosed with obstructive sleep apnea and placed on positive airway pressure treatment.  I reviewed your office note from 07/13/2020.  I was able to review her sleep study from 12/19/2013.  Study was interpreted by Dr. Gerilyn Pilgrim time.  She probably had a split-night sleep study.  Baseline AHI was reportedly 51/h, O2 nadir 79% with a baseline oxygen saturation of 97%.  She was noted to have frequent PVCs.  She noted was noted to respond well to CPAP at 10 cm. Her Epworth sleepiness score is 12 out of 24, fatigue severity score 25 out of 63.  She lives with her husband and they are raising a great nephew, age 84.  She takes him to school.  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  Bedtime is generally between 930 and 10 and rise time around 630, latest by 6:45 AM.  She denies recurrent morning headaches but does have nocturia about once per average night.  She suspects that her father had sleep apnea, he died in his 56s from heart disease.  She uses a Primary school teacher medium size full facemask, sometimes she notices the leak. I was able to review her CPAP  compliance data from her machine.  She has a S9 escape from [employer].  Pressure at 10 cm, leak and residual AHI information is unfortunately not available.  From 07/28/2020 through 08/26/2020 she has used her machine every night with percent use days greater than 4 hours at 100%, indicating superb compliance, average usage of 7 hours and 52 minutes.  He does have some difficulty maintaining sleep at times.  She does not take anything for sleep.  Some years ago she tried melatonin but had vivid dreams.  She follows with Dr. Neale Burly once a year.  She was able to wean off of imipramine for migraine prevention and uses eletriptan infrequently for acute migraines.  She has worked on weight loss.  She is working with a program through her insurance. She drinks caffeine in the form of soda or tea, about 2 servings per day on average.  She drinks alcohol occasionally.  She is a non-smoker.  They have no indoor pets, they have outdoor cats.  Her Past Medical History Is Significant For: Past Medical History:  Diagnosis Date  . Carpal tunnel syndrome   . Cervical spondylosis   . Depression   . GERD (gastroesophageal reflux disease)   . History of MRSA infection    R hand soft tissue  . Hyperlipidemia   . Hypertension   . IFG (impaired fasting glucose)    2019/20->A1c 5.5%  . Migraine syndrome    occasional  . Obesity, Class II, BMI 35-39.9   .  OSA on CPAP 11/2013   severe; needs compliance monitoring->referred to neurologist ([facility])  . Osteoarthritis    Knees, ankles, fingers, wrists, sometimes hips.  Occ prn NSAID  . 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 Vitamin D deficiency 04/2019   22 ng/ml Sept 29, 2020.    Her Past Surgical History Is Significant For: Past Surgical History:  Procedure Laterality Date  . APPENDECTOMY  6 yrs ago   Dr. Lovell Sheehan  . COLONOSCOPY  2012; 02/20/20   approx 2012, normal. 01/2020 polyp x 1->per Dr. Loreta Ave recall 5-10 yrs  . CYSTOSCOPY     for pain in side; normal  .  FOOT SURGERY Right 2017   dorsal tarsal exostectomy, endoscopic plantar fasciotomy, bunionectomy with screw and removal of toenail 2nd toe R foot.  Marland Kitchen GANGLION CYST EXCISION    . polysomnogram  11/2013   severe OSA  . THYROIDECTOMY  12/10/2011   Bx benign.  Enlarging goiter.  Procedure: THYROIDECTOMY;  Surgeon: Dalia Heading, MD;  Location: [facility];  Service: General;  Laterality: N/A;  Total Thyroidectomy  . TRANSTHORACIC ECHOCARDIOGRAM  05/17/2015   EF 65-70%, mod LVH, normal wall motion, grd I DD, mild MR.    Her Family History Is Significant For: Family History  Problem Relation Age of Onset  . Hypertension Mother   . Cancer Mother        lung  . Migraines Mother   . Ulcers Mother   . Hypertension Father   . Migraines Father   . Heart disease Father   . Ulcers Father   . Early death Sister   . Diabetes Sister   . Diabetes Paternal Grandmother   . Kidney disease Paternal Grandfather        kidney failure  . Heart attack Paternal Grandfather   . Early death Sister   . Obesity Sister   . Hypertension Sister   . Migraines Sister   . Cancer Sister        ? cervical cancer  . Multiple sclerosis Sister   . Breast cancer Sister   . Pseudochol deficiency Neg Hx   . Malignant hyperthermia Neg Hx   . Hypotension Neg Hx   . Anesthesia problems Neg Hx     Her Social History Is Significant For: Social History   Socioeconomic History  . Marital status: Married    Spouse name: Not on file  . Number of children: Not on file  . Years of education: Not on file  . Highest education level: Not on file  Occupational History  . Not on file  Tobacco Use  . Smoking status: Never Smoker  . Smokeless tobacco: Never Used  Vaping Use  . Vaping Use: Never used  Substance and Sexual Activity  . Alcohol use: Yes    Comment: very rarely  . Drug use: No  . Sexual activity: Yes    Birth control/protection: Post-menopausal  Other Topics Concern  . Not on file  Social History  Narrative   Married, 3 step children.  She raised her nephew who has special needs.   Educ: masters deg   Occup: retired Psychologist, prison and probation services   Rare alcohol.   No tob.   Social Determinants of Health   Financial Resource Strain: Low Risk   . Difficulty of Paying Living Expenses: Not hard at all  Food Insecurity: No Food Insecurity  . Worried About Programme researcher, broadcasting/film/video in the Last Year: Never true  . Ran Out of Food in the Last  Year: Never true  Transportation Needs: No Transportation Needs  . Lack of Transportation (Medical): No  . Lack of Transportation (Non-Medical): No  Physical Activity: Insufficiently Active  . Days of Exercise per Week: 2 days  . Minutes of Exercise per Session: 20 min  Stress: No Stress Concern Present  . Feeling of Stress : Only a little  Social Connections: Socially Integrated  . Frequency of Communication with Friends and Family: More than three times a week  . Frequency of Social Gatherings with Friends and Family: Once a week  . Attends Religious Services: More than 4 times per year  . Active Member of Clubs or Organizations: Yes  . Attends Banker Meetings: More than 4 times per year  . Marital Status: Married    Her Allergies Are:  Allergies  Allergen Reactions  . Bactrim [Sulfamethoxazole-Trimethoprim] Nausea And Vomiting  . Erythromycin Hives    REACTION: rash on trunk of body  . Iodinated Diagnostic Agents Swelling and Other (See Comments)    Reaction: itching,swelling around the eyes  :   Her Current Medications Are:  Outpatient Encounter Medications as of 08/27/2020  Medication Sig  . Cholecalciferol (VITAMIN D) 50 MCG (2000 UT) CAPS Take by mouth daily.   Marland Kitchen eletriptan (RELPAX) 40 MG tablet Take 40 mg by mouth as needed. may repeat in 2 hours if necessary for migraines  . famotidine (PEPCID) 20 MG tablet   . levothyroxine (SYNTHROID) 112 MCG tablet TAKE (1) TABLET BY MOUTH EACH MORNING ON AN EMPTY STOMACH.  Marland Kitchen  losartan-hydrochlorothiazide (HYZAAR) 100-12.5 MG tablet TAKE (1) TABLET BY MOUTH ONCE DAILY.  . rosuvastatin (CRESTOR) 10 MG tablet TAKE ONE TABLET BY MOUTH ONCE DAILY.   No facility-administered encounter medications on file as of 08/27/2020.  :  Review of Systems:  Out of a complete 14 point review of systems, all are reviewed and negative with the exception of these symptoms as listed below: Review of Systems  Neurological:       Pt presents today to discuss her cpap. Pt needs cpap supplies. Her last sleep study was about 6 years ago at [facility] and read by Dr. Gerilyn Pilgrim.   Epworth Sleepiness Scale 0= would never doze 1= slight chance of dozing 2= moderate chance of dozing 3= high chance of dozing  Sitting and reading: 1 Watching TV: 2 Sitting inactive in a public place (ex. Theater or meeting): 1 As a passenger in a car for an hour without a break: 3 Lying down to rest in the afternoon: 3 Sitting and talking to someone: 0 Sitting quietly after lunch (no alcohol): 2 In a car, while stopped in traffic: 0 Total: 12     Objective:  Neurological Exam  Physical Exam Physical Examination:   Vitals:   08/27/20 1049  BP: (!) 147/86  Pulse: 66   General Examination: The patient is a very pleasant 65 y.o. female in no acute distress. She appears well-developed and well-nourished and well groomed.   HEENT: Normocephalic, atraumatic, pupils are equal, round and reactive to light, extraocular tracking is good without limitation to gaze excursion or nystagmus noted. Hearing is grossly intact. Face is symmetric with normal facial animation. Speech is clear with no dysarthria noted. There is no hypophonia. There is no lip, neck/head, jaw or voice tremor. Neck is supple with full range of passive and active motion. There are no carotid bruits on auscultation. Oropharynx exam reveals: mild mouth dryness, adequate dental hygiene and moderate airway crowding, due to small  airway entry and somewhat  prominent uvula.  Tonsils on the smaller side, Mallampati class III.  Neck circumference of 17-7/8 inches.  Tongue protrudes centrally and palate elevates symmetrically.  Chest: Clear to auscultation without wheezing, rhonchi or crackles noted.  Heart: S1+S2+0, regular and normal without murmurs, rubs or gallops noted.   Abdomen: Soft, non-tender and non-distended with normal bowel sounds appreciated on auscultation.  Extremities: There is no pitting edema in the distal lower extremities bilaterally.   Skin: Warm and dry without trophic changes noted.   Musculoskeletal: exam reveals no obvious joint deformities, tenderness or joint swelling or erythema.   Neurologically:  Mental status: The patient is awake, alert and oriented in all 4 spheres. Her immediate and remote memory, attention, language skills and fund of knowledge are appropriate. There is no evidence of aphasia, agnosia, apraxia or anomia. Speech is clear with normal prosody and enunciation. Thought process is linear. Mood is normal and affect is normal.  Cranial nerves II - XII are as described above under HEENT exam.  Motor exam: Normal bulk, strength and tone is noted. There is no tremor, fine motor skills and coordination: grossly intact.  Cerebellar testing: No dysmetria or intention tremor. There is no truncal or gait ataxia.  Sensory exam: intact to light touch in the upper and lower extremities.  Gait, station and balance: She stands easily. No veering to one side is noted. No leaning to one side is noted. Posture is age-appropriate and stance is narrow based. Gait shows normal stride length and normal pace. No problems turning are noted.   Assessment and plan:  In summary, SAMEKA BAGENT is a very pleasant 65 y.o.-year old female with an underlying medical history of hypertension, hyperlipidemia, hypothyroidism, vitamin D deficiency, osteoarthritis, migraines (followed by Dr. Domingo Cocking), carpal tunnel syndrome, reflux  disease, depression, cervical spondylosis, and severe obesity with a BMI of over 33, who presents for evaluation of her obstructive sleep apnea (OSA).  She was diagnosed with severe obstructive sleep apnea in April 2015.  She has been on CPAP therapy and has benefited from treatment.  She is compliant with treatment.  Unfortunately, leak and residual AHI information is not available on her current machine but she certainly is fully compliant with treatment and commended for it.  She should be eligible for new equipment.  I will write for new supplies through her current DME company and a mask refit appointment as she has noticed a leak from her current mask.  Her DME is [employer].  She is agreeable to pursuing a home sleep test for reevaluation and confirming her diagnosis of OSA and to qualify for new equipment.  We will arrange for her home sleep test at her convenience.  She is reminded not to use her CPAP at night.  She generally does not skip any nights and has regretted not using it when she went on an overnight trip for 1 night without treatment.  She is encouraged to continue with her current machine with full compliance and once she is able to start with a new machine, she is advised regarding the importance of compliance with her new machine and a follow-up needed within a certain timeframe, particularly for insurance purposes.  She is advised that once she is stable on her new machine, we can see her hopefully yearly.  She is encouraged to continue to work on weight loss. I answered all her questions today and she was in agreement with the plan. Thank you very  much for allowing me to participate in the care of this nice patient. If I can be of any further assistance to you please do not hesitate to call me at [phone].  Sincerely,   Star Age, MD, PhD

## 2020-08-27 NOTE — Progress Notes (Signed)
[facility] for [facility] to  [facility]. Confirmation received that the order transmitted was successful. 

## 2020-08-27 NOTE — Patient Instructions (Signed)
Thank you for choosing [facility] for your sleep related care! It was nice to meet you today! I appreciate that you entrust me with your sleep related healthcare concerns. I hope, I was able to address at least some of your concerns today, and that I can help you feel reassured and also get better.    Here is what we discussed today and what we came up with as our plan for you:    Based on your symptoms and your exam I believe you are still at risk for obstructive sleep apnea and would benefit from reevaluation as it has been many years and you need new supplies and updated machine. Therefore, I think we should proceed with a sleep study to determine how severe your sleep apnea is. If you have more than mild OSA, I want you to consider ongoing treatment with CPAP or autoPAP. Please remember, the risks and ramifications of moderate to severe obstructive sleep apnea or OSA are: Cardiovascular disease, including congestive heart failure, stroke, difficult to control hypertension, arrhythmias, and even type 2 diabetes has been linked to untreated OSA. Sleep apnea causes disruption of sleep and sleep deprivation in most cases, which, in turn, can cause recurrent headaches, problems with memory, mood, concentration, focus, and vigilance. Most people with untreated sleep apnea report excessive daytime sleepiness, which can affect their ability to drive. Please do not drive if you feel sleepy.   As discussed, for ease of scheduling in for your convenience, we will proceed with a home sleep test.  In the meantime, I will write for new supplies and a new mask through [facility].  Please get in touch with them at your next convenience to set up an appointment for mask refit.   I will likely see you back after your sleep study to go over the test results and where to go from there. We will call you after your sleep study to advise about the results (most likely, you will hear from  Glen Allan, my nurse) and to set up an appointment at the time, as necessary.    Our sleep lab administrative assistant will call you to schedule your sleep study. If you don't hear back from her by about 2 weeks from now, please feel free to call her at [phone]. You can leave a message with your phone number and concerns, if you get the voicemail box. She will call back as soon as possible.

## 2020-08-27 NOTE — Telephone Encounter (Signed)
Patient returned Britt's call and I relayed the following message from Dr. Milinda Cave "Labs normal. Pt is ok to get the CT scan she has scheduled for Jan 7th."

## 2020-08-27 NOTE — Telephone Encounter (Signed)
Will copy this into result note and close out.

## 2020-08-30 ENCOUNTER — Telehealth: Payer: Self-pay

## 2020-08-30 NOTE — Telephone Encounter (Signed)
Calling to schedule sleep study. 

## 2020-08-31 ENCOUNTER — Telehealth: Payer: Self-pay

## 2020-08-31 ENCOUNTER — Other Ambulatory Visit: Payer: Self-pay

## 2020-08-31 ENCOUNTER — Ambulatory Visit (HOSPITAL_BASED_OUTPATIENT_CLINIC_OR_DEPARTMENT_OTHER)
Admission: RE | Admit: 2020-08-31 | Discharge: 2020-08-31 | Disposition: A | Discharge status: Home / Self Care | Payer: PRIVATE HEALTH INSURANCE | Source: Ambulatory Visit | Attending: Family Medicine | Admitting: Family Medicine

## 2020-08-31 DIAGNOSIS — R140 Abdominal distension (gaseous): Secondary | ICD-10-CM

## 2020-08-31 DIAGNOSIS — Z9889 Other specified postprocedural states: Secondary | ICD-10-CM

## 2020-08-31 DIAGNOSIS — R1084 Generalized abdominal pain: Secondary | ICD-10-CM

## 2020-08-31 NOTE — Telephone Encounter (Signed)
Patient had an appt to get CT today.  Patient has had contrast allergy before but she was given meds prior to have CT done.  Santina Evans from [employer] said this is what patient will need She is scheduled to go back tomorrow morning 1/8 - if this cannot be called into day - please reschedule CT  Prednizone 50mg   Given 13 hours. 7 hours, and 1 hour prior to CT Benadryl 50 mg given 1 hour prior to CT  [facility] [place]  Thank you!

## 2020-08-31 NOTE — Telephone Encounter (Signed)
Please advise 

## 2020-09-01 ENCOUNTER — Encounter (HOSPITAL_BASED_OUTPATIENT_CLINIC_OR_DEPARTMENT_OTHER): Payer: Self-pay

## 2020-09-01 ENCOUNTER — Ambulatory Visit (HOSPITAL_BASED_OUTPATIENT_CLINIC_OR_DEPARTMENT_OTHER): Admission: RE | Admit: 2020-09-01 | Payer: PRIVATE HEALTH INSURANCE | Source: Ambulatory Visit

## 2020-09-02 MED ORDER — PREDNISONE 50 MG PO TABS: ORAL_TABLET | ORAL | 0 refills | Status: DC

## 2020-09-02 MED ORDER — DIPHENHYDRAMINE HCL 50 MG PO TABS: ORAL_TABLET | ORAL | 0 refills | Status: DC

## 2020-09-02 NOTE — Telephone Encounter (Signed)
Prednisone and benadryl eRx'd but she'll have to reschedule her CT. Sorry!

## 2020-09-03 ENCOUNTER — Telehealth: Payer: Self-pay

## 2020-09-03 NOTE — Telephone Encounter (Signed)
Patient contacted. She is aware prednisone and benadryl at pharmacy.  Patient said she will reach out to [facility] to schedule CT.

## 2020-09-03 NOTE — Telephone Encounter (Signed)
LVM for pt to call me back to schedule sleep study  

## 2020-09-03 NOTE — Telephone Encounter (Signed)
Could please inform patient?

## 2020-09-10 ENCOUNTER — Ambulatory Visit (HOSPITAL_BASED_OUTPATIENT_CLINIC_OR_DEPARTMENT_OTHER): Admission: RE | Admit: 2020-09-10 | Payer: PRIVATE HEALTH INSURANCE | Source: Ambulatory Visit

## 2020-09-14 ENCOUNTER — Ambulatory Visit (HOSPITAL_BASED_OUTPATIENT_CLINIC_OR_DEPARTMENT_OTHER)
Admission: RE | Admit: 2020-09-14 | Discharge: 2020-09-14 | Disposition: A | Discharge status: Home / Self Care | Payer: PRIVATE HEALTH INSURANCE | Source: Ambulatory Visit | Attending: Family Medicine | Admitting: Family Medicine

## 2020-09-14 ENCOUNTER — Other Ambulatory Visit: Payer: Self-pay

## 2020-09-14 DIAGNOSIS — R1084 Generalized abdominal pain: Secondary | ICD-10-CM | POA: Diagnosis not present

## 2020-09-14 DIAGNOSIS — R140 Abdominal distension (gaseous): Secondary | ICD-10-CM | POA: Insufficient documentation

## 2020-09-14 DIAGNOSIS — Z9889 Other specified postprocedural states: Secondary | ICD-10-CM | POA: Insufficient documentation

## 2020-09-14 MED ORDER — IOHEXOL 300 MG/ML  SOLN
100.0000 mL | Freq: Once | INTRAMUSCULAR | Status: AC | PRN
  Administered 2020-09-14: 100 mL via INTRAVENOUS

## 2020-09-17 ENCOUNTER — Ambulatory Visit (INDEPENDENT_AMBULATORY_CARE_PROVIDER_SITE_OTHER): Payer: PRIVATE HEALTH INSURANCE | Admitting: Neurology

## 2020-09-17 DIAGNOSIS — R5383 Other fatigue: Secondary | ICD-10-CM

## 2020-09-17 DIAGNOSIS — Z9989 Dependence on other enabling machines and devices: Secondary | ICD-10-CM

## 2020-09-17 DIAGNOSIS — R351 Nocturia: Secondary | ICD-10-CM

## 2020-09-17 DIAGNOSIS — G4733 Obstructive sleep apnea (adult) (pediatric): Secondary | ICD-10-CM

## 2020-09-18 NOTE — Progress Notes (Signed)
° °

## 2020-09-19 NOTE — Addendum Note (Signed)
Addended by: Huston Foley on: 09/19/2020 05:22 PM   Modules accepted: Orders

## 2020-09-19 NOTE — Progress Notes (Signed)
Patient referred by Dr. Milinda Cave for re-eval of her OSA; she has been on PAP therapy with full compliance, seen by me on 08/27/20, patient had a HST on 09/17/20.    Please call and notify the patient that the recent home sleep test confirmed obstructive sleep apnea in the severe range. I recommend treatment for this in the form of autoPAP, which means, that we don't have to bring her in for a sleep study with CPAP, but will let her start using a so called autoPAP machine at home, through a DME company (of her choice, or as per insurance requirement). The DME representative will fit the patient with a mask of choice, educate her on how to use the machine, how to put the mask on, etc. I have placed an order in the chart. Please send the order to a local DME, talk to patient, send report to referring MD. Please also reinforce the need for compliance with treatment. We will need a FU in sleep clinic for 10 weeks post-PAP set up, please arrange that with me or one of our NPs. Thanks,   Huston Foley, MD, PhD [facility])

## 2020-09-19 NOTE — Procedures (Signed)
   [facility]  HOME SLEEP TEST (Watch PAT)  STUDY DATE: 09/17/20  DOB: 1956-08-05  MRN: [id number]  ORDERING CLINICIAN: Huston Foley, MD, PhD   REFERRING CLINICIAN: McGowen, Maryjean Morn, MD   CLINICAL INFORMATION/HISTORY: 65 year old woman with a history of hypertension, hyperlipidemia, hypothyroidism, vitamin D deficiency, osteoarthritis, migraines, carpal tunnel syndrome, reflux disease, depression, cervical spondylosis, and severe obesity with a BMI of over 40, who was previously diagnosed with obstructive sleep apnea and placed on positive airway pressure treatment. She presents for re-evaluation.  Epworth sleepiness score: 12/24.  BMI: 41.5 kg/m  Neck Circumference: 17-7/8  FINDINGS:   Total Record Time (hours, min): 8 H 21 min  Total Sleep Time (hours, min):  7 H 19 min   Percent REM (%):    17.69 %   Calculated pAHI (per hour): 72.3       REM pAHI: 62.9     NREM pAHI: 74.4 Supine AHI: 74.8   Oxygen Saturation (%) Mean: 94  Minimum oxygen saturation (%):        82   O2 Saturation Range (%): 82-99  O2Saturation (minutes) <=88%: 15.3 min   Pulse Mean (bpm):  60   Pulse Range (50-113)   IMPRESSION: OSA (obstructive sleep apnea), severe   RECOMMENDATION:  This home sleep test demonstrates severe obstructive sleep apnea with a total AHI of 72.3/hour and O2 nadir of 82%. Moderate to loud snoring was noted. Given the patient's medical history and sleep related complaints, treatment with positive airway pressure (in the form of CPAP) is recommended. This will require a full night CPAP titration study for proper treatment settings, O2 monitoring and mask fitting. Based on the severity of the sleep disordered breathing an attended titration study is indicated. However, patient's insurance has denied an attended sleep study; therefore, the patient will be advised to proceed with an autoPAP titration/trial at home for now. Please note that untreated obstructive  sleep apnea may carry additional perioperative morbidity. Patients with significant obstructive sleep apnea should receive perioperative PAP therapy and the surgeons and particularly the anesthesiologist should be informed of the diagnosis and the severity of the sleep disordered breathing. The patient should be cautioned not to drive, work at heights, or operate dangerous or heavy equipment when tired or sleepy. Review and reiteration of good sleep hygiene measures should be pursued with any patient. Other causes of the patient's symptoms, including circadian rhythm disturbances, an underlying mood disorder, medication effect and/or an underlying medical problem cannot be ruled out based on this test. Clinical correlation is recommended. The patient and her referring provider will be notified of the test results. The patient will be seen in follow up in sleep clinic at [facility].  I certify that I have reviewed the raw data recording prior to the issuance of this report in accordance with the standards of the American Academy of Sleep Medicine (AASM).  INTERPRETING PHYSICIAN:  Huston Foley, MD, PhD  Board Certified in Neurology and Sleep Medicine [facility] [address], [address] [place], [place] [address] [phone]

## 2020-09-20 ENCOUNTER — Telehealth: Payer: Self-pay

## 2020-09-20 NOTE — Telephone Encounter (Signed)
-----   Message from Huston Foley, MD sent at 09/19/2020  5:22 PM EST ----- Patient referred by Dr. Milinda Cave for re-eval of her OSA; she has been on PAP therapy with full compliance, seen by me on 08/27/20, patient had a HST on 09/17/20.    Please call and notify the patient that the recent home sleep test confirmed obstructive sleep apnea in the severe range. I recommend treatment for this in the form of autoPAP, which means, that we don't have to bring her in for a sleep study with CPAP, but will let her start using a so called autoPAP machine at home, through a DME company (of her choice, or as per insurance requirement). The DME representative will fit the patient with a mask of choice, educate her on how to use the machine, how to put the mask on, etc. I have placed an order in the chart. Please send the order to a local DME, talk to patient, send report to referring MD. Please also reinforce the need for compliance with treatment. We will need a FU in sleep clinic for 10 weeks post-PAP set up, please arrange that with me or one of our NPs. Thanks,   Huston Foley, MD, PhD [facility])

## 2020-09-20 NOTE — Telephone Encounter (Signed)
I called pt. No answer, left a message asking pt to call me back.   

## 2020-09-24 NOTE — Telephone Encounter (Signed)
I called pt. I advised pt that Dr. Frances Furbish reviewed their sleep study results and found that pt has severe osa. Dr. Frances Furbish recommends that pt start an autoPAP at home. I reviewed PAP compliance expectations with the pt. Pt is agreeable to starting an auto-PAP. I advised pt that an order will be sent to a DME, [address], and [facility] will call the pt within about one week after they file with the pt's insurance. [facility] will show the pt how to use the machine, fit for masks, and troubleshoot the auto-PAP if needed. A follow up appt was made for insurance purposes with Dr. Frances Furbish on 01/22/21 at 9:30am. Pt verbalized understanding to arrive 15 minutes early and bring their auto-PAP. A letter with all of this information in it will be mailed to the pt as a reminder. I verified with the pt that the address we have on file is correct. Pt verbalized understanding of results. Pt had no questions at this time but was encouraged to call back if questions arise. I have sent the order to [facility] and have received confirmation that they have received the order.

## 2020-09-24 NOTE — Telephone Encounter (Signed)
I called pt. No answer, left a message asking pt to call me back.   

## 2020-09-24 NOTE — Telephone Encounter (Signed)
Pt called, DME company is out of network. Have some questions about the results in MyChart. Would like a call from the nurse to give the information of DME company in network.

## 2020-09-25 NOTE — Telephone Encounter (Signed)
I called the pt back. She sts [place] is out of net work for her DME supplies for CPAP. Insurance has advised in net work DME; [facility], [place].  Pt also reported when she called to check on the DME companies she inquired why the CPAP titration study was denied through her insurance and the representative she spoke with stated no denial was on file and no claim was pending.   Pt was advised I would send a message to our sleep lab manager on this to help investigate/explain.  I advised once we received feed back on the titration I would send order to [facility] if we needed to proceed with autopap order.

## 2020-09-25 NOTE — Telephone Encounter (Signed)
Pt. called RN back, please call her back on cell [phone].

## 2020-09-26 ENCOUNTER — Other Ambulatory Visit: Payer: Self-pay | Admitting: Family Medicine

## 2020-09-26 NOTE — Telephone Encounter (Signed)
Noted, thank you.  I have called the pt and updated. I have sent order to [facility] and pt will keep appt as scheduled for may. We can move out if need be.

## 2020-09-26 NOTE — Telephone Encounter (Signed)
Doctor ordered HST for new machine and ordered Auto for patient. A CPAP was never ordered so we did not submit an authorization. She does not need an in lab study since she is a cpap user.

## 2020-10-05 ENCOUNTER — Other Ambulatory Visit: Payer: Self-pay

## 2020-10-05 DIAGNOSIS — K219 Gastro-esophageal reflux disease without esophagitis: Secondary | ICD-10-CM | POA: Insufficient documentation

## 2020-10-08 ENCOUNTER — Encounter: Payer: Self-pay | Admitting: Family Medicine

## 2020-10-08 ENCOUNTER — Other Ambulatory Visit: Payer: Self-pay

## 2020-10-08 ENCOUNTER — Ambulatory Visit (INDEPENDENT_AMBULATORY_CARE_PROVIDER_SITE_OTHER): Payer: PRIVATE HEALTH INSURANCE | Admitting: Family Medicine

## 2020-10-08 VITALS — BP 132/78 | HR 68 | Temp 97.5°F | Resp 16 | Ht 66.5 in | Wt 256.8 lb

## 2020-10-08 DIAGNOSIS — E7800 Pure hypercholesterolemia, unspecified: Secondary | ICD-10-CM | POA: Diagnosis not present

## 2020-10-08 DIAGNOSIS — I10 Essential (primary) hypertension: Secondary | ICD-10-CM | POA: Diagnosis not present

## 2020-10-08 DIAGNOSIS — Z0000 Encounter for general adult medical examination without abnormal findings: Secondary | ICD-10-CM | POA: Diagnosis not present

## 2020-10-08 DIAGNOSIS — E890 Postprocedural hypothyroidism: Secondary | ICD-10-CM

## 2020-10-08 DIAGNOSIS — R7301 Impaired fasting glucose: Secondary | ICD-10-CM | POA: Diagnosis not present

## 2020-10-08 LAB — COMPREHENSIVE METABOLIC PANEL
ALT: 21 U/L (ref 0–35)
AST: 22 U/L (ref 0–37)
Albumin: 4.5 g/dL (ref 3.5–5.2)
Alkaline Phosphatase: 64 U/L (ref 39–117)
BUN: 11 mg/dL (ref 6–23)
CO2: 26 mEq/L (ref 19–32)
Calcium: 9.7 mg/dL (ref 8.4–10.5)
Chloride: 103 mEq/L (ref 96–112)
Creatinine, Ser: 0.85 mg/dL (ref 0.40–1.20)
GFR: 73.23 mL/min (ref >60.00)
Glucose, Bld: 107 mg/dL — ABNORMAL HIGH (ref 70–99)
Potassium: 3.7 mEq/L (ref 3.5–5.1)
Sodium: 140 mEq/L (ref 135–145)
Total Bilirubin: 1.2 mg/dL (ref 0.2–1.2)
Total Protein: 6.6 g/dL (ref 6.0–8.3)

## 2020-10-08 LAB — LIPID PANEL
Cholesterol: 143 mg/dL (ref 0–200)
HDL: 54.8 mg/dL (ref >39.00)
LDL Cholesterol: 62 mg/dL (ref 0–99)
NonHDL: 88.49
Total CHOL/HDL Ratio: 3
Triglycerides: 130 mg/dL (ref 0.0–149.0)
VLDL: 26 mg/dL (ref 0.0–40.0)

## 2020-10-08 LAB — TSH: TSH: 2.08 u[IU]/mL (ref 0.35–4.50)

## 2020-10-08 LAB — HEMOGLOBIN A1C: Hgb A1c MFr Bld: 5.6 % (ref 4.6–6.5)

## 2020-10-08 NOTE — Progress Notes (Signed)
Office Note 10/08/2020  CC:  Chief Complaint  Patient presents with  . Annual Exam    Pt is fasting    HPI:  Theresa Young is a 65 y.o. White female who is here for annual health maintenance exam and f/u HTN, HLD, and postsurg hypothyroidism. A/P as of last visit on 07/13/20: "1) Generalized abdominal pain. Features c/w functional GI pain but some question of midline epigastric distention worrisome for ventral hernia.  Also question whether or not this is all abd wall pain. CBC w/diff and cmet now. Check plain abd films x  2view and check CT abd pelv w/contrast to further eval for possible ventral hernia.  2) She has OSA and uses CPAP. Needs to get established with sleep MD to get monitored for compliance/efficacy/need for new machine, supplies, etc. Referral ordered today."  INTERIM HX: Labs and CT imaging normal after last visit. No further signif abd pain prob since last visit.  HTN: occ home monitoring wnl.  She is going to try to get on a good diabetic diet any time now b/c her husband has to do this. Taking rosuva qd w/out problem.  Hypoth: Takes 112 mcg tab of T4 on empty stomach w/out any other meds.  ROS: no fevers, no CP, no SOB, no wheezing, no cough, no dizziness, no HAs, no rashes, no melena/hematochezia.  No polyuria or polydipsia.  No myalgias or arthralgias.  No focal weakness, paresthesias, or tremors.  No acute vision or hearing abnormalities. No n/v/d or abd pain.  No palpitations.    Past Medical History:  Diagnosis Date  . Carpal tunnel syndrome   . Cervical spondylosis   . Depression   . GERD (gastroesophageal reflux disease)   . History of MRSA infection    R hand soft tissue  . Hyperlipidemia   . Hypertension   . IFG (impaired fasting glucose)    2019/20->A1c 5.5%  . Migraine syndrome    occasional  . Obesity, Class II, BMI 35-39.9   . OSA on CPAP 11/2013   severe; needs compliance monitoring->referred to neurologist ([facility])  .  Osteoarthritis    Knees, ankles, fingers, wrists, sometimes hips.  Occ prn NSAID  . 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 Vitamin D deficiency 04/2019   22 ng/ml Sept 29, 2020.    Past Surgical History:  Procedure Laterality Date  . APPENDECTOMY  6 yrs ago   Dr. Lovell SheehanJenkins  . COLONOSCOPY  2012; 02/20/20   approx 2012, normal. 01/2020 polyp x 1->per Dr. Loreta AveMann recall 5-10 yrs  . CYSTOSCOPY     for pain in side; normal  . FOOT SURGERY Right 2017   dorsal tarsal exostectomy, endoscopic plantar fasciotomy, bunionectomy with screw and removal of toenail 2nd toe R foot.  Marland Kitchen. GANGLION CYST EXCISION    . polysomnogram  11/2013   severe OSA  . THYROIDECTOMY  12/10/2011   Bx benign.  Enlarging goiter.  Procedure: THYROIDECTOMY;  Surgeon: Dalia HeadingMark A Jenkins, MD;  Location: [facility];  Service: General;  Laterality: N/A;  Total Thyroidectomy  . TRANSTHORACIC ECHOCARDIOGRAM  05/17/2015   EF 65-70%, mod LVH, normal wall motion, grd I DD, mild MR.    Family History  Problem Relation Age of Onset  . Hypertension Mother   . Cancer Mother        lung  . Migraines Mother   . Ulcers Mother   . Hypertension Father   . Migraines Father   . Heart disease Father   . Ulcers Father   .  Early death Sister   . Diabetes Sister   . Diabetes Paternal Grandmother   . Kidney disease Paternal Grandfather        kidney failure  . Heart attack Paternal Grandfather   . Early death Sister   . Obesity Sister   . Hypertension Sister   . Migraines Sister   . Cancer Sister        ? cervical cancer  . Multiple sclerosis Sister   . Breast cancer Sister   . Pseudochol deficiency Neg Hx   . Malignant hyperthermia Neg Hx   . Hypotension Neg Hx   . Anesthesia problems Neg Hx     Social History   Socioeconomic History  . Marital status: Married    Spouse name: Not on file  . Number of children: Not on file  . Years of education: Not on file  . Highest education level: Not on file  Occupational History  . Not on  file  Tobacco Use  . Smoking status: Never Smoker  . Smokeless tobacco: Never Used  Vaping Use  . Vaping Use: Never used  Substance and Sexual Activity  . Alcohol use: Yes    Comment: very rarely  . Drug use: No  . Sexual activity: Yes    Birth control/protection: Post-menopausal  Other Topics Concern  . Not on file  Social History Narrative   Married, 3 step children.  She raised her nephew who has special needs.   Educ: masters deg   Occup: retired Psychologist, prison and probation services   Rare alcohol.   No tob.   Social Determinants of Health   Financial Resource Strain: Low Risk   . Difficulty of Paying Living Expenses: Not hard at all  Food Insecurity: No Food Insecurity  . Worried About Programme researcher, broadcasting/film/video in the Last Year: Never true  . Ran Out of Food in the Last Year: Never true  Transportation Needs: No Transportation Needs  . Lack of Transportation (Medical): No  . Lack of Transportation (Non-Medical): No  Physical Activity: Insufficiently Active  . Days of Exercise per Week: 2 days  . Minutes of Exercise per Session: 20 min  Stress: No Stress Concern Present  . Feeling of Stress : Only a little  Social Connections: Socially Integrated  . Frequency of Communication with Friends and Family: More than three times a week  . Frequency of Social Gatherings with Friends and Family: Once a week  . Attends Religious Services: More than 4 times per year  . Active Member of Clubs or Organizations: Yes  . Attends Banker Meetings: More than 4 times per year  . Marital Status: Married  Catering manager Violence: Not At Risk  . Fear of Current or Ex-Partner: No  . Emotionally Abused: No  . Physically Abused: No  . Sexually Abused: No    Outpatient Medications Prior to Visit  Medication Sig Dispense Refill  . Cholecalciferol (VITAMIN D) 50 MCG (2000 UT) CAPS Take by mouth daily.     Marland Kitchen eletriptan (RELPAX) 40 MG tablet Take 40 mg by mouth as needed. may repeat in 2  hours if necessary for migraines    . famotidine (PEPCID) 20 MG tablet     . levothyroxine (SYNTHROID) 112 MCG tablet TAKE (1) TABLET BY MOUTH EACH MORNING ON AN EMPTY STOMACH. 90 tablet 1  . losartan-hydrochlorothiazide (HYZAAR) 100-12.5 MG tablet TAKE (1) TABLET BY MOUTH ONCE DAILY. 90 tablet 3  . rosuvastatin (CRESTOR) 10 MG tablet TAKE ONE TABLET  BY MOUTH ONCE DAILY. 30 tablet 0   No facility-administered medications prior to visit.    Allergies  Allergen Reactions  . Bactrim [Sulfamethoxazole-Trimethoprim] Nausea And Vomiting  . Erythromycin Hives    REACTION: rash on trunk of body  . Iodinated Diagnostic Agents Swelling and Other (See Comments)    Reaction: itching,swelling around the eyes    ROS Review of Systems  Constitutional: Negative for appetite change, chills, fatigue and fever.  HENT: Negative for congestion, dental problem, ear pain and sore throat.   Eyes: Negative for discharge, redness and visual disturbance.  Respiratory: Negative for cough, chest tightness, shortness of breath and wheezing.   Cardiovascular: Negative for chest pain, palpitations and leg swelling.  Gastrointestinal: Negative for abdominal pain, blood in stool, diarrhea, nausea and vomiting.  Genitourinary: Negative for difficulty urinating, dysuria, flank pain, frequency, hematuria and urgency.  Musculoskeletal: Negative for arthralgias, back pain, joint swelling, myalgias and neck stiffness.  Skin: Negative for pallor and rash.  Neurological: Negative for dizziness, speech difficulty, weakness and headaches.  Hematological: Negative for adenopathy. Does not bruise/bleed easily.  Psychiatric/Behavioral: Negative for confusion and sleep disturbance. The patient is not nervous/anxious.     PE; Vitals with BMI 10/08/2020 08/27/2020 07/25/2020  Height 5' 6.5" 5\' 6"  5\' 6"   Weight 256 lbs 13 oz 258 lbs 257 lbs  BMI 40.83 41.66 41.5  Systolic 132 147  Diastolic 78 86 89  Pulse 68 66 84      Exam chaperoned by , CMA. Gen: Alert, well appearing.  Patient is oriented to person, place, time, and situation. AFFECT: pleasant, lucid thought and speech. ENT: Ears: EACs clear, normal epithelium.  TMs with good light reflex and landmarks bilaterally.  Eyes: no injection, icteris, swelling, or exudate.  EOMI, PERRLA. Nose: no drainage or turbinate edema/swelling.  No injection or focal lesion.  Mouth: lips without lesion/swelling.  Oral mucosa pink and moist.  Dentition intact and without obvious caries or gingival swelling.  Oropharynx without erythema, exudate, or swelling.  Neck: supple/nontender.  No LAD, mass, or TM.  Carotid pulses 2+ bilaterally, without bruits. CV: RRR, no m/r/g.   LUNGS: CTA bilat, nonlabored resps, good aeration in all lung fields. ABD: soft, NT, ND, BS normal.  No hepatospenomegaly or mass.  No bruits. EXT: no clubbing, cyanosis, or edema.  Musculoskeletal: no joint swelling, erythema, warmth, or tenderness.  ROM of all joints intact. Skin - no sores or suspicious lesions or rashes or color changes   Pertinent labs:  Lab Results  Component Value Date   TSH 0.83 04/06/2020   Lab Results  Component Value Date   WBC 7.4 07/13/2020   HGB 14.7 07/13/2020   HCT 42.5 07/13/2020   MCV 87.2 07/13/2020   PLT 195.0 07/13/2020   Lab Results  Component Value Date   CREATININE 0.95 08/27/2020   BUN 11 08/27/2020   NA 140 08/27/2020   K 4.0 08/27/2020   CL 103 08/27/2020   CO2 30 08/27/2020   Lab Results  Component Value Date   ALT 18 07/13/2020   AST 18 07/13/2020   ALKPHOS 67 07/13/2020   BILITOT 1.0 07/13/2020   Lab Results  Component Value Date   CHOL 134 04/06/2020   Lab Results  Component Value Date   HDL 57.10 04/06/2020   Lab Results  Component Value Date   LDLCALC 50 04/06/2020   Lab Results  Component Value Date   TRIG 134.0 04/06/2020   Lab Results  Component Value  Date   CHOLHDL 2 04/06/2020   Lab  Results  Component Value Date   HGBA1C 5.5 08/11/2019   ASSESSMENT AND PLAN:   1) HTN: good control. Lytes/cr today.  2) HLD, tolerating statin daily. FLP and hepatic panel today.  3) Hypothyroidism: TSH monitoring today.  4) Health maintenance exam: Reviewed age and gender appropriate health maintenance issues (prudent diet, regular exercise, health risks of tobacco and excessive alcohol, use of seatbelts, fire alarms in home, use of sunscreen).  Also reviewed age and gender appropriate health screening as well as vaccine recommendations. Vaccines: Tdap->return for nurse visit.  Shingrix->return for nurse visit.  Otherwise ALL UTD. Labs: fasting HP and A1c ordered. (IFG) Cervical ca screening: normal per GYN MD on 07/25/20. Breast ca screening: screening mammo 09/2019 NEG/NORMAL at GYN->to be arranged. Osteoporosis screening: via GYN Colon ca screening: hx polyp x 1 in 2021-->recall after 2026.  An After Visit Summary was printed and given to the patient.  FOLLOW UP:  Return in about 6 months (around 04/07/2021) for routine chronic illness f/u.  Signed:  Santiago Bumpers, MD           10/08/2020

## 2020-10-08 NOTE — Patient Instructions (Signed)
Health Maintenance, Female Adopting a healthy lifestyle and getting preventive care are important in promoting health and wellness. Ask your health care provider about:  The right schedule for you to have regular tests and exams.  Things you can do on your own to prevent diseases and keep yourself healthy. What should I know about diet, weight, and exercise? Eat a healthy diet  Eat a diet that includes plenty of vegetables, fruits, low-fat dairy products, and lean protein.  Do not eat a lot of foods that are high in solid fats, added sugars, or sodium.   Maintain a healthy weight Body mass index (BMI) is used to identify weight problems. It estimates body fat based on height and weight. Your health care provider can help determine your BMI and help you achieve or maintain a healthy weight. Get regular exercise Get regular exercise. This is one of the most important things you can do for your health. Most adults should:  Exercise for at least 150 minutes each week. The exercise should increase your heart rate and make you sweat (moderate-intensity exercise).  Do strengthening exercises at least twice a week. This is in addition to the moderate-intensity exercise.  Spend less time sitting. Even light physical activity can be beneficial. Watch cholesterol and blood lipids Have your blood tested for lipids and cholesterol at 63 years of age, then have this test every 5 years. Have your cholesterol levels checked more often if:  Your lipid or cholesterol levels are high.  You are older than 63 years of age.  You are at high risk for heart disease. What should I know about cancer screening? Depending on your health history and family history, you may need to have cancer screening at various ages. This may include screening for:  Breast cancer.  Cervical cancer.  Colorectal cancer.  Skin cancer.  Lung cancer. What should I know about heart disease, diabetes, and high blood  pressure? Blood pressure and heart disease  High blood pressure causes heart disease and increases the risk of stroke. This is more likely to develop in people who have high blood pressure readings, are of African descent, or are overweight.  Have your blood pressure checked: ? Every 3-5 years if you are 18-39 years of age. ? Every year if you are 40 years old or older. Diabetes Have regular diabetes screenings. This checks your fasting blood sugar level. Have the screening done:  Once every three years after age 40 if you are at a normal weight and have a low risk for diabetes.  More often and at a younger age if you are overweight or have a high risk for diabetes. What should I know about preventing infection? Hepatitis B If you have a higher risk for hepatitis B, you should be screened for this virus. Talk with your health care provider to find out if you are at risk for hepatitis B infection. Hepatitis C Testing is recommended for:  Everyone born from 1945 through 1965.  Anyone with known risk factors for hepatitis C. Sexually transmitted infections (STIs)  Get screened for STIs, including gonorrhea and chlamydia, if: ? You are sexually active and are younger than 63 years of age. ? You are older than 63 years of age and your health care provider tells you that you are at risk for this type of infection. ? Your sexual activity has changed since you were last screened, and you are at increased risk for chlamydia or gonorrhea. Ask your health care provider   if you are at risk.  Ask your health care provider about whether you are at high risk for HIV. Your health care provider may recommend a prescription medicine to help prevent HIV infection. If you choose to take medicine to prevent HIV, you should first get tested for HIV. You should then be tested every 3 months for as long as you are taking the medicine. Pregnancy  If you are about to stop having your period (premenopausal) and  you may become pregnant, seek counseling before you get pregnant.  Take 400 to 800 micrograms (mcg) of folic acid every day if you become pregnant.  Ask for birth control (contraception) if you want to prevent pregnancy. Osteoporosis and menopause Osteoporosis is a disease in which the bones lose minerals and strength with aging. This can result in bone fractures. If you are 65 years old or older, or if you are at risk for osteoporosis and fractures, ask your health care provider if you should:  Be screened for bone loss.  Take a calcium or vitamin D supplement to lower your risk of fractures.  Be given hormone replacement therapy (HRT) to treat symptoms of menopause. Follow these instructions at home: Lifestyle  Do not use any products that contain nicotine or tobacco, such as cigarettes, e-cigarettes, and chewing tobacco. If you need help quitting, ask your health care provider.  Do not use street drugs.  Do not share needles.  Ask your health care provider for help if you need support or information about quitting drugs. Alcohol use  Do not drink alcohol if: ? Your health care provider tells you not to drink. ? You are pregnant, may be pregnant, or are planning to become pregnant.  If you drink alcohol: ? Limit how much you use to 0-1 drink a day. ? Limit intake if you are breastfeeding.  Be aware of how much alcohol is in your drink. In the [place] one drink equals one 12 oz bottle of beer (355 mL), one 5 oz glass of wine (148 mL), or one 1 oz glass of hard liquor (44 mL). General instructions  Schedule regular health, dental, and eye exams.  Stay current with your vaccines.  Tell your health care provider if: ? You often feel depressed. ? You have ever been abused or do not feel safe at home. Summary  Adopting a healthy lifestyle and getting preventive care are important in promoting health and wellness.  Follow your health care provider's instructions about healthy  diet, exercising, and getting tested or screened for diseases.  Follow your health care provider's instructions on monitoring your cholesterol and blood pressure. This information is not intended to replace advice given to you by your health care provider. Make sure you discuss any questions you have with your health care provider. Document Revised: 08/04/2018 Document Reviewed: 08/04/2018 Elsevier Patient Education  2021 [employer]  

## 2020-10-29 ENCOUNTER — Encounter: Payer: Self-pay | Admitting: Family Medicine

## 2020-10-29 ENCOUNTER — Ambulatory Visit: Payer: PRIVATE HEALTH INSURANCE

## 2020-11-26 ENCOUNTER — Other Ambulatory Visit: Payer: Self-pay | Admitting: Family Medicine

## 2021-01-22 ENCOUNTER — Ambulatory Visit: Payer: Self-pay | Admitting: Neurology

## 2021-01-24 ENCOUNTER — Other Ambulatory Visit: Payer: Self-pay | Admitting: Family Medicine

## 2021-02-13 IMAGING — CT CT ABD-PELV W/ CM
2 of 5 series · 17 of 46 positions shown, 19 images · IV contrast (Omnipaque)
Comparison: CT 08/31/2006

CLINICAL DATA: No acute diffuse abdominal pain and distension.

EXAM:
CT ABDOMEN AND PELVIS WITH CONTRAST
TECHNIQUE: Multidetector CT imaging of the abdomen and pelvis was performed
using the standard protocol following bolus administration of
intravenous contrast.
CONTRAST:  100mL OMNIPAQUE IOHEXOL 300 MG/ML  SOLN

[Series 2: axial st · axial · 0.98mm/px · z∈[-625,-175]mm · 14 of 102 slices shown, 16 images]
[im 6/102  soft-tissue]
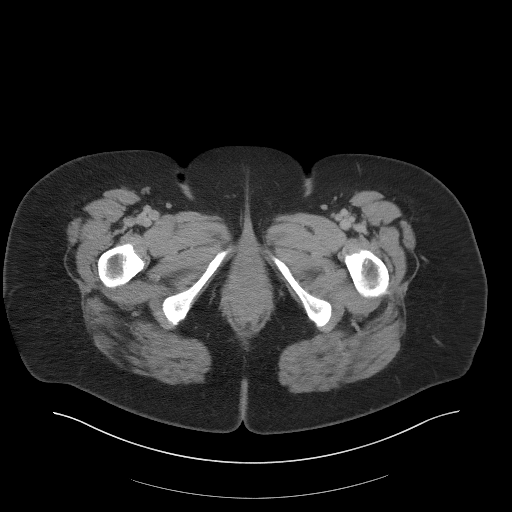
[im 6/102  bone]
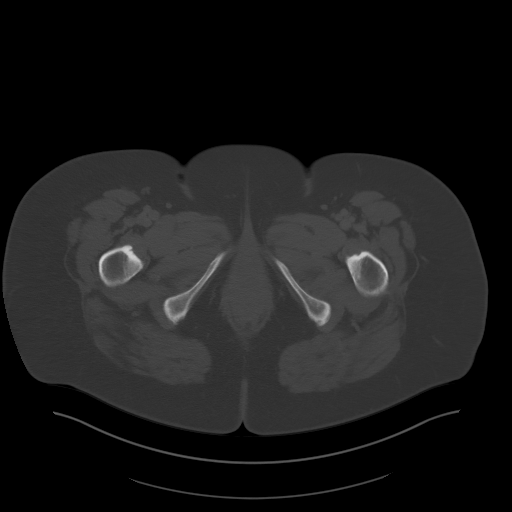
[im 12/102  soft-tissue]
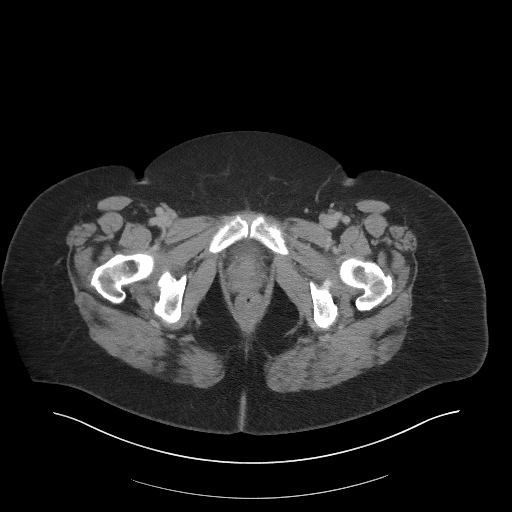
[im 23/102  soft-tissue]
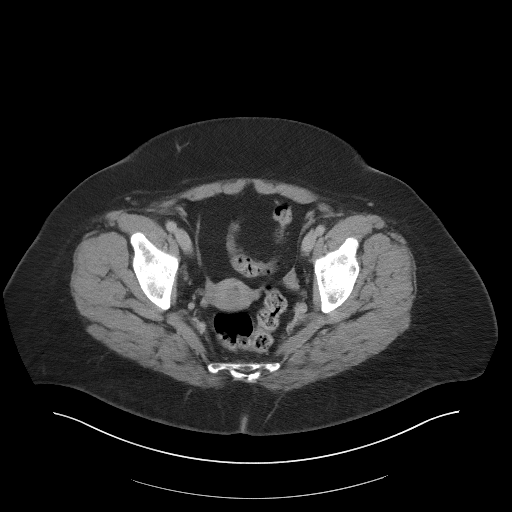
[im 29/102  soft-tissue]
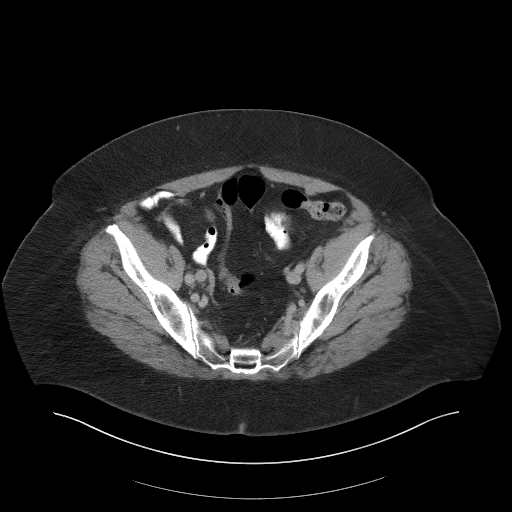
[im 34/102  soft-tissue]
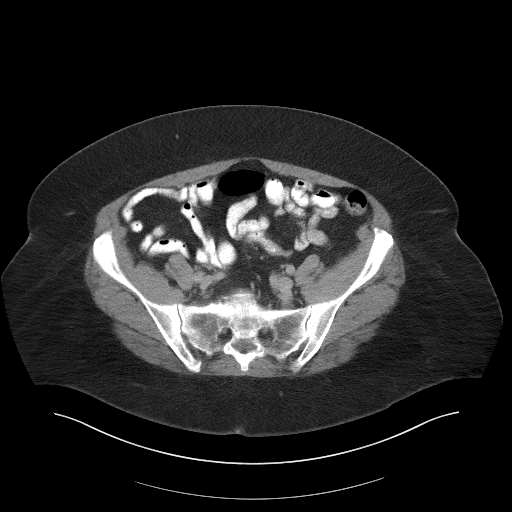
[im 40/102  soft-tissue]
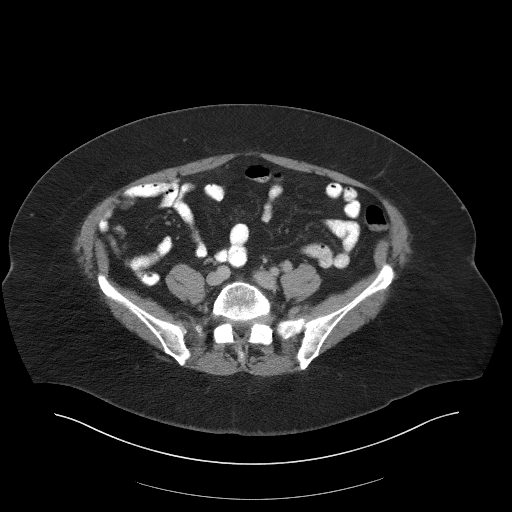
[im 45/102  soft-tissue]
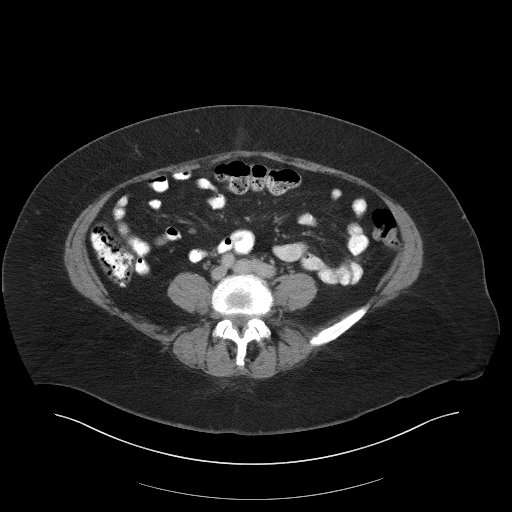
[im 57/102  soft-tissue]
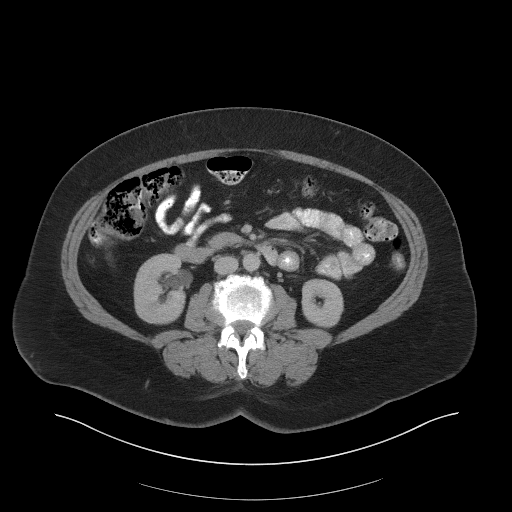
[im 62/102  soft-tissue]
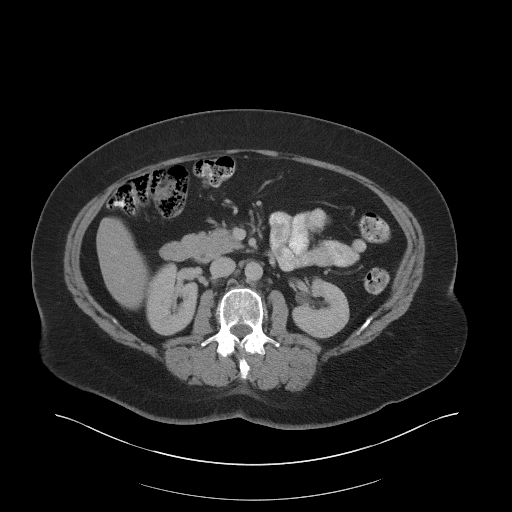
[im 62/102  bone]
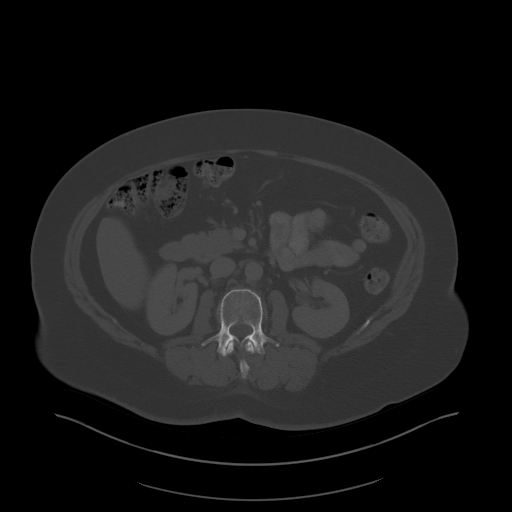
[im 68/102  soft-tissue]
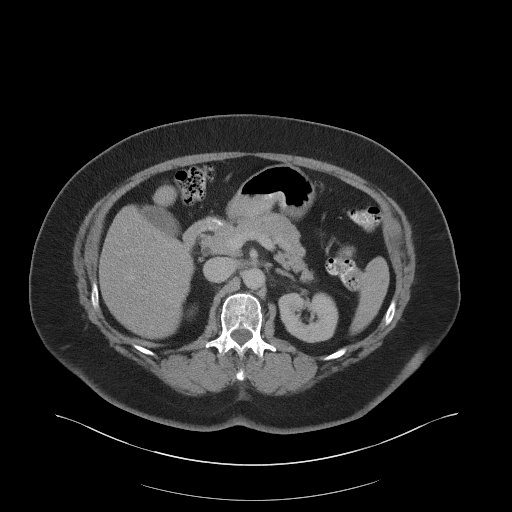
[im 73/102  soft-tissue]
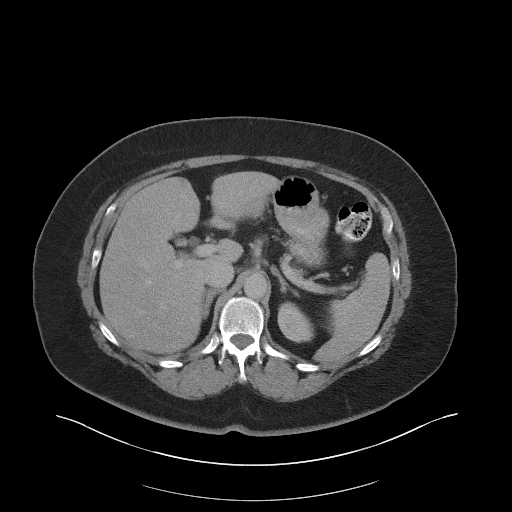
[im 79/102  soft-tissue]
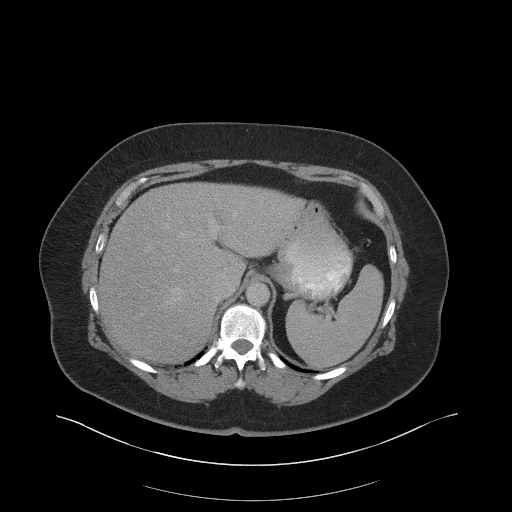
[im 90/102  soft-tissue]
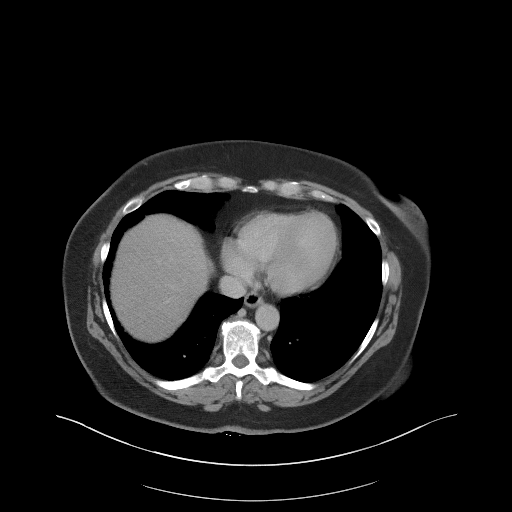
[im 96/102  soft-tissue]
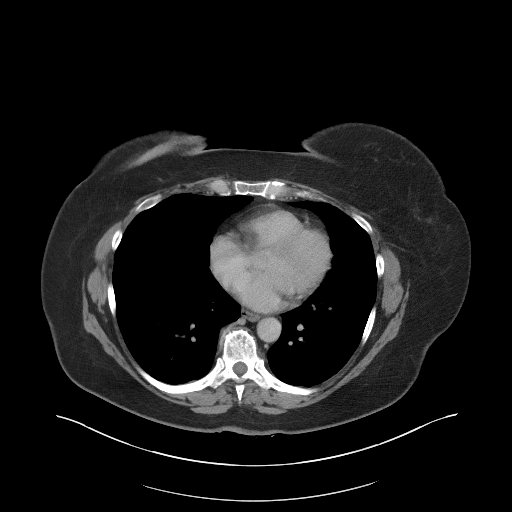

[Series 5: coronal st · coronal · 0.88mm/px · 3 of 109 slices shown]
[im 37/109  soft-tissue]
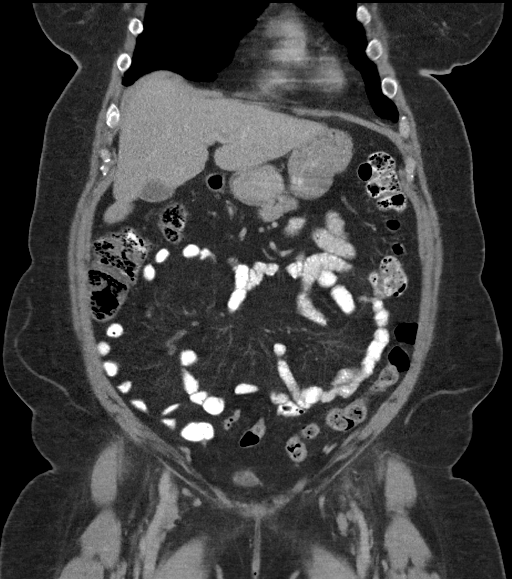
[im 49/109  soft-tissue]
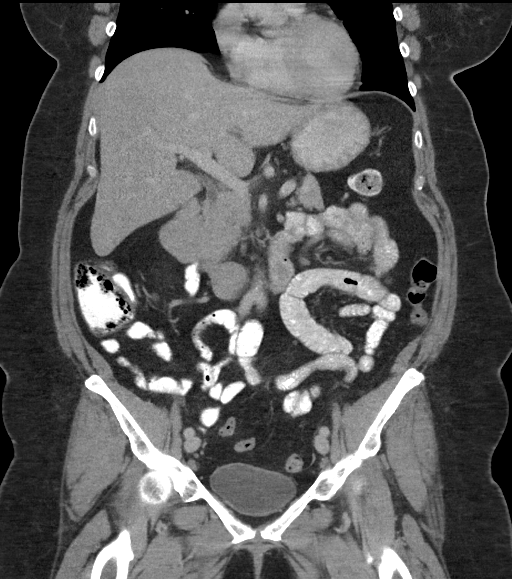
[im 61/109  soft-tissue]
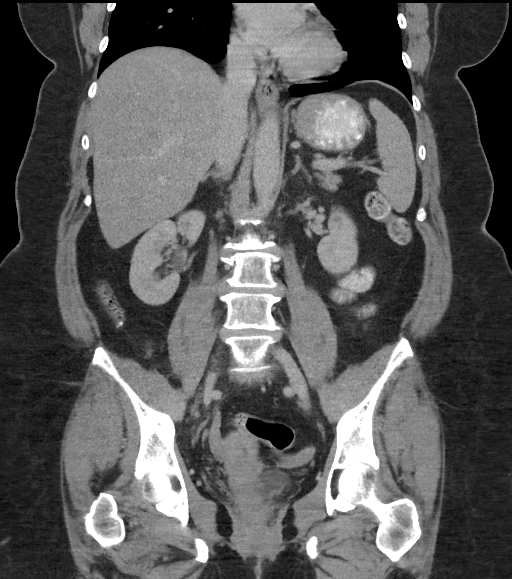

[17 of 46 positions shown; findings below may reference images not displayed]

FINDINGS: Lower chest: Lung bases are clear.

Hepatobiliary: No focal hepatic lesion. No biliary duct dilatation.
Common bile duct is normal.

Pancreas: Pancreas is normal. No ductal dilatation. No pancreatic
inflammation.

Spleen: Normal spleen

Adrenals/urinary tract: Adrenal glands and kidneys are normal. The
ureters and bladder normal.

Stomach/Bowel: Stomach, duodenum and small bowel normal. No small
bowel inflammation or dilatation. Terminal ileum is normal. Post
appendectomy. The ascending, transverse, and descending colon
normal. Rectosigmoid colon normal.

Vascular/Lymphatic: Abdominal aorta is normal caliber. No periportal
or retroperitoneal adenopathy. No pelvic adenopathy.

Reproductive: Uterus and adnexa unremarkable.

Other: Small fat filled umbilical hernia measures 33 mm. The hernia
mouth measures 11 mm (image 54/2). No inguinal hernia

Musculoskeletal: No aggressive osseous lesion.
IMPRESSION: 1. No acute findings in the abdomen or pelvis.
2. Small umbilical hernia.
3. No inflammation or obstruction of the bowel.

## 2021-03-12 ENCOUNTER — Telehealth: Payer: Self-pay

## 2021-03-12 NOTE — Telephone Encounter (Signed)
Received Mammogram results from [facility] on 03/12/21. Will place on PCP desk for review.

## 2021-03-13 NOTE — Telephone Encounter (Signed)
Signed and put in box to go up front. Signed:  Phil Addilyne Backs, MD           03/13/2021  

## 2021-04-08 ENCOUNTER — Ambulatory Visit: Payer: PRIVATE HEALTH INSURANCE | Admitting: Family Medicine

## 2021-04-18 ENCOUNTER — Ambulatory Visit (INDEPENDENT_AMBULATORY_CARE_PROVIDER_SITE_OTHER): Payer: No Typology Code available for payment source | Admitting: Family Medicine

## 2021-04-18 ENCOUNTER — Encounter: Payer: Self-pay | Admitting: Family Medicine

## 2021-04-18 ENCOUNTER — Other Ambulatory Visit: Payer: Self-pay

## 2021-04-18 VITALS — BP 141/84 | HR 69 | Temp 97.9°F | Resp 16 | Ht 66.5 in | Wt 261.8 lb

## 2021-04-18 DIAGNOSIS — E890 Postprocedural hypothyroidism: Secondary | ICD-10-CM

## 2021-04-18 DIAGNOSIS — E7800 Pure hypercholesterolemia, unspecified: Secondary | ICD-10-CM | POA: Diagnosis not present

## 2021-04-18 DIAGNOSIS — I10 Essential (primary) hypertension: Secondary | ICD-10-CM | POA: Diagnosis not present

## 2021-04-18 DIAGNOSIS — Z23 Encounter for immunization: Secondary | ICD-10-CM | POA: Diagnosis not present

## 2021-04-18 DIAGNOSIS — Z0000 Encounter for general adult medical examination without abnormal findings: Secondary | ICD-10-CM | POA: Diagnosis not present

## 2021-04-18 LAB — COMPREHENSIVE METABOLIC PANEL
ALT: 20 U/L (ref 0–35)
AST: 20 U/L (ref 0–37)
Albumin: 4.4 g/dL (ref 3.5–5.2)
Alkaline Phosphatase: 55 U/L (ref 39–117)
BUN: 10 mg/dL (ref 6–23)
CO2: 27 mEq/L (ref 19–32)
Calcium: 10.4 mg/dL (ref 8.4–10.5)
Chloride: 104 mEq/L (ref 96–112)
Creatinine, Ser: 0.87 mg/dL (ref 0.40–1.20)
GFR: 70.95 mL/min (ref >60.00)
Glucose, Bld: 103 mg/dL — ABNORMAL HIGH (ref 70–99)
Potassium: 4 mEq/L (ref 3.5–5.1)
Sodium: 141 mEq/L (ref 135–145)
Total Bilirubin: 0.8 mg/dL (ref 0.2–1.2)
Total Protein: 6.4 g/dL (ref 6.0–8.3)

## 2021-04-18 LAB — LIPID PANEL
Cholesterol: 141 mg/dL (ref 0–200)
HDL: 58.3 mg/dL (ref >39.00)
LDL Cholesterol: 57 mg/dL (ref 0–99)
NonHDL: 82.41
Total CHOL/HDL Ratio: 2
Triglycerides: 126 mg/dL (ref 0.0–149.0)
VLDL: 25.2 mg/dL (ref 0.0–40.0)

## 2021-04-18 LAB — TSH: TSH: 3.04 u[IU]/mL (ref 0.35–5.50)

## 2021-04-18 MED ORDER — LOSARTAN POTASSIUM-HCTZ 100-12.5 MG PO TABS: ORAL_TABLET | ORAL | 3 refills | Status: DC

## 2021-04-18 MED ORDER — ROSUVASTATIN CALCIUM 10 MG PO TABS: 10.0000 mg | ORAL_TABLET | Freq: Every day | ORAL | 3 refills | Status: DC

## 2021-04-18 NOTE — Progress Notes (Signed)
OFFICE VISIT  04/18/2021  CC:  Chief Complaint  Patient presents with   Follow-up    [facility]CI, pt is fasting   HPI:    Patient is a 65 y.o. Caucasian female who presents for 6 mo f/u HTN, HLD, and postsurgical hypothyroidism. At the time of last visit 6 mo ago all was stable and no treatment changes were made.  Feeling well.  Just got back from spending a week at [facility].  HTN: only occ home bp check, usually <130/80.  Hypoth: Takes T4 on empty stomach w/out any other meds.  Crestor 10mg  qd w/out side effect.  ROS as above, plus--> no fevers, no CP, no SOB, no wheezing, no cough, no dizziness, no HAs, no rashes, no melena/hematochezia.  No polyuria or polydipsia.  No myalgias or arthralgias.  No focal weakness, paresthesias, or tremors.  No acute vision or hearing abnormalities.  No dysuria or unusual/new urinary urgency or frequency.  No recent changes in lower legs. No n/v/d or abd pain.  No palpitations.    Past Medical History:  Diagnosis Date   Abdominal pain 2021   CT abd neg. suspect abd wall pain vs functional GI pain   Carpal tunnel syndrome    Cervical spondylosis    Depression    GERD (gastroesophageal reflux disease)    History of MRSA infection    R hand soft tissue   Hyperlipidemia    Hypertension    IFG (impaired fasting glucose)    A1c's 5.5-5.6% 2019-2022   Migraine syndrome    occasional   Obesity, Class II, BMI 35-39.9    OSA on CPAP 11/2013   severe; needs compliance monitoring->referred to neurologist ([facility])   Osteoarthritis    Knees, ankles, fingers, wrists, sometimes hips.  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    Past Surgical History:  Procedure Laterality Date   APPENDECTOMY  6 yrs ago   Dr. 11-15-2005   COLONOSCOPY  2012; 02/20/20   approx 2012, normal. 01/2020 polyp x 1->per Dr. 02/2020 recall 5-10 yrs   CYSTOSCOPY     for pain in side; normal   FOOT SURGERY Right  2017   dorsal tarsal exostectomy, endoscopic plantar fasciotomy, bunionectomy with screw and removal of toenail 2nd toe R foot.   GANGLION CYST EXCISION     polysomnogram  11/2013   severe OSA   THYROIDECTOMY  12/10/2011   Bx benign.  Enlarging goiter.  Procedure: THYROIDECTOMY;  Surgeon: 12/12/2011, MD;  Location: [facility];  Service: General;  Laterality: N/A;  Total Thyroidectomy   TRANSTHORACIC ECHOCARDIOGRAM  05/17/2015   EF 65-70%, mod LVH, normal wall motion, grd I DD, mild MR.    Outpatient Medications Prior to Visit  Medication Sig Dispense Refill   Cholecalciferol (VITAMIN D) 50 MCG (2000 UT) CAPS Take by mouth daily.      eletriptan (RELPAX) 40 MG tablet Take 40 mg by mouth as needed. may repeat in 2 hours if necessary for migraines     famotidine (PEPCID) 20 MG tablet      levothyroxine (SYNTHROID) 112 MCG tablet TAKE (1) TABLET BY MOUTH EACH MORNING ON AN EMPTY STOMACH. 90 tablet 1   losartan-hydrochlorothiazide (HYZAAR) 100-12.5 MG tablet TAKE (1) TABLET BY MOUTH ONCE DAILY. 90 tablet 3   rosuvastatin (CRESTOR) 10 MG tablet TAKE ONE TABLET BY MOUTH ONCE DAILY. 90 tablet 1   No facility-administered medications prior to  visit.    Allergies  Allergen Reactions   Bactrim [Sulfamethoxazole-Trimethoprim] Nausea And Vomiting   Erythromycin Hives    REACTION: rash on trunk of body   Iodinated Diagnostic Agents Swelling and Other (See Comments)    Reaction: itching,swelling around the eyes    ROS As per HPI  PE: Vitals with BMI 04/18/2021 10/08/2020 08/27/2020  Height 5' 6.5" 5' 6.5" 5\' 6"   Weight 261 lbs 13 oz 256 lbs 13 oz 258 lbs  BMI 41.63 40.83 41.66  Systolic 141 132  Diastolic 84 78 86  Pulse 69 68 66    Gen: Alert, well appearing.  Patient is oriented to person, place, time, and situation. AFFECT: pleasant, lucid thought and speech. CV: RRR, no m/r/g.   LUNGS: CTA bilat, nonlabored resps, good aeration in all lung fields. EXT: no clubbing or cyanosis.   no edema.    LABS:  Lab Results  Component Value Date   TSH 2.08 10/08/2020   Lab Results  Component Value Date   WBC 7.4 07/13/2020   HGB 14.7 07/13/2020   HCT 42.5 07/13/2020   MCV 87.2 07/13/2020   PLT 195.0 07/13/2020   Lab Results  Component Value Date   CREATININE 0.85 10/08/2020   BUN 11 10/08/2020   NA 140 10/08/2020   K 3.7 10/08/2020   CL 103 10/08/2020   CO2 26 10/08/2020   Lab Results  Component Value Date   ALT 21 10/08/2020   AST 22 10/08/2020   ALKPHOS 64 10/08/2020   BILITOT 1.2 10/08/2020   Lab Results  Component Value Date   CHOL 143 10/08/2020   Lab Results  Component Value Date   HDL 54.80 10/08/2020   Lab Results  Component Value Date   LDLCALC 62 10/08/2020   Lab Results  Component Value Date   TRIG 130.0 10/08/2020   Lab Results  Component Value Date   CHOLHDL 3 10/08/2020   Lab Results  Component Value Date   HGBA1C 5.6 10/08/2020   IMPRESSION AND PLAN:  1) HTN: bp a little up here today. No changes, though.  I asked her to start monitoring bp a little more at home. She is pretty apprehensive about bp med changes/additions anyway. Losartan-hctz 100-12.5, 1 qd. Lytes/cr today.  2) HLD: tolerating crestor 10mg  qd. FLP and hepatic panel today.  3) Postsurg hypothyroidism: cont 112 mcg synthroid qd. TSH today.  4) Preventative health care:  Tdap and shingrix #1 today.  An After Visit Summary was printed and given to the patient.  FOLLOW UP: Return in about 6 months (around 10/19/2021) for annual CPE (fasting).  Signed:  , MD           04/18/2021

## 2021-04-18 NOTE — Addendum Note (Signed)
Addended by: Emi Holes D on: 04/18/2021 10:49 AM   Modules accepted: Orders

## 2021-06-11 ENCOUNTER — Ambulatory Visit (INDEPENDENT_AMBULATORY_CARE_PROVIDER_SITE_OTHER): Payer: No Typology Code available for payment source | Admitting: Family Medicine

## 2021-06-11 ENCOUNTER — Other Ambulatory Visit: Payer: Self-pay

## 2021-06-11 ENCOUNTER — Encounter: Payer: Self-pay | Admitting: Family Medicine

## 2021-06-11 VITALS — BP 155/77 | HR 68 | Ht 66.5 in | Wt 266.8 lb

## 2021-06-11 DIAGNOSIS — G4733 Obstructive sleep apnea (adult) (pediatric): Secondary | ICD-10-CM

## 2021-06-11 DIAGNOSIS — Z9989 Dependence on other enabling machines and devices: Secondary | ICD-10-CM | POA: Diagnosis not present

## 2021-06-11 NOTE — Progress Notes (Signed)
PATIENT: Jolaine Fryberger Croft DOB: 04/15/1956  REASON FOR VISIT: follow up HISTORY FROM: patient  Chief Complaint  Patient presents with   Obstructive Sleep Apnea    New rm, alone. Here for initial CPAP f/u. Pt reports doing well. Pt reports humidity may be to warm. Pt will use CPAP when taking naps. [id number]     HISTORY OF PRESENT ILLNESS:  06/11/21 ALL:  VELIA PAMER is a 65 y.o. female here today for follow up for OSA on CPAP.  She was previously on CPAP and needed new machine. HST 09/17/2020 confirmed severe OSA with total AHI of 72.3/hr and O2 nadir of 82%. AutoPAP ordered. She is doing fairly well on her new machine. She does not like the heated humidity. She would prefer to have heat turned off. She is not sure about the different settings or how to adjust humidity setting on her new machine. She also reports hearing a clicking noise every night as her machine is ramping. She denies concerns with supplies. She is resting well. She can not sleep without her machine.   Compliance report dated 05/12/2021-06/10/2021 shows that she used CPAP 30/30 days for greater than 4 hours. Average usage was 7hr . Residual AHI was 2 on 7-13cmH20. Pressure in the 95th percentile of 12.8. NO significant leak noted.   HISTORY: (copied from Dr Teofilo Pod previous note)  Dear Dr. Milinda Cave,    I saw your patient, Daisie Haft, upon your kind request in my sleep clinic today for initial consultation of her sleep disorder, in particular, evaluation of her prior diagnosis of obstructive sleep apnea.  The patient is unaccompanied today.  As you know, Ms. Rathje is a 65 year old right-handed woman with an underlying medical history of hypertension, hyperlipidemia, hypothyroidism, vitamin D deficiency, osteoarthritis, migraines (followed by Dr. Neale Burly), carpal tunnel syndrome, reflux disease, depression, cervical spondylosis, and severe obesity with a BMI of over 40, who was previously diagnosed with obstructive  sleep apnea and placed on positive airway pressure treatment.  I reviewed your office note from 07/13/2020.  I was able to review her sleep study from 12/19/2013.  Study was interpreted by Dr. Gerilyn Pilgrim time.  She probably had a split-night sleep study.  Baseline AHI was reportedly 51/h, O2 nadir 79% with a baseline oxygen saturation of 97%.  She was noted to have frequent PVCs.  She noted was noted to respond well to CPAP at 10 cm. Her Epworth sleepiness score is 12 out of 24, fatigue severity score 25 out of 63.  She lives with her husband and they are raising a great nephew, age 52.  She takes him to school.  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  Bedtime is generally between 930 and 10 and rise time around 630, latest by 6:45 AM.  She denies recurrent morning headaches but does have nocturia about once per average night.  She suspects that her father had sleep apnea, he died in his 68s from heart disease.  She uses a Primary school teacher medium size full facemask, sometimes she notices the leak.  I was able to review her CPAP compliance data from her machine.  She has a S9 escape from [employer].  Pressure at 10 cm, leak and residual AHI information is unfortunately not available.  From 07/28/2020 through 08/26/2020 she has used her machine every night with percent use days greater than 4 hours at 100%, indicating superb compliance, average usage of  7 hours and 52 minutes.  He does have some difficulty maintaining sleep at times.  She does not take anything for sleep.  Some years ago she tried melatonin but had vivid dreams.  She follows with Dr. Neale Burly once a year.  She was able to wean off of imipramine for migraine prevention and uses eletriptan infrequently for acute migraines.  She has worked on weight loss.  She is working with a program through her insurance. She drinks caffeine in the form of soda or tea, about 2 servings per day on average.  She drinks  alcohol occasionally.  She is a non-smoker.  They have no indoor pets, they have outdoor cats.   REVIEW OF SYSTEMS: Out of a complete 14 system review of symptoms, the patient complains only of the following symptoms, fatigue, anxiety and all other reviewed systems are negative.  ESS: 15  ALLERGIES: Allergies  Allergen Reactions   Bactrim [Sulfamethoxazole-Trimethoprim] Nausea And Vomiting   Erythromycin Hives    REACTION: rash on trunk of body   Iodinated Diagnostic Agents Swelling and Other (See Comments)    Reaction: itching,swelling around the eyes    HOME MEDICATIONS: Outpatient Medications Prior to Visit  Medication Sig Dispense Refill   Cholecalciferol (VITAMIN D) 50 MCG (2000 UT) CAPS Take by mouth daily.      eletriptan (RELPAX) 40 MG tablet Take 40 mg by mouth as needed. may repeat in 2 hours if necessary for migraines     famotidine (PEPCID) 20 MG tablet      levothyroxine (SYNTHROID) 112 MCG tablet TAKE (1) TABLET BY MOUTH EACH MORNING ON AN EMPTY STOMACH. 90 tablet 1   losartan-hydrochlorothiazide (HYZAAR) 100-12.5 MG tablet TAKE (1) TABLET BY MOUTH ONCE DAILY. 90 tablet 3   rosuvastatin (CRESTOR) 10 MG tablet Take 1 tablet (10 mg total) by mouth daily. 90 tablet 3   No facility-administered medications prior to visit.    PAST MEDICAL HISTORY: Past Medical History:  Diagnosis Date   Abdominal pain 2021   CT abd neg. suspect abd wall pain vs functional GI pain   Carpal tunnel syndrome    Cervical spondylosis    Depression    GERD (gastroesophageal reflux disease)    History of MRSA infection    R hand soft tissue   Hyperlipidemia    Hypertension    IFG (impaired fasting glucose)    A1c's 5.5-5.6% 2019-2022   Migraine syndrome    occasional   Obesity, Class II, BMI 35-39.9    OSA on CPAP 11/2013   severe; needs compliance monitoring->referred to neurologist ([facility])   Osteoarthritis    Knees, ankles, fingers, wrists, sometimes hips.  Occ prn NSAID    Postsurgical hypothyroidism 12/21/2014   Vitamin D deficiency 04/2019   22 ng/ml Sept 29, 2020.    PAST SURGICAL HISTORY: Past Surgical History:  Procedure Laterality Date   APPENDECTOMY  6 yrs ago   Dr. Lovell Sheehan   COLONOSCOPY  2012; 02/20/20   approx 2012, normal. 01/2020 polyp x 1->per Dr. Loreta Ave recall 5-10 yrs   CYSTOSCOPY     for pain in side; normal   FOOT SURGERY Right 2017   dorsal tarsal exostectomy, endoscopic plantar fasciotomy, bunionectomy with screw and removal of toenail 2nd toe R foot.   GANGLION CYST EXCISION     polysomnogram  11/2013   severe OSA   THYROIDECTOMY  12/10/2011   Bx benign.  Enlarging goiter.  Procedure: THYROIDECTOMY;  Surgeon: Dalia Heading, MD;  Location: [facility];  Service: General;  Laterality: N/A;  Total Thyroidectomy   TRANSTHORACIC ECHOCARDIOGRAM  05/17/2015   EF 65-70%, mod LVH, normal wall motion, grd I DD, mild MR.    FAMILY HISTORY: Family History  Problem Relation Age of Onset   Hypertension Mother    Cancer Mother        lung   Migraines Mother    Ulcers Mother    Hypertension Father    Migraines Father    Heart disease Father    Ulcers Father    Early death Sister    Diabetes Sister    Diabetes Paternal Grandmother    Kidney disease Paternal Grandfather        kidney failure   Heart attack Paternal Grandfather    Early death Sister    Obesity Sister    Hypertension Sister    Migraines Sister    Cancer Sister        ? cervical cancer   Multiple sclerosis Sister    Breast cancer Sister    Pseudochol deficiency Neg Hx    Malignant hyperthermia Neg Hx    Hypotension Neg Hx    Anesthesia problems Neg Hx     SOCIAL HISTORY: Social History   Socioeconomic History   Marital status: Married    Spouse name: Not on file   Number of children: Not on file   Years of education: Not on file   Highest education level: Not on file  Occupational History   Not on file  Tobacco Use   Smoking status: Never   Smokeless tobacco:  Never  Vaping Use   Vaping Use: Never used  Substance and Sexual Activity   Alcohol use: Yes    Comment: very rarely   Drug use: No   Sexual activity: Yes    Birth control/protection: Post-menopausal  Other Topics Concern   Not on file  Social History Narrative   Married, 3 step children.  She raised her nephew who has special needs.   Educ: masters deg   Occup: retired Psychologist, prison and probation services   Rare alcohol.   No tob.   Social Determinants of Health   Financial Resource Strain: Low Risk    Difficulty of Paying Living Expenses: Not hard at all  Food Insecurity: No Food Insecurity   Worried About Programme researcher, broadcasting/film/video in the Last Year: Never true   Ran Out of Food in the Last Year: Never true  Transportation Needs: No Transportation Needs   Lack of Transportation (Medical): No   Lack of Transportation (Non-Medical): No  Physical Activity: Insufficiently Active   Days of Exercise per Week: 2 days   Minutes of Exercise per Session: 20 min  Stress: No Stress Concern Present   Feeling of Stress : Only a little  Social Connections: Press photographer of Communication with Friends and Family: More than three times a week   Frequency of Social Gatherings with Friends and Family: Once a week   Attends Religious Services: More than 4 times per year   Active Member of [employer] or Organizations: Yes   Attends Engineer, structural: More than 4 times per year   Marital Status: Married  Catering manager Violence: Not At Risk   Fear of Current or Ex-Partner: No   Emotionally Abused: No   Physically Abused: No   Sexually Abused: No     PHYSICAL EXAM  Vitals:   06/11/21 1121  BP: (!) 155/77  Pulse: 68  Weight: 266 lb  12.8 oz (121 kg)  Height: 5' 6.5" (1.689 m)   Body mass index is 42.42 kg/m.  Generalized: Well developed, in no acute distress  Cardiology: normal rate and rhythm, no murmur noted Respiratory: clear to auscultation bilaterally  Neurological  examination  Mentation: Alert oriented to time, place, history taking. Follows all commands speech and language fluent Cranial nerve II-XII: Pupils were equal round reactive to light. Extraocular movements were full, visual field were full  Motor: The motor testing reveals 5 over 5 strength of all 4 extremities. Good symmetric motor tone is noted throughout.  Gait and station: Gait is normal.    DIAGNOSTIC DATA (LABS, IMAGING, TESTING) - I reviewed patient records, labs, notes, testing and imaging myself where available.  No flowsheet data found.   Lab Results  Component Value Date   WBC 7.4 07/13/2020   HGB 14.7 07/13/2020   HCT 42.5 07/13/2020   MCV 87.2 07/13/2020   PLT 195.0 07/13/2020      Component Value Date/Time   NA 141 04/18/2021 1044   NA 140 02/16/2018 0000   K 4.0 04/18/2021 1044   CL 104 04/18/2021 1044   CO2 27 04/18/2021 1044   GLUCOSE 103 (H) 04/18/2021 1044   BUN 10 04/18/2021 1044   BUN 13 05/25/2019 0000   CREATININE 0.87 04/18/2021 1044   CALCIUM 10.4 04/18/2021 1044   PROT 6.4 04/18/2021 1044   ALBUMIN 4.4 04/18/2021 1044   AST 20 04/18/2021 1044   ALT 20 04/18/2021 1044   ALKPHOS 55 04/18/2021 1044   BILITOT 0.8 04/18/2021 1044   GFRNONAA >60 11/18/2018 1801   GFRAA >60 11/18/2018 1801   Lab Results  Component Value Date   CHOL 141 04/18/2021   HDL 58.30 04/18/2021   LDLCALC 57 04/18/2021   TRIG 126.0 04/18/2021   CHOLHDL 2 04/18/2021   Lab Results  Component Value Date   HGBA1C 5.6 10/08/2020   No results found for: VITAMINB12 Lab Results  Component Value Date   TSH 3.04 04/18/2021     ASSESSMENT AND PLAN 65 y.o. year old female  has a past medical history of Abdominal pain (2021), Carpal tunnel syndrome, Cervical spondylosis, Depression, GERD (gastroesophageal reflux disease), History of MRSA infection, Hyperlipidemia, Hypertension, IFG (impaired fasting glucose), Migraine syndrome, Obesity, Class II, BMI 35-39.9, OSA on CPAP  (11/2013), Osteoarthritis, Postsurgical hypothyroidism (12/21/2014), and Vitamin D deficiency (04/2019). here with     ICD-10-CM   1. OSA on CPAP  G47.33 For home use only DME continuous positive airway pressure (CPAP)   Z99.89         EDYTH LABBE is doing well on CPAP therapy. Compliance report reveals excellent compliance. She was encouraged to continue using CPAP nightly and for greater than 4 hours each night. I will ask DME to do some reeducation on her CPAP machine to help identify any concerns with ramping and setting adjustments. I will reprint download in 6-8 weeks to ensure AHI remains well managed and will adjust pressure settings as indicated. Risks of untreated sleep apnea review and education materials provided. Healthy lifestyle habits encouraged. She will follow up in 1 year, sooner if needed. She verbalizes understanding and agreement with this plan.    Orders Placed This Encounter  Procedures   For home use only DME continuous positive airway pressure (CPAP)    Supplies    Order Specific Question:   Length of Need    Answer:   Lifetime    Order Specific Question:  Patient has OSA or probable OSA    Answer:   Yes    Order Specific Question:   Is the patient currently using CPAP in the home    Answer:   Yes    Order Specific Question:   Settings    Answer:   Other see comments    Order Specific Question:   CPAP supplies needed    Answer:   Mask, headgear, cushions, filters, heated tubing and water chamber     No orders of the defined types were placed in this encounter.     Shawnie Dapper, FNP-C 06/11/2021, 12:26 PM [facility] [address], [address] [place], [place] [address] [phone]

## 2021-06-11 NOTE — Patient Instructions (Signed)
Please continue using your CPAP regularly. While your insurance requires that you use CPAP at least 4 hours each night on 70% of the nights, I recommend, that you not skip any nights and use it throughout the night if you can. Getting used to CPAP and staying with the treatment long term does take time and patience and discipline. Untreated obstructive sleep apnea when it is moderate to severe can have an adverse impact on cardiovascular health and raise her risk for heart disease, arrhythmias, hypertension, congestive heart failure, stroke and diabetes. Untreated obstructive sleep apnea causes sleep disruption, nonrestorative sleep, and sleep deprivation. This can have an impact on your day to day functioning and cause daytime sleepiness and impairment of cognitive function, memory loss, mood disturbance, and problems focussing. Using CPAP regularly can improve these symptoms.  We will send an order for reeducation on your machine and settings. You should hear back from [facility] in the next week.   I will repeat your download in 6-8 weeks. We may adjust pressure if needed.   Follow up with me in 1 year, sooner if needed

## 2021-07-26 ENCOUNTER — Other Ambulatory Visit: Payer: Self-pay | Admitting: Family Medicine

## 2021-07-30 ENCOUNTER — Other Ambulatory Visit: Payer: Self-pay

## 2021-07-30 ENCOUNTER — Encounter: Payer: Self-pay | Admitting: Adult Health

## 2021-07-30 ENCOUNTER — Ambulatory Visit (INDEPENDENT_AMBULATORY_CARE_PROVIDER_SITE_OTHER): Payer: No Typology Code available for payment source | Admitting: Adult Health

## 2021-07-30 VITALS — BP 133/74 | HR 76 | Ht 66.0 in | Wt 266.0 lb

## 2021-07-30 DIAGNOSIS — Z01419 Encounter for gynecological examination (general) (routine) without abnormal findings: Secondary | ICD-10-CM

## 2021-07-30 DIAGNOSIS — Z1211 Encounter for screening for malignant neoplasm of colon: Secondary | ICD-10-CM | POA: Diagnosis not present

## 2021-07-30 DIAGNOSIS — K429 Umbilical hernia without obstruction or gangrene: Secondary | ICD-10-CM

## 2021-07-30 LAB — HEMOCCULT GUIAC POC 1CARD (OFFICE): Fecal Occult Blood, POC: NEGATIVE

## 2021-07-30 NOTE — Progress Notes (Signed)
Patient ID: Theresa Young, female   DOB: Jun 22, 1956, 65 y.o.   MRN: [id number] History of Present Illness: Theresa Young is a 65 year old white female,married, PM in for a well woman gyn exam. She had a physical with PCP 10/08/20. PCP is Dr Theresa Young.   Lab Results  Component Value Date   DIAGPAP  07/25/2020    - Negative for intraepithelial lesion or malignancy (NILM)   HPV NOT DETECTED 02/16/2017   HPVHIGH Negative 07/25/2020    Current Medications, Allergies, Past Medical History, Past Surgical History, Family History and Social History were reviewed in [name] record.     Review of Systems:  Patient denies any headaches, hearing loss, fatigue, blurred vision, shortness of breath, chest pain, abdominal pain, problems with bowel movements, urination, or intercourse. No joint pain or mood swings.  Denies any vaginal bleeding  Physical Exam:BP 133/74 (BP Location: Right Arm, Patient Position: Sitting, Cuff Size: Normal)   Pulse 76   Ht 5\' 6"  (1.676 m)   Wt 266 lb (120.7 kg)   BMI 42.93 kg/m   General:  Well developed, well nourished, no acute distress Skin:  Warm and dry Neck:  Midline trachea, normal thyroid, good ROM, no lymphadenopathy,no carotid bruits heard Lungs; Clear to auscultation bilaterally Breast:  No dominant palpable mass, retraction, or nipple discharge Cardiovascular: Regular rate and rhythm Abdomen:  Soft, non tender, no hepatosplenomegaly,has small umbilical hernia Pelvic:  External genitalia is normal in appearance, no lesions.  The vagina is pale with loss of rugae. Urethra has no lesions or masses. The cervix is smooth.  Uterus is felt to be normal size, shape, and contour.  No adnexal masses or tenderness noted.Bladder is non tender, no masses felt. Rectal: Good sphincter tone, no polyps, or hemorrhoids felt.  Hemoccult negative. Extremities/musculoskeletal:  No swelling or varicosities noted, no clubbing or cyanosis Psych:  No mood changes,  alert and cooperative,seems happy AA is 1 Fall risk is low Depression screen [facility] 2/9 07/30/2021 07/25/2020 08/05/2019  Decreased Interest 0 0 0  Down, Depressed, Hopeless 0 0 0  PHQ - 2 Score 0 0 0  Altered sleeping 1 1 -  Tired, decreased energy 0 0 -  Change in appetite 0 0 -  Feeling bad or failure about yourself  0 0 -  Trouble concentrating 0 0 -  Moving slowly or fidgety/restless 0 0 -  Suicidal thoughts 0 0 -  PHQ-9 Score 1 1 -    GAD 7 : Generalized Anxiety Score 07/30/2021 07/25/2020  Nervous, Anxious, on Edge 0 0  Control/stop worrying 0 0  Worry too much - different things 0 0  Trouble relaxing 0 0  Restless 0 0  Easily annoyed or irritable 0 0  Afraid - awful might happen 0 1  Total GAD 7 Score 0 1      Upstream - 07/30/21 1443       Pregnancy Intention Screening   Does the patient want to become pregnant in the next year? No    Does the patient's partner want to become pregnant in the next year? No    Would the patient like to discuss contraceptive options today? No      Contraception Wrap Up   Current Method --   PM   End Method --   PM   Contraception Counseling Provided No            Examination chaperoned by 14/06/22 LPN  Impression and Plan:  1.  Encounter for well woman exam with routine gynecological exam Physical with PCP GYN exam in 1 year Pap 2024 Mammogram 2024 Colonoscopy per [facility] with PCP  2. Encounter for screening fecal occult blood testing   3. Umbilical hernia without obstruction and without gangrene

## 2021-08-14 ENCOUNTER — Encounter: Payer: Self-pay | Admitting: Family Medicine

## 2021-10-23 ENCOUNTER — Ambulatory Visit (INDEPENDENT_AMBULATORY_CARE_PROVIDER_SITE_OTHER): Payer: PRIVATE HEALTH INSURANCE | Admitting: Family Medicine

## 2021-10-23 ENCOUNTER — Other Ambulatory Visit: Payer: Self-pay

## 2021-10-23 ENCOUNTER — Encounter: Payer: Self-pay | Admitting: Family Medicine

## 2021-10-23 VITALS — BP 114/76 | HR 68 | Temp 98.2°F | Ht 68.0 in | Wt 267.0 lb

## 2021-10-23 DIAGNOSIS — R7301 Impaired fasting glucose: Secondary | ICD-10-CM

## 2021-10-23 DIAGNOSIS — Z23 Encounter for immunization: Secondary | ICD-10-CM | POA: Diagnosis not present

## 2021-10-23 DIAGNOSIS — E890 Postprocedural hypothyroidism: Secondary | ICD-10-CM

## 2021-10-23 DIAGNOSIS — E785 Hyperlipidemia, unspecified: Secondary | ICD-10-CM

## 2021-10-23 DIAGNOSIS — R1310 Dysphagia, unspecified: Secondary | ICD-10-CM

## 2021-10-23 DIAGNOSIS — I10 Essential (primary) hypertension: Secondary | ICD-10-CM

## 2021-10-23 DIAGNOSIS — Z0000 Encounter for general adult medical examination without abnormal findings: Secondary | ICD-10-CM

## 2021-10-23 DIAGNOSIS — L80 Vitiligo: Secondary | ICD-10-CM | POA: Insufficient documentation

## 2021-10-23 LAB — TSH: TSH: 5.21 u[IU]/mL (ref 0.35–5.50)

## 2021-10-23 LAB — CBC WITH DIFFERENTIAL/PLATELET
Basophils Absolute: 0 10*3/uL (ref 0.0–0.1)
Basophils Relative: 0.3 % (ref 0.0–3.0)
Eosinophils Absolute: 0.3 10*3/uL (ref 0.0–0.7)
Eosinophils Relative: 4.9 % (ref 0.0–5.0)
HCT: 43.1 % (ref 36.0–46.0)
Hemoglobin: 14.5 g/dL (ref 12.0–15.0)
Lymphocytes Relative: 20.3 % (ref 12.0–46.0)
Lymphs Abs: 1.3 10*3/uL (ref 0.7–4.0)
MCHC: 33.6 g/dL (ref 30.0–36.0)
MCV: 89.1 fl (ref 78.0–100.0)
Monocytes Absolute: 0.4 10*3/uL (ref 0.1–1.0)
Monocytes Relative: 6 % (ref 3.0–12.0)
Neutro Abs: 4.5 10*3/uL (ref 1.4–7.7)
Neutrophils Relative %: 68.5 % (ref 43.0–77.0)
Platelets: 179 10*3/uL (ref 150.0–400.0)
RBC: 4.83 Mil/uL (ref 3.87–5.11)
RDW: 13 % (ref 11.5–15.5)
WBC: 6.6 10*3/uL (ref 4.0–10.5)

## 2021-10-23 LAB — COMPREHENSIVE METABOLIC PANEL
ALT: 18 U/L (ref 0–35)
AST: 18 U/L (ref 0–37)
Albumin: 4.6 g/dL (ref 3.5–5.2)
Alkaline Phosphatase: 57 U/L (ref 39–117)
BUN: 12 mg/dL (ref 6–23)
CO2: 27 mEq/L (ref 19–32)
Calcium: 9.8 mg/dL (ref 8.4–10.5)
Chloride: 103 mEq/L (ref 96–112)
Creatinine, Ser: 0.93 mg/dL (ref 0.40–1.20)
GFR: 65.26 mL/min (ref >60.00)
Glucose, Bld: 110 mg/dL — ABNORMAL HIGH (ref 70–99)
Potassium: 3.8 mEq/L (ref 3.5–5.1)
Sodium: 138 mEq/L (ref 135–145)
Total Bilirubin: 1.1 mg/dL (ref 0.2–1.2)
Total Protein: 6.7 g/dL (ref 6.0–8.3)

## 2021-10-23 LAB — LIPID PANEL
Cholesterol: 159 mg/dL (ref 0–200)
HDL: 51.2 mg/dL (ref >39.00)
NonHDL: 108.21
Total CHOL/HDL Ratio: 3
Triglycerides: 282 mg/dL — ABNORMAL HIGH (ref 0.0–149.0)
VLDL: 56.4 mg/dL — ABNORMAL HIGH (ref 0.0–40.0)

## 2021-10-23 LAB — LDL CHOLESTEROL, DIRECT: Direct LDL: 68 mg/dL

## 2021-10-23 LAB — HEMOGLOBIN A1C: Hgb A1c MFr Bld: 5.8 % (ref 4.6–6.5)

## 2021-10-23 MED ORDER — LEVOTHYROXINE SODIUM 112 MCG PO TABS: ORAL_TABLET | ORAL | 3 refills | Status: DC

## 2021-10-23 NOTE — Patient Instructions (Signed)

## 2021-10-23 NOTE — Progress Notes (Signed)
Office Note 10/23/2021  CC:  Chief Complaint  Patient presents with   Annual Exam    Pt is fasting    HPI:  Patient is a 66 y.o. female who is here for annual health maintenance exam and 6 mo f/u HLD, HTN, hypothyroidism. A/P as of last visit: "1) HTN: bp a little up here today. No changes, though.  I asked her to start monitoring bp a little more at home. She is pretty apprehensive about bp med changes/additions anyway. Losartan-hctz 100-12.5, 1 qd. Lytes/cr today.   2) HLD: tolerating crestor 10mg  qd. FLP and hepatic panel today.   3) Postsurg hypothyroidism: cont 112 mcg synthroid qd. TSH today.   4) Preventative health care:  Tdap and shingrix #1 today."  INTERIM HX: Feeling great.  Home blood pressures normal. No problems taking medications.  She has noted few months history of some intermittent feeling of difficulty swallowing, almost always related to foods like rice, bread, or meat.  Occasionally feels like she needs to regurgitate.  No pain with swallowing. Her symptoms occur a few times a month at this point and she is not overly concerned but wanted to let me know. She does have a several year history of intermittent reflux but nothing severe and she takes Zantac on a as needed basis.  She is excited about taking part in a weight loss program through the [facility] health system--she is on the waiting list.  Past Medical History:  Diagnosis Date   Abdominal pain 2021   CT abd neg. suspect abd wall pain vs functional GI pain   Carpal tunnel syndrome    Cervical spondylosis    Depression    GERD (gastroesophageal reflux disease)    History of MRSA infection    R hand soft tissue   Hyperlipidemia    Hypertension    IFG (impaired fasting glucose)    A1c's 5.5-5.6% 2019-2022   Migraine syndrome    occasional   Obesity, Class II, BMI 35-39.9    OSA on CPAP 11/2013   severe; needs compliance monitoring->referred to neurologist ([facility])   Osteoarthritis     Knees, ankles, fingers, wrists, sometimes hips.  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    Past Surgical History:  Procedure Laterality Date   APPENDECTOMY  6 yrs ago   Dr. 11-15-2005   COLONOSCOPY  2012; 02/20/20   approx 2012, normal. 01/2020 polyp x 1->per Dr. 02/2020 recall 5-10 yrs   CYSTOSCOPY     for pain in side; normal   FOOT SURGERY Right 2017   dorsal tarsal exostectomy, endoscopic plantar fasciotomy, bunionectomy with screw and removal of toenail 2nd toe R foot.   GANGLION CYST EXCISION     polysomnogram  11/2013   severe OSA   THYROIDECTOMY  12/10/2011   Bx benign.  Enlarging goiter.  Procedure: THYROIDECTOMY;  Surgeon: 12/12/2011, MD;  Location: [facility];  Service: General;  Laterality: N/A;  Total Thyroidectomy   TRANSTHORACIC ECHOCARDIOGRAM  05/17/2015   EF 65-70%, mod LVH, normal wall motion, grd I DD, mild MR.    Family History  Problem Relation Age of Onset   Hypertension Mother    Cancer Mother        lung   Migraines Mother    Ulcers Mother    Hypertension Father    Migraines Father    Heart disease Father    Ulcers Father  Early death Sister    Diabetes Sister    Diabetes Paternal Grandmother    Kidney disease Paternal Grandfather        kidney failure   Heart attack Paternal Grandfather    Early death Sister    Obesity Sister    Hypertension Sister    Migraines Sister    Cancer Sister        ? cervical cancer   Multiple sclerosis Sister    Breast cancer Sister    Pseudochol deficiency Neg Hx    Malignant hyperthermia Neg Hx    Hypotension Neg Hx    Anesthesia problems Neg Hx     Social History   Socioeconomic History   Marital status: Married    Spouse name: Not on file   Number of children: Not on file   Years of education: Not on file   Highest education level: Not on file  Occupational History   Not on file  Tobacco Use   Smoking status: Never   Smokeless  tobacco: Never  Vaping Use   Vaping Use: Never used  Substance and Sexual Activity   Alcohol use: Yes    Comment: very rarely   Drug use: No   Sexual activity: Yes    Birth control/protection: Post-menopausal  Other Topics Concern   Not on file  Social History Narrative   Married, 3 step children.  She raised her nephew who has special needs.   Educ: masters deg   Occup: retired Psychologist, prison and probation services   Rare alcohol.   No tob.   Social Determinants of Health   Financial Resource Strain: Low Risk    Difficulty of Paying Living Expenses: Not hard at all  Food Insecurity: No Food Insecurity   Worried About Programme researcher, broadcasting/film/video in the Last Year: Never true   Ran Out of Food in the Last Year: Never true  Transportation Needs: No Transportation Needs   Lack of Transportation (Medical): No   Lack of Transportation (Non-Medical): No  Physical Activity: Insufficiently Active   Days of Exercise per Week: 2 days   Minutes of Exercise per Session: 20 min  Stress: No Stress Concern Present   Feeling of Stress : Only a little  Social Connections: Press photographer of Communication with Friends and Family: Three times a week   Frequency of Social Gatherings with Friends and Family: Twice a week   Attends Religious Services: More than 4 times per year   Active Member of [employer] or Organizations: Yes   Attends Engineer, structural: More than 4 times per year   Marital Status: Married  Catering manager Violence: Not At Risk   Fear of Current or Ex-Partner: No   Emotionally Abused: No   Physically Abused: No   Sexually Abused: No    Outpatient Medications Prior to Visit  Medication Sig Dispense Refill   Cholecalciferol (VITAMIN D) 50 MCG (2000 UT) CAPS Take by mouth daily.      eletriptan (RELPAX) 40 MG tablet Take 40 mg by mouth as needed. may repeat in 2 hours if necessary for migraines     famotidine (PEPCID) 20 MG tablet      levothyroxine (SYNTHROID) 112  MCG tablet TAKE (1) TABLET BY MOUTH EACH MORNING ON AN EMPTY STOMACH. 90 tablet 0   losartan-hydrochlorothiazide (HYZAAR) 100-12.5 MG tablet TAKE (1) TABLET BY MOUTH ONCE DAILY. 90 tablet 3   rosuvastatin (CRESTOR) 10 MG tablet Take 1 tablet (10 mg total) by  mouth daily. 90 tablet 3   No facility-administered medications prior to visit.    Allergies  Allergen Reactions   Bactrim [Sulfamethoxazole-Trimethoprim] Nausea And Vomiting   Erythromycin Hives    REACTION: rash on trunk of body   Iodinated Contrast Media Swelling and Other (See Comments)    Reaction: itching,swelling around the eyes    ROS Review of Systems  Constitutional:  Negative for appetite change, chills, fatigue and fever.  HENT:  Negative for congestion, dental problem, ear pain and sore throat.   Eyes:  Negative for discharge, redness and visual disturbance.  Respiratory:  Negative for cough, chest tightness, shortness of breath and wheezing.   Cardiovascular:  Negative for chest pain, palpitations and leg swelling.  Gastrointestinal:  Negative for abdominal pain, blood in stool, diarrhea, nausea and vomiting.  Genitourinary:  Negative for difficulty urinating, dysuria, flank pain, frequency, hematuria and urgency.  Musculoskeletal:  Negative for arthralgias, back pain, joint swelling, myalgias and neck stiffness.  Skin:  Negative for pallor and rash.  Neurological:  Negative for dizziness, speech difficulty, weakness and headaches.  Hematological:  Negative for adenopathy. Does not bruise/bleed easily.  Psychiatric/Behavioral:  Negative for confusion and sleep disturbance. The patient is not nervous/anxious.    PE; Vitals with BMI 10/23/2021 07/30/2021 06/11/2021  Height 5\' 8"  5\' 6"  5' 6.5"  Weight 267 lbs 266 lbs 266 lbs 13 oz  BMI 40.61 42.95 42.42  Systolic 114 133 [id number]  Diastolic 76 74 77  Pulse 68 76 68   Exam chaperoned by Emi HolesBrittanae Staton, CMA. Gen: Alert, well appearing.  Patient is oriented to person,  place, time, and situation. AFFECT: pleasant, lucid thought and speech. ENT: Ears: EACs clear, normal epithelium.  TMs with good light reflex and landmarks bilaterally.  Eyes: no injection, icteris, swelling, or exudate.  EOMI, PERRLA. Nose: no drainage or turbinate edema/swelling.  No injection or focal lesion.  Mouth: lips without lesion/swelling.  Oral mucosa pink and moist.  Dentition intact and without obvious caries or gingival swelling.  Oropharynx without erythema, exudate, or swelling.  Neck: supple/nontender.  No LAD, mass, or TM.  Carotid pulses 2+ bilaterally, without bruits. CV: RRR, no m/r/g.   LUNGS: CTA bilat, nonlabored resps, good aeration in all lung fields. ABD: soft, NT, ND, BS normal.  No hepatospenomegaly or mass.  No bruits. EXT: no clubbing, cyanosis, or edema.  Musculoskeletal: no joint swelling, erythema, warmth, or tenderness.  ROM of all joints intact. Skin - no sores or suspicious lesions or rashes or color changes  Pertinent labs:  Lab Results  Component Value Date   TSH 3.04 04/18/2021   Lab Results  Component Value Date   WBC 7.4 07/13/2020   HGB 14.7 07/13/2020   HCT 42.5 07/13/2020   MCV 87.2 07/13/2020   PLT 195.0 07/13/2020   Lab Results  Component Value Date   CREATININE 0.87 04/18/2021   BUN 10 04/18/2021   NA 141 04/18/2021   K 4.0 04/18/2021   CL 104 04/18/2021   CO2 27 04/18/2021   Lab Results  Component Value Date   ALT 20 04/18/2021   AST 20 04/18/2021   ALKPHOS 55 04/18/2021   BILITOT 0.8 04/18/2021   Lab Results  Component Value Date   CHOL 141 04/18/2021   Lab Results  Component Value Date   HDL 58.30 04/18/2021   Lab Results  Component Value Date   LDLCALC 57 04/18/2021   Lab Results  Component Value Date   TRIG 126.0 04/18/2021  Lab Results  Component Value Date   CHOLHDL 2 04/18/2021   Lab Results  Component Value Date   HGBA1C 5.6 10/08/2020   ASSESSMENT AND PLAN:   #1 hypertension, well controlled  onLosartan-hctz 100-12.5. Electrolytes and creatinine today.  #2 hyperlipidemia, well controlled on rosuvastatin 10 mg a day.  #3 postsurgical hypothyroidism. Stable long-term on 112 mcg levothyroxine daily. TSH today.  #4 obesity.  She is excited about taking part in a with intense weight loss program with [place], patient on a waiting list.  Sounds like she will get some cardiac stress test prior.  This program will involve psychiatric evaluation and will utilize nutritionist and trainers.  #5 intermittent dysphagia.  No work-up at this time. Signs/symptoms to call or return for were reviewed and pt expressed understanding.  #6 Health maintenance exam: Reviewed age and gender appropriate health maintenance issues (prudent diet, regular exercise, health risks of tobacco and excessive alcohol, use of seatbelts, fire alarms in home, use of sunscreen).  Also reviewed age and gender appropriate health screening as well as vaccine recommendations. Vaccines: Shingrix #2->today.  Flu->given today.  Otherwise UTD. Labs: fasting HP + Hba1c ordered. Cervical ca screening: per GYN MD Breast ca screening: per GYN MD Colon ca screening: recall 2026  An After Visit Summary was printed and given to the patient.  FOLLOW UP:  No follow-ups on file.  Signed:  Santiago Bumpers, MD           10/23/2021

## 2022-01-25 ENCOUNTER — Other Ambulatory Visit: Payer: Self-pay | Admitting: Family Medicine

## 2022-01-28 ENCOUNTER — Encounter (INDEPENDENT_AMBULATORY_CARE_PROVIDER_SITE_OTHER): Payer: Self-pay

## 2022-01-28 DIAGNOSIS — Z0289 Encounter for other administrative examinations: Secondary | ICD-10-CM

## 2022-03-13 ENCOUNTER — Telehealth: Payer: Self-pay | Admitting: Adult Health

## 2022-03-13 NOTE — Telephone Encounter (Signed)
Theresa Young gave me verbal permission to send copies of pap and mammogram to Dr Milinda Cave, but it is all in [facility]

## 2022-03-20 ENCOUNTER — Encounter (INDEPENDENT_AMBULATORY_CARE_PROVIDER_SITE_OTHER): Payer: Self-pay | Admitting: Bariatrics

## 2022-03-20 ENCOUNTER — Ambulatory Visit (INDEPENDENT_AMBULATORY_CARE_PROVIDER_SITE_OTHER): Payer: PRIVATE HEALTH INSURANCE | Admitting: Bariatrics

## 2022-03-20 VITALS — BP 134/80 | HR 77 | Temp 97.9°F | Ht 67.0 in | Wt 267.0 lb

## 2022-03-20 DIAGNOSIS — G4733 Obstructive sleep apnea (adult) (pediatric): Secondary | ICD-10-CM | POA: Diagnosis not present

## 2022-03-20 DIAGNOSIS — Z1331 Encounter for screening for depression: Secondary | ICD-10-CM | POA: Diagnosis not present

## 2022-03-20 DIAGNOSIS — R7301 Impaired fasting glucose: Secondary | ICD-10-CM

## 2022-03-20 DIAGNOSIS — E7849 Other hyperlipidemia: Secondary | ICD-10-CM

## 2022-03-20 DIAGNOSIS — E559 Vitamin D deficiency, unspecified: Secondary | ICD-10-CM | POA: Diagnosis not present

## 2022-03-20 DIAGNOSIS — R5383 Other fatigue: Secondary | ICD-10-CM

## 2022-03-20 DIAGNOSIS — I10 Essential (primary) hypertension: Secondary | ICD-10-CM

## 2022-03-20 DIAGNOSIS — Z0000 Encounter for general adult medical examination without abnormal findings: Secondary | ICD-10-CM

## 2022-03-20 DIAGNOSIS — Z6841 Body Mass Index (BMI) 40.0 and over, adult: Secondary | ICD-10-CM

## 2022-03-20 DIAGNOSIS — R0602 Shortness of breath: Secondary | ICD-10-CM | POA: Diagnosis not present

## 2022-03-20 DIAGNOSIS — E890 Postprocedural hypothyroidism: Secondary | ICD-10-CM | POA: Insufficient documentation

## 2022-03-20 DIAGNOSIS — Z9989 Dependence on other enabling machines and devices: Secondary | ICD-10-CM

## 2022-03-20 DIAGNOSIS — E781 Pure hyperglyceridemia: Secondary | ICD-10-CM | POA: Insufficient documentation

## 2022-03-20 DIAGNOSIS — E038 Other specified hypothyroidism: Secondary | ICD-10-CM

## 2022-03-21 LAB — LIPID PANEL WITH LDL/HDL RATIO
Cholesterol, Total: 144 mg/dL (ref 100–199)
HDL: 44 mg/dL (ref >39)
LDL Chol Calc (NIH): 64 mg/dL (ref 0–99)
LDL/HDL Ratio: 1.5 ratio (ref 0.0–3.2)
Triglycerides: 219 mg/dL — ABNORMAL HIGH (ref 0–149)
VLDL Cholesterol Cal: 36 mg/dL (ref 5–40)

## 2022-03-21 LAB — TSH+T4F+T3FREE
Free T4: 1.41 ng/dL (ref 0.82–1.77)
T3, Free: 3 pg/mL (ref 2.0–4.4)
TSH: 3.47 u[IU]/mL (ref 0.450–4.500)

## 2022-03-21 LAB — COMPREHENSIVE METABOLIC PANEL
ALT: 23 IU/L (ref 0–32)
AST: 22 IU/L (ref 0–40)
Albumin/Globulin Ratio: 2.3 — ABNORMAL HIGH (ref 1.2–2.2)
Albumin: 4.6 g/dL (ref 3.9–4.9)
Alkaline Phosphatase: 64 IU/L (ref 44–121)
BUN/Creatinine Ratio: 13 (ref 12–28)
BUN: 10 mg/dL (ref 8–27)
Bilirubin Total: 0.7 mg/dL (ref 0.0–1.2)
CO2: 22 mmol/L (ref 20–29)
Calcium: 9.6 mg/dL (ref 8.7–10.3)
Chloride: 102 mmol/L (ref 96–106)
Creatinine, Ser: 0.77 mg/dL (ref 0.57–1.00)
Globulin, Total: 2 g/dL (ref 1.5–4.5)
Glucose: 108 mg/dL — ABNORMAL HIGH (ref 70–99)
Potassium: 3.5 mmol/L (ref 3.5–5.2)
Sodium: 144 mmol/L (ref 134–144)
Total Protein: 6.6 g/dL (ref 6.0–8.5)
eGFR: 86 mL/min/{1.73_m2} (ref >59)

## 2022-03-21 LAB — INSULIN, RANDOM: INSULIN: 30.1 u[IU]/mL — ABNORMAL HIGH (ref 2.6–24.9)

## 2022-03-21 LAB — HEMOGLOBIN A1C
Est. average glucose Bld gHb Est-mCnc: 126 mg/dL
Hgb A1c MFr Bld: 6 % — ABNORMAL HIGH (ref 4.8–5.6)

## 2022-03-21 LAB — VITAMIN D 25 HYDROXY (VIT D DEFICIENCY, FRACTURES): Vit D, 25-Hydroxy: 36 ng/mL (ref 30.0–100.0)

## 2022-03-24 ENCOUNTER — Encounter (INDEPENDENT_AMBULATORY_CARE_PROVIDER_SITE_OTHER): Payer: Self-pay | Admitting: Bariatrics

## 2022-03-24 DIAGNOSIS — R7303 Prediabetes: Secondary | ICD-10-CM | POA: Insufficient documentation

## 2022-03-24 LAB — HM MAMMOGRAPHY

## 2022-04-01 NOTE — Progress Notes (Unsigned)
Chief Complaint:   OBESITY Theresa Young (MR# [id number]) is a 66 y.o. female who presents for evaluation and treatment of obesity and related comorbidities. Current BMI is Body mass index is 41.82 kg/m. Theresa Young has been struggling with her weight for many years and has been unsuccessful in either losing weight, maintaining weight loss, or reaching her healthy weight goal.  Theresa Young does like to cook but notes time as an obstacle.  She craves carbohydrates.  Theresa Young is currently in the action stage of change and ready to dedicate time achieving and maintaining a healthier weight. Theresa Young is interested in becoming our patient and working on intensive lifestyle modifications including (but not limited to) diet and exercise for weight loss.  Theresa Young's habits were reviewed today and are as follows: Her family eats meals together, she thinks her family will eat healthier with her, her desired weight loss is 107 lbs, she started gaining weight at age 3, her heaviest weight ever was 265.7 pounds, she has significant food cravings issues, she snacks frequently in the evenings, she is frequently drinking liquids with calories, she frequently makes poor food choices, and she struggles with emotional eating.  Depression Screen Theresa Young's Food and Mood (modified PHQ-9) score was 12.     03/20/2022    8:32 AM  Depression screen PHQ 2/9  Decreased Interest 1  Down, Depressed, Hopeless 2  PHQ - 2 Score 3  Altered sleeping 1  Tired, decreased energy 3  Change in appetite 2  Feeling bad or failure about yourself  0  Trouble concentrating 1  Moving slowly or fidgety/restless 2  Suicidal thoughts 0  PHQ-9 Score 12  Difficult doing work/chores Somewhat difficult   Subjective:   1. Other fatigue Theresa Young admits to daytime somnolence and admits to waking up still tired. Patient has a history of symptoms of daytime fatigue and morning fatigue. Theresa Young generally gets 7 hours of sleep per night, and states that she  has nightime awakenings. Snoring is present. Apneic episodes are present. Epworth Sleepiness Score is 14.   2. SOB (shortness of breath) on exertion Theresa Young notes increasing shortness of breath with exercising and seems to be worsening over time with weight gain. She notes getting out of breath sooner with activity than she used to. This has not gotten worse recently. Theresa Young denies shortness of breath at rest or orthopnea.  3. Impaired fasting glucose Theresa Young is not on medications currently.  4. Vitamin D deficiency Theresa Young is currently taking vitamin D.  5. OSA on CPAP Theresa Young is wearing her CPAP, but she is not resting as well.  6. Health care maintenance Obesity.  7. Other hyperlipidemia Theresa Young is currently taking rosuvastatin.  8. Essential hypertension Theresa Young is currently taking losartan-HCTZ.  9. Other specified hypothyroidism Theresa Young is currently on levothyroxine.  Assessment/Plan:   1. Other fatigue Theresa Young does feel that her weight is causing her energy to be lower than it should be. Fatigue may be related to obesity, depression or many other causes. Labs will be ordered, and in the meanwhile, Theresa Young will focus on self care including making healthy food choices, increasing physical activity and focusing on stress reduction.  - EKG 12-Lead - TSH+T4F+T3Free  2. SOB (shortness of breath) on exertion Theresa Young does feel that she gets out of breath more easily that she used to when she exercises. Theresa Young's shortness of breath appears to be obesity related and exercise induced. She has agreed to work on weight loss and gradually increase exercise to  treat her exercise induced shortness of breath. Will continue to monitor closely.  - TSH+T4F+T3Free  3. Impaired fasting glucose We will check labs today, and we will follow-up at Theresa Young's next visit.  - Hemoglobin A1c - Insulin, random - TSH+T4F+T3Free  4. Vitamin D deficiency We will check labs today, and Theresa Young will continue vitamin D.  We  will follow-up at her next visit.  - VITAMIN D 25 Hydroxy (Vit-D Deficiency, Fractures)  5. OSA on CPAP Theresa Young will continue with her CPAP nightly.  6. Health care maintenance EKG and IC were done today, and we will check labs today.  We will follow-up at Theresa Young's next visit.  7. Other hyperlipidemia We will check labs today, and Theresa Young will continue her medication as directed.  We will follow-up at her next visit.  - Comprehensive metabolic panel - Lipid Panel With LDL/HDL Ratio  8. Essential hypertension Theresa Young will continue her medications as directed, and we will follow-up on her blood pressure at her next visit.  9. Other specified hypothyroidism Theresa Young will continue levothyroxine as directed.  10. Depression screening Theresa Young had a positive depression screening. Depression is commonly associated with obesity and often results in emotional eating behaviors. We will monitor this closely and work on CBT to help improve the non-hunger eating patterns. Referral to Psychology may be required if no improvement is seen as she continues in our clinic.  11. Class 3 severe obesity with serious comorbidity and body mass index (BMI) of 40.0 to 44.9 in adult, unspecified obesity type (HCC) Theresa Young is currently in the action stage of change and her goal is to continue with weight loss efforts. I recommend Theresa Young begin the structured treatment plan as follows:  She has agreed to the Category 3 Plan.  Reviewed labs with the patient from 10/23/2021, CMP, lipids, vitamin D, CBC, glucose, and A1c.  Meal planning was discussed.  Exercise goals: No exercise has been prescribed at this time.   Behavioral modification strategies: increasing lean protein intake, decreasing simple carbohydrates, increasing vegetables, increasing water intake, decreasing eating out, no skipping meals, meal planning and cooking strategies, keeping healthy foods in the home, and planning for success.  She was informed of the  importance of frequent follow-up visits to maximize her success with intensive lifestyle modifications for her multiple health conditions. She was informed we would discuss her lab results at her next visit unless there is a critical issue that needs to be addressed sooner. Theresa Young agreed to keep her next visit at the agreed upon time to discuss these results.  Objective:   Blood pressure 134/80, pulse 77, temperature 97.9 F (36.6 C), height 5\' 7"  (1.702 m), weight 267 lb (121.1 kg), SpO2 95 %. Body mass index is 41.82 kg/m.  EKG: Normal sinus rhythm, rate 76 BPM.  Indirect Calorimeter completed today shows a VO2 of 305 and a REE of 2102.  Her calculated basal metabolic rate is 2103 thus her basal metabolic rate is better than expected.  General: Cooperative, alert, well developed, in no acute distress. HEENT: Conjunctivae and lids unremarkable. Cardiovascular: Regular rhythm.  Lungs: Normal work of breathing. Neurologic: No focal deficits.   Lab Results  Component Value Date   CREATININE 0.77 03/20/2022   BUN 10 03/20/2022   NA 144 03/20/2022   K 3.5 03/20/2022   CL 102 03/20/2022   CO2 22 03/20/2022   Lab Results  Component Value Date   ALT 23 03/20/2022   AST 22 03/20/2022   ALKPHOS 64 03/20/2022  BILITOT 0.7 03/20/2022   Lab Results  Component Value Date   HGBA1C 6.0 (H) 03/20/2022   HGBA1C 5.8 10/23/2021   HGBA1C 5.6 10/08/2020   HGBA1C 5.5 08/11/2019   HGBA1C 5.6 05/25/2019   Lab Results  Component Value Date   INSULIN 30.1 (H) 03/20/2022   Lab Results  Component Value Date   TSH 3.470 03/20/2022   Lab Results  Component Value Date   CHOL 144 03/20/2022   HDL 44 03/20/2022   LDLCALC 64 03/20/2022   LDLDIRECT 68.0 10/23/2021   TRIG 219 (H) 03/20/2022   CHOLHDL 3 10/23/2021   Lab Results  Component Value Date   WBC 6.6 10/23/2021   HGB 14.5 10/23/2021   HCT 43.1 10/23/2021   MCV 89.1 10/23/2021   PLT 179.0 10/23/2021   No results found for:  "IRON", "TIBC", "FERRITIN"  Attestation Statements:   Reviewed by clinician on day of visit: allergies, medications, problem list, medical history, surgical history, family history, social history, and previous encounter notes.  Trude Mcburney, am acting as Energy manager for [employer], DO.  I have reviewed the above documentation for accuracy and completeness, and I agree with the above. Corinna Capra, DO

## 2022-04-02 ENCOUNTER — Encounter (INDEPENDENT_AMBULATORY_CARE_PROVIDER_SITE_OTHER): Payer: Self-pay

## 2022-04-03 ENCOUNTER — Encounter (INDEPENDENT_AMBULATORY_CARE_PROVIDER_SITE_OTHER): Payer: Self-pay | Admitting: Bariatrics

## 2022-04-03 ENCOUNTER — Ambulatory Visit (INDEPENDENT_AMBULATORY_CARE_PROVIDER_SITE_OTHER): Payer: PRIVATE HEALTH INSURANCE | Admitting: Bariatrics

## 2022-04-03 VITALS — BP 119/72 | HR 78 | Temp 97.6°F | Ht 67.0 in | Wt 261.0 lb

## 2022-04-03 DIAGNOSIS — E669 Obesity, unspecified: Secondary | ICD-10-CM | POA: Diagnosis not present

## 2022-04-03 DIAGNOSIS — R7303 Prediabetes: Secondary | ICD-10-CM | POA: Diagnosis not present

## 2022-04-03 DIAGNOSIS — E781 Pure hyperglyceridemia: Secondary | ICD-10-CM

## 2022-04-03 DIAGNOSIS — E66813 Obesity, class 3: Secondary | ICD-10-CM

## 2022-04-03 DIAGNOSIS — E559 Vitamin D deficiency, unspecified: Secondary | ICD-10-CM | POA: Diagnosis not present

## 2022-04-03 DIAGNOSIS — Z6841 Body Mass Index (BMI) 40.0 and over, adult: Secondary | ICD-10-CM

## 2022-04-03 MED ORDER — VITAMIN D (ERGOCALCIFEROL) 1.25 MG (50000 UNIT) PO CAPS: 50000.0000 [IU] | ORAL_CAPSULE | ORAL | 0 refills | Status: DC

## 2022-04-13 ENCOUNTER — Encounter: Payer: Self-pay | Admitting: Family Medicine

## 2022-04-14 ENCOUNTER — Encounter (INDEPENDENT_AMBULATORY_CARE_PROVIDER_SITE_OTHER): Payer: Self-pay | Admitting: Bariatrics

## 2022-04-14 NOTE — Progress Notes (Unsigned)
Chief Complaint:   OBESITY Theresa Young is here to discuss her progress with her obesity treatment plan along with follow-up of her obesity related diagnoses. Theresa Young is on the Category 3 Plan and states she is following her eating plan approximately 90% of the time. Theresa Young states she is doing 0 minutes 0 times per week.  Today's visit was #: 2 Starting weight: 267 lbs Starting date: 03/20/2022 Today's weight: 261 lbs Today's date: 04/03/2022 Total lbs lost to date: 6 Total lbs lost since last in-office visit: 6  Interim History: Theresa Young is down 6 pounds since her first visit.  She denies any specific struggles.  She has not been hungry.  She liked the snack options.  Subjective:   1. Hypertriglyceridemia Theresa Young takes Crestor, and her other lipids were within range.   2. Prediabetes Theresa Young insulin level is 30.1 and A1c 6.0, previously 5.8.  3. Vitamin D deficiency Theresa Young Vitamin D level is 23.52.  Assessment/Plan:   1. Hypertriglyceridemia Theresa Young will continue working on cutting her carbohydrates (starches and sweets), and increase exercise.   2. Prediabetes Theresa Young will continue to work on her weight loss and decreasing carbohydrates.  Handouts on insulin resistance and prediabetes were given to the patient today.  3. Vitamin D deficiency Theresa Young agreed to start prescription Vitamin D 50,000 IU once weekly, with no refills.   - Vitamin D, Ergocalciferol, (DRISDOL) 1.25 MG (50000 UNIT) CAPS capsule; Take 1 capsule (50,000 Units total) by mouth every 7 (seven) days.  Dispense: 5 capsule; Refill: 0  4. Obesity, Current BMI 41.0 Theresa Young is currently in the action stage of change. As such, her goal is to continue with weight loss efforts. She has agreed to the Category 3 Plan.   Increase water intake.  She will continue to make good snacking choices.  Reviewed labs with the patient from 03/20/2022, CMP, lipids, A1c, insulin, and thyroid.  Exercise goals: No exercise has been prescribed at  this time.  Behavioral modification strategies: increasing lean protein intake, decreasing simple carbohydrates, increasing vegetables, increasing water intake, decreasing eating out, no skipping meals, meal planning and cooking strategies, keeping healthy foods in the home, and planning for success.  Theresa Young has agreed to follow-up with our clinic in 2 to 3 weeks. She was informed of the importance of frequent follow-up visits to maximize her success with intensive lifestyle modifications for her multiple health conditions.   Objective:   Blood pressure 119/72, pulse 78, temperature 97.6 F (36.4 C), height 5\' 7"  (1.702 m), weight 261 lb (118.4 kg), SpO2 97 %. Body mass index is 40.88 kg/m.  General: Cooperative, alert, well developed, in no acute distress. HEENT: Conjunctivae and lids unremarkable. Cardiovascular: Regular rhythm.  Lungs: Normal work of breathing. Neurologic: No focal deficits.   Lab Results  Component Value Date   CREATININE 0.77 03/20/2022   BUN 10 03/20/2022   NA 144 03/20/2022   K 3.5 03/20/2022   CL 102 03/20/2022   CO2 22 03/20/2022   Lab Results  Component Value Date   ALT 23 03/20/2022   AST 22 03/20/2022   ALKPHOS 64 03/20/2022   BILITOT 0.7 03/20/2022   Lab Results  Component Value Date   HGBA1C 6.0 (H) 03/20/2022   HGBA1C 5.8 10/23/2021   HGBA1C 5.6 10/08/2020   HGBA1C 5.5 08/11/2019   HGBA1C 5.6 05/25/2019   Lab Results  Component Value Date   INSULIN 30.1 (H) 03/20/2022   Lab Results  Component Value Date   TSH 3.470 03/20/2022  Lab Results  Component Value Date   CHOL 144 03/20/2022   HDL 44 03/20/2022   LDLCALC 64 03/20/2022   LDLDIRECT 68.0 10/23/2021   TRIG 219 (H) 03/20/2022   CHOLHDL 3 10/23/2021   Lab Results  Component Value Date   VD25OH 36.0 03/20/2022   VD25OH 23.62 (L) 04/06/2020   Lab Results  Component Value Date   WBC 6.6 10/23/2021   HGB 14.5 10/23/2021   HCT 43.1 10/23/2021   MCV 89.1 10/23/2021    PLT 179.0 10/23/2021   No results found for: "IRON", "TIBC", "FERRITIN"  Attestation Statements:   Reviewed by clinician on day of visit: allergies, medications, problem list, medical history, surgical history, family history, social history, and previous encounter notes.   Trude Mcburney, am acting as Energy manager for [employer], DO.  I have reviewed the above documentation for accuracy and completeness, and I agree with the above. Corinna Capra, DO

## 2022-04-16 ENCOUNTER — Encounter (INDEPENDENT_AMBULATORY_CARE_PROVIDER_SITE_OTHER): Payer: Self-pay | Admitting: Bariatrics

## 2022-04-25 ENCOUNTER — Encounter: Payer: Self-pay | Admitting: Family Medicine

## 2022-04-25 ENCOUNTER — Ambulatory Visit (INDEPENDENT_AMBULATORY_CARE_PROVIDER_SITE_OTHER): Payer: PRIVATE HEALTH INSURANCE | Admitting: Family Medicine

## 2022-04-25 VITALS — BP 101/64 | HR 66 | Temp 98.4°F | Ht 67.0 in | Wt 257.4 lb

## 2022-04-25 DIAGNOSIS — I10 Essential (primary) hypertension: Secondary | ICD-10-CM

## 2022-04-25 DIAGNOSIS — E7800 Pure hypercholesterolemia, unspecified: Secondary | ICD-10-CM | POA: Diagnosis not present

## 2022-04-25 DIAGNOSIS — E559 Vitamin D deficiency, unspecified: Secondary | ICD-10-CM

## 2022-04-25 DIAGNOSIS — E890 Postprocedural hypothyroidism: Secondary | ICD-10-CM

## 2022-04-25 DIAGNOSIS — Z23 Encounter for immunization: Secondary | ICD-10-CM | POA: Diagnosis not present

## 2022-04-25 DIAGNOSIS — R7303 Prediabetes: Secondary | ICD-10-CM | POA: Diagnosis not present

## 2022-04-25 MED ORDER — LOSARTAN POTASSIUM-HCTZ 100-12.5 MG PO TABS: ORAL_TABLET | ORAL | 3 refills | Status: DC

## 2022-04-25 MED ORDER — ROSUVASTATIN CALCIUM 10 MG PO TABS: 10.0000 mg | ORAL_TABLET | Freq: Every day | ORAL | 3 refills | Status: DC

## 2022-04-25 NOTE — Progress Notes (Signed)
OFFICE VISIT  04/25/2022  CC:  Chief Complaint  Patient presents with   Hypertension   Hyperlipidemia   Hypothyroidism   Patient is a 66 y.o. female who presents for 6 month follow-up hypertension, hyperlipidemia, and postsurgical hypothyroidism. A/P as of last visit: "1 hypertension, well controlled onLosartan-hctz 100-12.5. Electrolytes and creatinine today.   #2 hyperlipidemia, well controlled on rosuvastatin 10 mg a day.   #3 postsurgical hypothyroidism. Stable long-term on 112 mcg levothyroxine daily. TSH today.   #4 obesity.  She is excited about taking part in a with intense weight loss program with [facility] [facility], patient on a waiting list.  Sounds like she will get some cardiac stress test prior.  This program will involve psychiatric evaluation and will utilize nutritionist and trainers.   #5 intermittent dysphagia.  No work-up at this time. Signs/symptoms to call or return for were reviewed and pt expressed understanding.   #6 Health maintenance exam: Reviewed age and gender appropriate health maintenance issues (prudent diet, regular exercise, health risks of tobacco and excessive alcohol, use of seatbelts, fire alarms in home, use of sunscreen).  Also reviewed age and gender appropriate health screening as well as vaccine recommendations. Vaccines: Shingrix #2->today.  Flu->given today.  Otherwise UTD. Labs: fasting HP + Hba1c ordered. Cervical ca screening: per GYN MD Breast ca screening: per GYN MD Colon ca screening: recall 2026"  INTERIM HX: Shamir feels very well. She is now seeing the providers at the healthy weight and wellness clinic. Labs were most recently done by that office 03/20/2022.  Fasting glucose was 108, LDL 64, triglycerides 218, hemoglobin A1c 6%, TSH 3.47, Vit D 36.  He is very happy with her diet and her weight loss so far. She is doing some exercise but this is somewhat limited by her arthritis.  Her vitamin D level was increased to 50,000 unit  once a week.  She has had several doses of this but it is not time for recheck yet.  She monitors blood pressure some at home and it is always well within normal limits.  ROS as above, plus--> no fevers, no CP, no SOB, no wheezing, no cough, no dizziness, no HAs, no rashes, no melena/hematochezia.  No polyuria or polydipsia.  No myalgias or arthralgias.  No focal weakness, paresthesias, or tremors.  No acute vision or hearing abnormalities.  No dysuria or unusual/new urinary urgency or frequency.  No recent changes in lower legs. No n/v/d or abd pain.  No palpitations.     Past Medical History:  Diagnosis Date   Abdominal pain 2021   CT abd neg. suspect abd wall pain vs functional GI pain   Carpal tunnel syndrome    Cervical spondylosis    Depression    GERD (gastroesophageal reflux disease)    History of MRSA infection    R hand soft tissue   Hyperlipidemia    Hypertension    Hypothyroidism    IFG (impaired fasting glucose)    A1c's 5.5-5.6% 2019-2022   Joint pain    Migraine syndrome    occasional   Obesity, Class II, BMI 35-39.9    OSA on CPAP 11/2013   severe; needs compliance monitoring->referred to neurologist ([facility])   Osteoarthritis    Knees, ankles, fingers, wrists, sometimes hips.  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    Past Surgical History:  Procedure Laterality Date   APPENDECTOMY  6 yrs ago   Dr. Arnoldo Morale   COLONOSCOPY  2012; 02/20/20   approx 2012, normal. 01/2020 polyp x 1->per Dr. Collene Mares recall 5-10 yrs   CYSTOSCOPY     for pain in side; normal   FOOT SURGERY Right 2017   dorsal tarsal exostectomy, endoscopic plantar fasciotomy, bunionectomy with screw and removal of toenail 2nd toe R foot.   GANGLION CYST EXCISION     polysomnogram  11/2013   severe OSA   THYROIDECTOMY  12/10/2011   Bx benign.  Enlarging goiter.  Procedure: THYROIDECTOMY;  Surgeon: Jamesetta So,  MD;  Location: [facility];  Service: General;  Laterality: N/A;  Total Thyroidectomy   TRANSTHORACIC ECHOCARDIOGRAM  05/17/2015   EF 65-70%, mod LVH, normal wall motion, grd I DD, mild MR.    Outpatient Medications Prior to Visit  Medication Sig Dispense Refill   acetaminophen (TYLENOL 8 HOUR) 650 MG CR tablet Take 650 mg by mouth every 8 (eight) hours as needed for pain.     eletriptan (RELPAX) 40 MG tablet Take 40 mg by mouth as needed. may repeat in 2 hours if necessary for migraines     famotidine (PEPCID) 20 MG tablet      levothyroxine (SYNTHROID) 112 MCG tablet TAKE (1) TABLET BY MOUTH EACH MORNING ON AN EMPTY STOMACH. 90 tablet 3   losartan-hydrochlorothiazide (HYZAAR) 100-12.5 MG tablet TAKE (1) TABLET BY MOUTH ONCE DAILY. 90 tablet 3   rosuvastatin (CRESTOR) 10 MG tablet TAKE ONE TABLET BY MOUTH ONCE DAILY. 90 tablet 0   Vitamin D, Ergocalciferol, (DRISDOL) 1.25 MG (50000 UNIT) CAPS capsule Take 1 capsule (50,000 Units total) by mouth every 7 (seven) days. 5 capsule 0   Cholecalciferol (VITAMIN D) 50 MCG (2000 UT) CAPS Take by mouth daily.  (Patient not taking: Reported on 04/25/2022)     No facility-administered medications prior to visit.    Allergies  Allergen Reactions   Bactrim [Sulfamethoxazole-Trimethoprim] Nausea And Vomiting   Erythromycin Hives    REACTION: rash on trunk of body   Iodinated Contrast Media Swelling and Other (See Comments)    Reaction: itching,swelling around the eyes    ROS As per HPI  PE:    04/25/2022    8:56 AM 04/03/2022    1:00 PM 03/20/2022    8:00 AM  Vitals with BMI  Height $Remov'5\' 7"'vAGZbL$  $Remove'5\' 7"'DsqljpU$  $RemoveB'5\' 7"'CetgTZyt$   Weight 257 lbs 6 oz 261 lbs 267 lbs  BMI 40.31 16.10 96.04  Systolic 540 981 191  Diastolic 64 72 80  Pulse 66 78 77     Physical Exam  Gen: Alert, well appearing.  Patient is oriented to person, place, time, and situation. AFFECT: pleasant, lucid thought and speech. CV: RRR, no m/r/g.   LUNGS: CTA bilat, nonlabored resps, good aeration in  all lung fields. EXT: no clubbing or cyanosis.  no edema.    LABS:  Last CBC Lab Results  Component Value Date   WBC 6.6 10/23/2021   HGB 14.5 10/23/2021   HCT 43.1 10/23/2021   MCV 89.1 10/23/2021   MCH 30.2 11/18/2018   RDW 13.0 10/23/2021   PLT 179.0 47/82/9562   Last metabolic panel Lab Results  Component Value Date   GLUCOSE 108 (H) 03/20/2022   NA 144 03/20/2022   K 3.5 03/20/2022   CL 102 03/20/2022   CO2 22 03/20/2022   BUN 10 03/20/2022   CREATININE 0.77 03/20/2022   EGFR 86 03/20/2022   CALCIUM 9.6 03/20/2022   PHOS 3.9  12/11/2011   PROT 6.6 03/20/2022   ALBUMIN 4.6 03/20/2022   LABGLOB 2.0 03/20/2022   AGRATIO 2.3 (H) 03/20/2022   BILITOT 0.7 03/20/2022   ALKPHOS 64 03/20/2022   AST 22 03/20/2022   ALT 23 03/20/2022   ANIONGAP 10 11/18/2018   Last lipids Lab Results  Component Value Date   CHOL 144 03/20/2022   HDL 44 03/20/2022   LDLCALC 64 03/20/2022   LDLDIRECT 68.0 10/23/2021   TRIG 219 (H) 03/20/2022   CHOLHDL 3 10/23/2021   Last hemoglobin A1c Lab Results  Component Value Date   HGBA1C 6.0 (H) 03/20/2022   Last thyroid functions Lab Results  Component Value Date   TSH 3.470 03/20/2022   Last vitamin D Lab Results  Component Value Date   VD25OH 36.0 03/20/2022   IMPRESSION AND PLAN:  #1 hypertension, well controlled on losartan-HCTZ 100-12 0.5, 1 daily. She will monitor blood pressure and if it continues to be in the low normal range she will try cutting her tab in half and monitoring.  2.  Hyperlipidemia, recent LDL was 64 less than 2 months ago. Continue rosuvastatin 10 mg daily.  #3 postsurgical hypothyroidism. TSH about 5 weeks ago was 3.5. Continue levothyroxine 112 mcg daily.  #4 obesity and prediabetes. She is doing great working on weight loss at the direction of the healthy weight and wellness clinic. Most recent A1c about 5 weeks ago was 6%.  5 vitamin D deficiency. Recently started 50,000 units  weekly. Suspect this will be followed up with a recheck by her healthy weight and wellness provider.  No labs needed today  An After Visit Summary was printed and given to the patient.  FOLLOW UP: No follow-ups on file.  Signed:  Crissie Sickles, MD           04/25/2022

## 2022-05-06 ENCOUNTER — Encounter: Payer: Self-pay | Admitting: Bariatrics

## 2022-05-06 ENCOUNTER — Ambulatory Visit: Payer: PRIVATE HEALTH INSURANCE | Admitting: Bariatrics

## 2022-05-06 VITALS — BP 150/78 | HR 62 | Temp 97.8°F | Ht 67.0 in | Wt 253.0 lb

## 2022-05-06 DIAGNOSIS — E559 Vitamin D deficiency, unspecified: Secondary | ICD-10-CM

## 2022-05-06 DIAGNOSIS — Z6839 Body mass index (BMI) 39.0-39.9, adult: Secondary | ICD-10-CM | POA: Diagnosis not present

## 2022-05-06 DIAGNOSIS — I10 Essential (primary) hypertension: Secondary | ICD-10-CM

## 2022-05-06 DIAGNOSIS — Z6841 Body Mass Index (BMI) 40.0 and over, adult: Secondary | ICD-10-CM

## 2022-05-06 DIAGNOSIS — E669 Obesity, unspecified: Secondary | ICD-10-CM

## 2022-05-06 MED ORDER — VITAMIN D (ERGOCALCIFEROL) 1.25 MG (50000 UNIT) PO CAPS: 50000.0000 [IU] | ORAL_CAPSULE | ORAL | 0 refills | Status: DC

## 2022-05-06 NOTE — Progress Notes (Deleted)
Chief Complaint:   OBESITY Theresa Young is here to discuss her progress with her obesity treatment plan along with follow-up of her obesity related diagnoses.   Today's visit was #: 3 Starting weight: 267 lb Starting date: 03/20/2022 Today's weight:  Last Weight  Most recent update: 05/06/2022 12:40 PM    Weight  114.8 kg (253 lb)             Today's date: 05/06/2022 Weight change since last visit: 8 lbs  Total lbs lost to date: 14 lbs Body mass index is 39.63 kg/m.  Total weight loss percentage to date: ***  Current Meal Plan: the Category 3 Plan for 85-90% of the time.  Current Exercise Plan: treadmill for 12 minutes 2 times per week. Current Anti-Obesity Medications: ***. Side effects: ***.  Interim History:***  Assessment/Plan:   1. Vitamin D deficiency *** - Vitamin D, Ergocalciferol, (DRISDOL) 1.25 MG (50000 UNIT) CAPS capsule; Take 1 capsule (50,000 Units total) by mouth every 7 (seven) days.  Dispense: 5 capsule; Refill: 0  2. Essential hypertension ***  Course: Theresa Young is currently in the action stage of change. As such, her goal is to continue with weight loss efforts.   Nutrition goals: She has agreed to {MWMwtlossportion/plan2:23431}.   Exercise goals: {MWM EXERCISE RECS:23473}  Behavioral modification strategies: {MWMwtlossdietstrategies3:23432}.  Theresa Young has agreed to follow-up with our clinic in {NUMBER 1-10:22536} weeks. She was informed of the importance of frequent follow-up visits to maximize her success with intensive lifestyle modifications for her multiple health conditions.   ***delete paragraph if no labs orderedTerri was informed we would discuss her lab results at her next visit unless there is a critical issue that needs to be addressed sooner. Theresa Young agreed to keep her next visit at the agreed upon time to discuss these results.  Objective:   Vitals:   05/06/22 1200  BP: (!) 150/78   Pulse: 62  Temp: 97.8 F (36.6 C)  Height: 5\' 7"  (1.702 m)  Weight: 253 lb (114.8 kg)  SpO2: 98%  BMI (Calculated): 39.62     General: Cooperative, alert, well developed, in no acute distress. HEENT: Conjunctivae and lids unremarkable. Cardiovascular: Regular rhythm.  Lungs: Normal work of breathing. Neurologic: No focal deficits.   Lab Results  Component Value Date   CREATININE 0.77 03/20/2022   BUN 10 03/20/2022   NA 144 03/20/2022   K 3.5 03/20/2022   CL 102 03/20/2022   CO2 22 03/20/2022   Lab Results  Component Value Date   ALT 23 03/20/2022   AST 22 03/20/2022   ALKPHOS 64 03/20/2022   BILITOT 0.7 03/20/2022   Lab Results  Component Value Date   HGBA1C 6.0 (H) 03/20/2022   HGBA1C 5.8 10/23/2021   HGBA1C 5.6 10/08/2020   HGBA1C 5.5 08/11/2019   HGBA1C 5.6 05/25/2019   Lab Results  Component Value Date   INSULIN 30.1 (H) 03/20/2022   Lab Results  Component Value Date   TSH 3.470 03/20/2022   Lab Results  Component Value Date   CHOL 144 03/20/2022   HDL 44 03/20/2022   LDLCALC 64 03/20/2022   LDLDIRECT 68.0 10/23/2021   TRIG  219 (H) 03/20/2022   CHOLHDL 3 10/23/2021   Lab Results  Component Value Date   VD25OH 36.0 03/20/2022   VD25OH 23.62 (L) 04/06/2020   Lab Results  Component Value Date   WBC 6.6 10/23/2021   HGB 14.5 10/23/2021   HCT 43.1 10/23/2021   MCV 89.1 10/23/2021   PLT 179.0 10/23/2021   No results found for: "IRON", "TIBC", "FERRITIN"  Obesity Behavioral Intervention:   Approximately 15 minutes were spent on the discussion below.  ASK: We discussed the diagnosis of obesity with Theresa Young today and Theresa Young agreed to give [place] permission to discuss obesity behavioral modification therapy today.  ASSESS: Theresa Young has the diagnosis of obesity and her BMI today is ***. Theresa Young {ACTION; IS/IS XKG:[id number]} in the action stage of change.   ADVISE: Theresa Young was educated on the multiple health risks of obesity as well as the benefit of  weight loss to improve her health. She was advised of the need for long term treatment and the importance of lifestyle modifications to improve her current health and to decrease her risk of future health problems.  AGREE: Multiple dietary modification options and treatment options were discussed and Theresa Young agreed to follow the recommendations documented in the above note.  ARRANGE: Theresa Young was educated on the importance of frequent visits to treat obesity as outlined per CMS and USPSTF guidelines and agreed to schedule her next follow up appointment today.  Attestation Statements:   Reviewed by clinician on day of visit: allergies, medications, problem list, medical history, surgical history, family history, social history, and previous encounter notes.  ***(delete if time-based billing not used)Time spent on visit including pre-visit chart review and post-visit care and charting was *** minutes.   I, ***, am acting as transcriptionist for ***.  I have reviewed the above documentation for accuracy and completeness, and I agree with the above. -  ***

## 2022-05-12 ENCOUNTER — Other Ambulatory Visit: Payer: Self-pay | Admitting: Bariatrics

## 2022-05-12 DIAGNOSIS — E559 Vitamin D deficiency, unspecified: Secondary | ICD-10-CM

## 2022-05-13 ENCOUNTER — Telehealth (INDEPENDENT_AMBULATORY_CARE_PROVIDER_SITE_OTHER): Payer: Self-pay | Admitting: Bariatrics

## 2022-05-13 NOTE — Telephone Encounter (Signed)
Spoke to patient and confirmed with pharmacy that prescription was sent 05/06/22. Patient verbalized understanding.

## 2022-05-13 NOTE — Progress Notes (Unsigned)
Chief Complaint:   OBESITY Theresa Young is here to discuss her progress with her obesity treatment plan along with follow-up of her obesity related diagnoses. Theresa Young is on the Category 3 Plan and states she is following her eating plan approximately 85-90% of the time. Theresa Young states she is on the treadmill for 12 minutes 2 times per week.  Today's visit was #: 3 Starting weight: 267 lbs Starting date: 03/20/2022 Today's weight: 253 lbs Today's date: 05/06/2022 Total lbs lost to date: 14 Total lbs lost since last in-office visit: 8  Interim History: Theresa Young is down an additional 8 lbs and she is doing well overall. She is making good choices. She has been on vacation and she enjoyed herself. She has been more acting and doing work at home.   Subjective:   1. Essential hypertension Danielys's blood pressure is slightly elevated today.her PCP recently decreased her medication by 1/2. Her blood pressure at home range at 120's/50's.   2. Vitamin D deficiency Theresa Young is taking Vitamin D prescription.  Assessment/Plan:   1. Essential hypertension Birdella will continue her medications, and will continue to monitor her blood pressure at home.   2. Vitamin D deficiency We will refill prescription Vitamin D for 1 month. Theresa Young will follow-up for routine testing of Vitamin D, at least 2-3 times per year to avoid over-replacement.  - Vitamin D, Ergocalciferol, (DRISDOL) 1.25 MG (50000 UNIT) CAPS capsule; Take 1 capsule (50,000 Units total) by mouth every 7 (seven) days.  Dispense: 5 capsule; Refill: 0  3. Obesity, Current BMI 39.7 Theresa Young is currently in the action stage of change. As such, her goal is to continue with weight loss efforts. She has agreed to the Category 3 Plan.   Meal planning and intentional eating were discussed. She will keep her protein and water intake high.  Exercise goals: As is, and add 3 lb weights.   Behavioral modification strategies: increasing lean protein intake, decreasing  simple carbohydrates, increasing vegetables, increasing water intake, decreasing eating out, no skipping meals, meal planning and cooking strategies, keeping healthy foods in the home, and planning for success.  Theresa Young has agreed to follow-up with our clinic in 4 weeks. She was informed of the importance of frequent follow-up visits to maximize her success with intensive lifestyle modifications for her multiple health conditions.   Objective:   Blood pressure (!) 150/78, pulse 62, temperature 97.8 F (36.6 C), height 5\' 7"  (1.702 m), weight 253 lb (114.8 kg), SpO2 98 %. Body mass index is 39.63 kg/m.  General: Cooperative, alert, well developed, in no acute distress. HEENT: Conjunctivae and lids unremarkable. Cardiovascular: Regular rhythm.  Lungs: Normal work of breathing. Neurologic: No focal deficits.   Lab Results  Component Value Date   CREATININE 0.77 03/20/2022   BUN 10 03/20/2022   NA 144 03/20/2022   K 3.5 03/20/2022   CL 102 03/20/2022   CO2 22 03/20/2022   Lab Results  Component Value Date   ALT 23 03/20/2022   AST 22 03/20/2022   ALKPHOS 64 03/20/2022   BILITOT 0.7 03/20/2022   Lab Results  Component Value Date   HGBA1C 6.0 (H) 03/20/2022   HGBA1C 5.8 10/23/2021   HGBA1C 5.6 10/08/2020   HGBA1C 5.5 08/11/2019   HGBA1C 5.6 05/25/2019   Lab Results  Component Value Date   INSULIN 30.1 (H) 03/20/2022   Lab Results  Component Value Date   TSH 3.470 03/20/2022   Lab Results  Component Value Date   CHOL  144 03/20/2022   HDL 44 03/20/2022   LDLCALC 64 03/20/2022   LDLDIRECT 68.0 10/23/2021   TRIG 219 (H) 03/20/2022   CHOLHDL 3 10/23/2021   Lab Results  Component Value Date   VD25OH 36.0 03/20/2022   VD25OH 23.62 (L) 04/06/2020   Lab Results  Component Value Date   WBC 6.6 10/23/2021   HGB 14.5 10/23/2021   HCT 43.1 10/23/2021   MCV 89.1 10/23/2021   PLT 179.0 10/23/2021   No results found for: "IRON", "TIBC", "FERRITIN"  Attestation  Statements:   Reviewed by clinician on day of visit: allergies, medications, problem list, medical history, surgical history, family history, social history, and previous encounter notes.   Wilhemena Durie, am acting as Location manager for [employer], DO.  I have reviewed the above documentation for accuracy and completeness, and I agree with the above. Jearld Lesch, DO

## 2022-05-13 NOTE — Telephone Encounter (Signed)
09/19 Patient requested a refill for Vitamin D through [facility] and the request was denied. Patient wanted to know why and if someone could give her a call. Theresa Young

## 2022-05-14 ENCOUNTER — Encounter: Payer: Self-pay | Admitting: Bariatrics

## 2022-05-28 ENCOUNTER — Ambulatory Visit: Payer: PRIVATE HEALTH INSURANCE | Admitting: Bariatrics

## 2022-05-28 ENCOUNTER — Encounter: Payer: Self-pay | Admitting: Bariatrics

## 2022-05-28 VITALS — BP 116/62 | HR 72 | Temp 97.9°F | Ht 67.0 in | Wt 251.0 lb

## 2022-05-28 DIAGNOSIS — E559 Vitamin D deficiency, unspecified: Secondary | ICD-10-CM | POA: Diagnosis not present

## 2022-05-28 DIAGNOSIS — R7303 Prediabetes: Secondary | ICD-10-CM

## 2022-05-28 DIAGNOSIS — Z6839 Body mass index (BMI) 39.0-39.9, adult: Secondary | ICD-10-CM | POA: Diagnosis not present

## 2022-05-28 DIAGNOSIS — E669 Obesity, unspecified: Secondary | ICD-10-CM | POA: Diagnosis not present

## 2022-05-28 MED ORDER — VITAMIN D (ERGOCALCIFEROL) 1.25 MG (50000 UNIT) PO CAPS: 50000.0000 [IU] | ORAL_CAPSULE | ORAL | 0 refills | Status: DC

## 2022-06-03 ENCOUNTER — Encounter: Payer: Self-pay | Admitting: Bariatrics

## 2022-06-03 NOTE — Progress Notes (Signed)
Chief Complaint:   OBESITY Theresa Young is here to discuss her progress with her obesity treatment plan along with follow-up of her obesity related diagnoses. Theresa Young is on the Category 3 Plan and states she is following her eating plan approximately 85% of the time. Theresa Young states she is walking and lifting weights for 15-20 minutes 3 times per week.  Today's visit was #: 4 Starting weight: 267 lbs Starting date: 03/20/2022 Today's weight: 251 lbs Today's date: 05/28/2022 Total lbs lost to date: 16 Total lbs lost since last in-office visit: 2  Interim History: Theresa Young is down 2 pounds since her last visit.  She is exercising more, but she has been eating out more.  Subjective:   1. Vitamin D deficiency Theresa Young is taking vitamin D as directed, and she notes less fatigue.  2. Prediabetes Theresa Young is not on medications currently.   Assessment/Plan:   1. Vitamin D deficiency Theresa Young will continue prescription Vitamin D 50,000 IU once weekly, and we will refill for 1 month.   - Vitamin D, Ergocalciferol, (DRISDOL) 1.25 MG (50000 UNIT) CAPS capsule; Take 1 capsule (50,000 Units total) by mouth every 7 (seven) days.  Dispense: 5 capsule; Refill: 0  2. Prediabetes Theresa Young will continue to decrease all carbohydrates (sweets and starches).   3. Obesity, Current BMI 39.3 Theresa Young is currently in the action stage of change. As such, her goal is to continue with weight loss efforts. She has agreed to the Category 3 Plan.   She will continue to adhere closely to the plan 80-90%.  Mindful eating was discussed.  Eating out handout was given.  Exercise goals: Increase walking and weights.  Behavioral modification strategies: increasing lean protein intake, decreasing simple carbohydrates, increasing vegetables, increasing water intake, decreasing eating out, no skipping meals, meal planning and cooking strategies, keeping healthy foods in the home, and planning for success.  Theresa Young has agreed to follow-up with  our clinic in 2 to 3 weeks. She was informed of the importance of frequent follow-up visits to maximize her success with intensive lifestyle modifications for her multiple health conditions.   Objective:   Blood pressure 116/62, pulse 72, temperature 97.9 F (36.6 C), height 5\' 7"  (1.702 m), weight 251 lb (113.9 kg), SpO2 97 %. Body mass index is 39.31 kg/m.  General: Cooperative, alert, well developed, in no acute distress. HEENT: Conjunctivae and lids unremarkable. Cardiovascular: Regular rhythm.  Lungs: Normal work of breathing. Neurologic: No focal deficits.   Lab Results  Component Value Date   CREATININE 0.77 03/20/2022   BUN 10 03/20/2022   NA 144 03/20/2022   K 3.5 03/20/2022   CL 102 03/20/2022   CO2 22 03/20/2022   Lab Results  Component Value Date   ALT 23 03/20/2022   AST 22 03/20/2022   ALKPHOS 64 03/20/2022   BILITOT 0.7 03/20/2022   Lab Results  Component Value Date   HGBA1C 6.0 (H) 03/20/2022   HGBA1C 5.8 10/23/2021   HGBA1C 5.6 10/08/2020   HGBA1C 5.5 08/11/2019   HGBA1C 5.6 05/25/2019   Lab Results  Component Value Date   INSULIN 30.1 (H) 03/20/2022   Lab Results  Component Value Date   TSH 3.470 03/20/2022   Lab Results  Component Value Date   CHOL 144 03/20/2022   HDL 44 03/20/2022   LDLCALC 64 03/20/2022   LDLDIRECT 68.0 10/23/2021   TRIG 219 (H) 03/20/2022   CHOLHDL 3 10/23/2021   Lab Results  Component Value Date   VD25OH 36.0  03/20/2022   VD25OH 23.62 (L) 04/06/2020   Lab Results  Component Value Date   WBC 6.6 10/23/2021   HGB 14.5 10/23/2021   HCT 43.1 10/23/2021   MCV 89.1 10/23/2021   PLT 179.0 10/23/2021   No results found for: "IRON", "TIBC", "FERRITIN"  Attestation Statements:   Reviewed by clinician on day of visit: allergies, medications, problem list, medical history, surgical history, family history, social history, and previous encounter notes.   Wilhemena Durie, am acting as Location manager for [employer], DO.  I have reviewed the above documentation for accuracy and completeness, and I agree with the above. Theresa Lesch, DO

## 2022-06-10 NOTE — Progress Notes (Unsigned)
PATIENT: Theresa Young DOB: 1956/05/21  REASON FOR VISIT: follow up HISTORY FROM: patient  No chief complaint on file.    HISTORY OF PRESENT ILLNESS:  06/10/22 ALL:  Theresa Young returns for follow up for OSA on CPAP.   06/11/2021 ALL: Theresa Young is a 66 y.o. female here today for follow up for OSA on CPAP.  Theresa Young was previously on CPAP and needed new machine. HST 09/17/2020 confirmed severe OSA with total AHI of 72.3/hr and O2 nadir of 82%. AutoPAP ordered. Theresa Young is doing fairly well on her new machine. Theresa Young does not like the heated humidity. Theresa Young would prefer to have heat turned off. Theresa Young is not sure about the different settings or how to adjust humidity setting on her new machine. Theresa Young also reports hearing a clicking noise every night as her machine is ramping. Theresa Young denies concerns with supplies. Theresa Young is resting well. Theresa Young can not sleep without her machine.   Compliance report dated 05/12/2021-06/10/2021 shows that Theresa Young used CPAP 30/30 days for greater than 4 hours. Average usage was 7hr . Residual AHI was 2 on 7-13cmH20. Pressure in the 95th percentile of 12.8. NO significant leak noted.   HISTORY: (copied from Dr Teofilo Pod previous note)  Dear Dr. Milinda Cave,    I saw your patient, Theresa Young, upon your kind request in my sleep clinic today for initial consultation of her sleep disorder, in particular, evaluation of her prior diagnosis of obstructive sleep apnea.  The patient is unaccompanied today.  As you know, Theresa Young is a 66 year old right-handed woman with an underlying medical history of hypertension, hyperlipidemia, hypothyroidism, vitamin D deficiency, osteoarthritis, migraines (followed by Dr. Neale Burly), carpal tunnel syndrome, reflux disease, depression, cervical spondylosis, and severe obesity with a BMI of over 40, who was previously diagnosed with obstructive sleep apnea and placed on positive airway pressure treatment.  I reviewed your office note from 07/13/2020.  I was able to  review her sleep study from 12/19/2013.  Study was interpreted by Dr. Gerilyn Pilgrim time.  Theresa Young probably had a split-night sleep study.  Baseline AHI was reportedly 51/h, O2 nadir 79% with a baseline oxygen saturation of 97%.  Theresa Young was noted to have frequent PVCs.  Theresa Young noted was noted to respond well to CPAP at 10 cm. Her Epworth sleepiness score is 12 out of 24, fatigue severity score 25 out of 63.  Theresa Young lives with her husband and they are raising a great nephew, age 55.  Theresa Young takes him to school.  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  Bedtime is generally between 930 and 10 and rise time around 630, latest by 6:45 AM.  Theresa Young denies recurrent morning headaches but does have nocturia about once per average night.  Theresa Young suspects that her father had sleep apnea, he died in his 72s from heart disease.  Theresa Young uses a Primary school teacher medium size full facemask, sometimes Theresa Young notices the leak.  I was able to review her CPAP compliance data from her machine.  Theresa Young has a S9 escape from [employer].  Pressure at 10 cm, leak and residual AHI information is unfortunately not available.  From 07/28/2020 through 08/26/2020 Theresa Young has used her machine every night with percent use days greater than 4 hours at 100%, indicating superb compliance, average usage of 7 hours and 52 minutes.  He does have some difficulty maintaining sleep at times.  Theresa Young does not take anything for sleep.  Some years ago Theresa Young tried melatonin but had vivid dreams.  Theresa Young follows with Dr. Domingo Cocking once a year.  Theresa Young was able to wean off of imipramine for migraine prevention and uses eletriptan infrequently for acute migraines.  Theresa Young has worked on weight loss.  Theresa Young is working with a program through her insurance. Theresa Young drinks caffeine in the form of soda or tea, about 2 servings per day on average.  Theresa Young drinks alcohol occasionally.  Theresa Young is a non-smoker.  They have no indoor pets, they have outdoor cats.   REVIEW OF SYSTEMS: Out  of a complete 14 system review of symptoms, the patient complains only of the following symptoms, fatigue, anxiety and all other reviewed systems are negative.  ESS: 15  ALLERGIES: Allergies  Allergen Reactions   Bactrim [Sulfamethoxazole-Trimethoprim] Nausea And Vomiting   Erythromycin Hives    REACTION: rash on trunk of body   Iodinated Contrast Media Swelling and Other (See Comments)    Reaction: itching,swelling around the eyes    HOME MEDICATIONS: Outpatient Medications Prior to Visit  Medication Sig Dispense Refill   acetaminophen (TYLENOL 8 HOUR) 650 MG CR tablet Take 650 mg by mouth every 8 (eight) hours as needed for pain.     Cholecalciferol (VITAMIN D) 50 MCG (2000 UT) CAPS Take by mouth daily.  (Patient not taking: Reported on 05/28/2022)     eletriptan (RELPAX) 40 MG tablet Take 40 mg by mouth as needed. may repeat in 2 hours if necessary for migraines     famotidine (PEPCID) 20 MG tablet      levothyroxine (SYNTHROID) 112 MCG tablet TAKE (1) TABLET BY MOUTH EACH MORNING ON AN EMPTY STOMACH. 90 tablet 3   losartan-hydrochlorothiazide (HYZAAR) 100-12.5 MG tablet TAKE (1) TABLET BY MOUTH ONCE DAILY. 90 tablet 3   rosuvastatin (CRESTOR) 10 MG tablet Take 1 tablet (10 mg total) by mouth daily. 90 tablet 3   Vitamin D, Ergocalciferol, (DRISDOL) 1.25 MG (50000 UNIT) CAPS capsule Take 1 capsule (50,000 Units total) by mouth every 7 (seven) days. 5 capsule 0   No facility-administered medications prior to visit.    PAST MEDICAL HISTORY: Past Medical History:  Diagnosis Date   Abdominal pain 2021   CT abd neg. suspect abd wall pain vs functional GI pain   Carpal tunnel syndrome    Cervical spondylosis    Depression    GERD (gastroesophageal reflux disease)    History of MRSA infection    R hand soft tissue   Hyperlipidemia    Hypertension    Hypothyroidism    IFG (impaired fasting glucose)    A1c's 5.5-5.6% 2019-2022   Joint pain    Migraine syndrome    occasional    Obesity, Class II, BMI 35-39.9    OSA on CPAP 11/2013   severe; needs compliance monitoring->referred to neurologist ([facility])   Osteoarthritis    Knees, ankles, fingers, wrists, sometimes hips.  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    PAST SURGICAL HISTORY: Past Surgical History:  Procedure Laterality Date   APPENDECTOMY  6 yrs ago   Dr. Arnoldo Morale   COLONOSCOPY  2012; 02/20/20   approx 2012, normal. 01/2020 polyp x 1->per Dr. Collene Mares recall 5-10 yrs   CYSTOSCOPY     for pain in side; normal   FOOT SURGERY Right 2017   dorsal tarsal exostectomy, endoscopic plantar fasciotomy, bunionectomy with screw and removal  of toenail 2nd toe R foot.   GANGLION CYST EXCISION     polysomnogram  11/2013   severe OSA   THYROIDECTOMY  12/10/2011   Bx benign.  Enlarging goiter.  Procedure: THYROIDECTOMY;  Surgeon: Dalia Heading, MD;  Location: [facility];  Service: General;  Laterality: N/A;  Total Thyroidectomy   TRANSTHORACIC ECHOCARDIOGRAM  05/17/2015   EF 65-70%, mod LVH, normal wall motion, grd I DD, mild MR.    FAMILY HISTORY: Family History  Problem Relation Age of Onset   Hypertension Mother    Cancer Mother        lung   Migraines Mother    Ulcers Mother    Hypertension Father    Migraines Father    Heart disease Father    Ulcers Father    Early death Sister    Diabetes Sister    Diabetes Paternal Grandmother    Kidney disease Paternal Grandfather        kidney failure   Heart attack Paternal Grandfather    Early death Sister    Obesity Sister    Hypertension Sister    Migraines Sister    Cancer Sister        ? cervical cancer   Multiple sclerosis Sister    Breast cancer Sister    Pseudochol deficiency Neg Hx    Malignant hyperthermia Neg Hx    Hypotension Neg Hx    Anesthesia problems Neg Hx     SOCIAL HISTORY: Social History   Socioeconomic History   Marital status: Married     Spouse name: Not on file   Number of children: Not on file   Years of education: Not on file   Highest education level: Master's degree (e.g., MA, MS, MEng, MEd, MSW, MBA)  Occupational History   Not on file  Tobacco Use   Smoking status: Never   Smokeless tobacco: Never  Vaping Use   Vaping Use: Never used  Substance and Sexual Activity   Alcohol use: Yes    Comment: very rarely   Drug use: No   Sexual activity: Yes    Birth control/protection: Post-menopausal  Other Topics Concern   Not on file  Social History Narrative   Married, 3 step children.  Theresa Young raised her nephew who has special needs.   Educ: masters deg   Occup: retired Psychologist, prison and probation services   Rare alcohol.   No tob.   Social Determinants of Health   Financial Resource Strain: Low Risk  (04/24/2022)   Overall Financial Resource Strain (CARDIA)    Difficulty of Paying Living Expenses: Not hard at all  Food Insecurity: No Food Insecurity (04/24/2022)   Hunger Vital Sign    Worried About Running Out of Food in the Last Year: Never true    Ran Out of Food in the Last Year: Never true  Transportation Needs: No Transportation Needs (04/24/2022)   PRAPARE - Administrator, Civil Service (Medical): No    Lack of Transportation (Non-Medical): No  Physical Activity: Insufficiently Active (04/24/2022)   Exercise Vital Sign    Days of Exercise per Week: 2 days    Minutes of Exercise per Session: 10 min  Stress: No Stress Concern Present (04/24/2022)   Theresa Young    Feeling of Stress : Only a little  Social Connections: Socially Integrated (04/24/2022)   Social Connection and Isolation Panel [NHANES]    Frequency of Communication with Friends and Family:  Three times a week    Frequency of Social Gatherings with Friends and Family: Twice a week    Attends Religious Services: More than 4 times per year    Active Member of [employer] or Organizations:  Yes    Attends Engineer, structural: More than 4 times per year    Marital Status: Married  Catering manager Violence: Not At Risk (07/30/2021)   Humiliation, Afraid, Rape, and Kick Young    Fear of Current or Ex-Partner: No    Emotionally Abused: No    Physically Abused: No    Sexually Abused: No     PHYSICAL EXAM  There were no vitals filed for this visit.  There is no height or weight on file to calculate BMI.  Generalized: Well developed, in no acute distress  Cardiology: normal rate and rhythm, no murmur noted Respiratory: clear to auscultation bilaterally  Neurological examination  Mentation: Alert oriented to time, place, history taking. Follows all commands speech and language fluent Cranial nerve II-XII: Pupils were equal round reactive to light. Extraocular movements were full, visual field were full  Motor: The motor testing reveals 5 over 5 strength of all 4 extremities. Good symmetric motor tone is noted throughout.  Gait and station: Gait is normal.    DIAGNOSTIC DATA (LABS, IMAGING, TESTING) - I reviewed patient records, labs, notes, testing and imaging myself where available.      No data to display           Lab Results  Component Value Date   WBC 6.6 10/23/2021   HGB 14.5 10/23/2021   HCT 43.1 10/23/2021   MCV 89.1 10/23/2021   PLT 179.0 10/23/2021      Component Value Date/Time   NA 144 03/20/2022 1008   K 3.5 03/20/2022 1008   CL 102 03/20/2022 1008   CO2 22 03/20/2022 1008   GLUCOSE 108 (H) 03/20/2022 1008   GLUCOSE 110 (H) 10/23/2021 0928   BUN 10 03/20/2022 1008   CREATININE 0.77 03/20/2022 1008   CALCIUM 9.6 03/20/2022 1008   PROT 6.6 03/20/2022 1008   ALBUMIN 4.6 03/20/2022 1008   AST 22 03/20/2022 1008   ALT 23 03/20/2022 1008   ALKPHOS 64 03/20/2022 1008   BILITOT 0.7 03/20/2022 1008   GFRNONAA >60 11/18/2018 1801   GFRAA >60 11/18/2018 1801   Lab Results  Component Value Date   CHOL 144 03/20/2022   HDL  44 03/20/2022   LDLCALC 64 03/20/2022   LDLDIRECT 68.0 10/23/2021   TRIG 219 (H) 03/20/2022   CHOLHDL 3 10/23/2021   Lab Results  Component Value Date   HGBA1C 6.0 (H) 03/20/2022   No results found for: "VITAMINB12" Lab Results  Component Value Date   TSH 3.470 03/20/2022     ASSESSMENT AND PLAN 66 y.o. year old female  has a past medical history of Abdominal pain (2021), Carpal tunnel syndrome, Cervical spondylosis, Depression, GERD (gastroesophageal reflux disease), History of MRSA infection, Hyperlipidemia, Hypertension, Hypothyroidism, IFG (impaired fasting glucose), Joint pain, Migraine syndrome, Obesity, Class II, BMI 35-39.9, OSA on CPAP (11/2013), Osteoarthritis, Postsurgical hypothyroidism (12/21/2014), Pre-diabetes, and Vitamin D deficiency (04/2019). here with   No diagnosis found.     Theresa Young is doing well on CPAP therapy. Compliance report reveals excellent compliance. Theresa Young was encouraged to continue using CPAP nightly and for greater than 4 hours each night. I will ask DME to do some reeducation on her CPAP machine to help identify any concerns with ramping and  setting adjustments. I will reprint download in 6-8 weeks to ensure AHI remains well managed and will adjust pressure settings as indicated. Risks of untreated sleep apnea review and education materials provided. Healthy lifestyle habits encouraged. Theresa Young will follow up in 1 year, sooner if needed. Theresa Young verbalizes understanding and agreement with this plan.    No orders of the defined types were placed in this encounter.    No orders of the defined types were placed in this encounter.      Theresa Dappermy Ledger Heindl, FNP-C 06/10/2022, 3:10 PM [facility] [address], [address] [place], [place] [id number] [phone]

## 2022-06-10 NOTE — Patient Instructions (Signed)

## 2022-06-11 ENCOUNTER — Encounter: Payer: Self-pay | Admitting: Bariatrics

## 2022-06-11 ENCOUNTER — Ambulatory Visit: Payer: PRIVATE HEALTH INSURANCE | Admitting: Bariatrics

## 2022-06-11 VITALS — BP 127/74 | HR 64 | Temp 97.8°F | Ht 67.0 in | Wt 248.0 lb

## 2022-06-11 DIAGNOSIS — E669 Obesity, unspecified: Secondary | ICD-10-CM | POA: Diagnosis not present

## 2022-06-11 DIAGNOSIS — E559 Vitamin D deficiency, unspecified: Secondary | ICD-10-CM

## 2022-06-11 DIAGNOSIS — E7800 Pure hypercholesterolemia, unspecified: Secondary | ICD-10-CM | POA: Diagnosis not present

## 2022-06-11 DIAGNOSIS — E66813 Obesity, class 3: Secondary | ICD-10-CM

## 2022-06-11 DIAGNOSIS — Z6838 Body mass index (BMI) 38.0-38.9, adult: Secondary | ICD-10-CM | POA: Diagnosis not present

## 2022-06-12 ENCOUNTER — Ambulatory Visit (INDEPENDENT_AMBULATORY_CARE_PROVIDER_SITE_OTHER): Payer: PRIVATE HEALTH INSURANCE | Admitting: Family Medicine

## 2022-06-12 ENCOUNTER — Encounter: Payer: Self-pay | Admitting: Family Medicine

## 2022-06-12 VITALS — BP 142/72 | HR 66 | Ht 67.0 in | Wt 254.5 lb

## 2022-06-12 DIAGNOSIS — G4733 Obstructive sleep apnea (adult) (pediatric): Secondary | ICD-10-CM | POA: Diagnosis not present

## 2022-06-16 ENCOUNTER — Telehealth: Payer: Self-pay

## 2022-06-16 NOTE — Progress Notes (Signed)
Chief Complaint:   OBESITY Theresa Young is here to discuss her progress with her obesity treatment plan along with follow-up of her obesity related diagnoses. Theresa Young is on the Category 3 Plan and states she is following her eating plan approximately 90% of the time. Theresa Young states she is walking and lifting weights for 15 minutes 6 times per week.  Today's visit was #: 5 Starting weight: 267 lbs Starting date: 03/20/2022 Today's weight: 248 lbs Today's date: 06/11/2022 Total lbs lost to date: 19 Total lbs lost since last in-office visit: 1  Interim History: Theresa Young is down 1 pound since her last visit.  She is doing well with making better choices.  Subjective:   1. Vitamin D deficiency Theresa Young is taking Vitamin D.   2. Hypercholesterolemia Theresa Young is taking Crestor.   Assessment/Plan:   1. Vitamin D deficiency Theresa Young will continue Vitamin D as directed.   2. Hypercholesterolemia Handouts on Healthy fats was given and discussed with the patient today.   3. Obesity, Current BMI 38.8 Theresa Young is currently in the action stage of change. As such, her goal is to continue with weight loss efforts. She has agreed to the Category 3 Plan.   Meal planning and intentional eating were discussed.  Healthy versus unhealthy fats.  Exercise goals: As is.   Behavioral modification strategies: increasing lean protein intake, decreasing simple carbohydrates, increasing vegetables, increasing water intake, decreasing eating out, no skipping meals, meal planning and cooking strategies, keeping healthy foods in the home, and planning for success.  Theresa Young has agreed to follow-up with our clinic in 2 weeks with Everardo Pacific, FNP-C. She was informed of the importance of frequent follow-up visits to maximize her success with intensive lifestyle modifications for her multiple health conditions.   Objective:   Blood pressure 127/74, pulse 64, temperature 97.8 F (36.6 C), height 5\' 7"  (1.702 m), weight  248 lb (112.5 kg), SpO2 99 %. Body mass index is 38.84 kg/m.  General: Cooperative, alert, well developed, in no acute distress. HEENT: Conjunctivae and lids unremarkable. Cardiovascular: Regular rhythm.  Lungs: Normal work of breathing. Neurologic: No focal deficits.   Lab Results  Component Value Date   CREATININE 0.77 03/20/2022   BUN 10 03/20/2022   NA 144 03/20/2022   K 3.5 03/20/2022   CL 102 03/20/2022   CO2 22 03/20/2022   Lab Results  Component Value Date   ALT 23 03/20/2022   AST 22 03/20/2022   ALKPHOS 64 03/20/2022   BILITOT 0.7 03/20/2022   Lab Results  Component Value Date   HGBA1C 6.0 (H) 03/20/2022   HGBA1C 5.8 10/23/2021   HGBA1C 5.6 10/08/2020   HGBA1C 5.5 08/11/2019   HGBA1C 5.6 05/25/2019   Lab Results  Component Value Date   INSULIN 30.1 (H) 03/20/2022   Lab Results  Component Value Date   TSH 3.470 03/20/2022   Lab Results  Component Value Date   CHOL 144 03/20/2022   HDL 44 03/20/2022   LDLCALC 64 03/20/2022   LDLDIRECT 68.0 10/23/2021   TRIG 219 (H) 03/20/2022   CHOLHDL 3 10/23/2021   Lab Results  Component Value Date   VD25OH 36.0 03/20/2022   VD25OH 23.62 (L) 04/06/2020   Lab Results  Component Value Date   WBC 6.6 10/23/2021   HGB 14.5 10/23/2021   HCT 43.1 10/23/2021   MCV 89.1 10/23/2021   PLT 179.0 10/23/2021   No results found for: "IRON", "TIBC", "FERRITIN"  Attestation Statements:   Reviewed by clinician  on day of visit: allergies, medications, problem list, medical history, surgical history, family history, social history, and previous encounter notes.   Wilhemena Durie, am acting as Location manager for [employer], DO.  I have reviewed the above documentation for accuracy and completeness, and I agree with the above. Theresa Lesch, DO

## 2022-06-17 ENCOUNTER — Encounter: Payer: Self-pay | Admitting: Bariatrics

## 2022-06-26 ENCOUNTER — Encounter: Payer: Self-pay | Admitting: Nurse Practitioner

## 2022-06-26 ENCOUNTER — Ambulatory Visit: Payer: PRIVATE HEALTH INSURANCE | Admitting: Nurse Practitioner

## 2022-06-26 VITALS — BP 122/84 | HR 77 | Temp 98.1°F | Ht 67.0 in | Wt 248.0 lb

## 2022-06-26 DIAGNOSIS — E559 Vitamin D deficiency, unspecified: Secondary | ICD-10-CM

## 2022-06-26 DIAGNOSIS — E669 Obesity, unspecified: Secondary | ICD-10-CM | POA: Diagnosis not present

## 2022-06-26 DIAGNOSIS — G4733 Obstructive sleep apnea (adult) (pediatric): Secondary | ICD-10-CM

## 2022-06-26 DIAGNOSIS — R1310 Dysphagia, unspecified: Secondary | ICD-10-CM | POA: Insufficient documentation

## 2022-06-26 DIAGNOSIS — Z6838 Body mass index (BMI) 38.0-38.9, adult: Secondary | ICD-10-CM

## 2022-06-30 NOTE — Progress Notes (Signed)
Chief Complaint:   OBESITY Theresa Young is here to discuss her progress with her obesity treatment plan along with follow-up of her obesity related diagnoses. Theresa Young is on the Category 3 Plan and states she is following her eating plan approximately 754% of the time. Theresa Young states she is walking/weight lifting 15-25 minutes 6 times per week.`  Today's visit was #: 6 Starting weight: 267 lbs Starting date: 03/20/2022 Today's weight: 248 lbs Today's date: 06/26/2022 Total lbs lost to date: 19 lbs Total lbs lost since last in-office visit: 0  Interim History: Theresa Young reports, that it has been rough since her last visit. She was not able to follow her plan or exercise due to struggling with her migraines. Doing better this week and is now back on plan and has restarted exercising.  Subjective:   1. Vitamin D deficiency Cambre is currently taking prescription Vit D 50,000 IU once a week. Denies any nausea, vomiting or muscle weakness.  2. Obstructive sleep apnea syndrome Ayane saw neurology last on 06/12/22. Doing well and using CPAP nightly.  3. Dysphagia, unspecified type Theresa Young has not had difficulty swallowing especially meats/protein over the past 9-10 months. Seeing GI next week. She has never had an EDG. Taking Pepcid 20 mg as needed.   Assessment/Plan:   1. Vitamin D deficiency Low Vitamin D level contributes to fatigue and are associated with obesity, breast, and colon cancer. She agrees to continue to take prescription Vitamin D @50 ,000 IU every week and will follow-up for routine testing of Vitamin D, at least 2-3 times per year to avoid over-replacement.   2. Obstructive sleep apnea syndrome Continue to follow up with neurology and using CPAP nightly.  3. Dysphagia, unspecified type  Keep appointment with GI.  4. Obesity, Current BMI 38.9 Theresa Young is currently in the action stage of change. As such, her goal is to continue with weight loss efforts. She has agreed to the Category 3  Plan.   Exercise goals: As is.  Behavioral modification strategies: increasing lean protein intake, increasing vegetables, and increasing water intake.  [name] has agreed to follow-up with our clinic in 3 weeks. She was informed of the importance of frequent follow-up visits to maximize her success with intensive lifestyle modifications for her multiple health conditions.   Objective:   Blood pressure 122/84, pulse 77, temperature 98.1 F (36.7 C), temperature source Oral, height 5\' 7"  (1.702 m), weight 248 lb (112.5 kg), SpO2 99 %. Body mass index is 38.84 kg/m.  General: Cooperative, alert, well developed, in no acute distress. HEENT: Conjunctivae and lids unremarkable. Cardiovascular: Regular rhythm.  Lungs: Normal work of breathing. Neurologic: No focal deficits.   Lab Results  Component Value Date   CREATININE 0.77 03/20/2022   BUN 10 03/20/2022   NA 144 03/20/2022   K 3.5 03/20/2022   CL 102 03/20/2022   CO2 22 03/20/2022   Lab Results  Component Value Date   ALT 23 03/20/2022   AST 22 03/20/2022   ALKPHOS 64 03/20/2022   BILITOT 0.7 03/20/2022   Lab Results  Component Value Date   HGBA1C 6.0 (H) 03/20/2022   HGBA1C 5.8 10/23/2021   HGBA1C 5.6 10/08/2020   HGBA1C 5.5 08/11/2019   HGBA1C 5.6 05/25/2019   Lab Results  Component Value Date   INSULIN 30.1 (H) 03/20/2022   Lab Results  Component Value Date   TSH 3.470 03/20/2022   Lab Results  Component Value Date   CHOL 144 03/20/2022   HDL 44  03/20/2022   LDLCALC 64 03/20/2022   LDLDIRECT 68.0 10/23/2021   TRIG 219 (H) 03/20/2022   CHOLHDL 3 10/23/2021   Lab Results  Component Value Date   VD25OH 36.0 03/20/2022   VD25OH 23.62 (L) 04/06/2020   Lab Results  Component Value Date   WBC 6.6 10/23/2021   HGB 14.5 10/23/2021   HCT 43.1 10/23/2021   MCV 89.1 10/23/2021   PLT 179.0 10/23/2021   No results found for: "IRON", "TIBC", "FERRITIN"  Attestation Statements:   Reviewed by clinician on  day of visit: allergies, medications, problem list, medical history, surgical history, family history, social history, and previous encounter notes.  Time spent on visit including pre-visit chart review and post-visit care and charting was 30 minutes.   I, Brendell Tyus, RMA, am acting as transcriptionist for Irene Limbo, FNP.  I have reviewed the above documentation for accuracy and completeness, and I agree with the above. Irene Limbo, FNP

## 2022-07-03 ENCOUNTER — Other Ambulatory Visit: Payer: Self-pay | Admitting: Gastroenterology

## 2022-07-03 DIAGNOSIS — R1310 Dysphagia, unspecified: Secondary | ICD-10-CM

## 2022-07-16 ENCOUNTER — Other Ambulatory Visit: Payer: PRIVATE HEALTH INSURANCE

## 2022-07-21 ENCOUNTER — Other Ambulatory Visit: Payer: PRIVATE HEALTH INSURANCE

## 2022-07-22 ENCOUNTER — Encounter: Payer: Self-pay | Admitting: Bariatrics

## 2022-07-22 ENCOUNTER — Ambulatory Visit: Payer: PRIVATE HEALTH INSURANCE | Admitting: Bariatrics

## 2022-07-22 VITALS — BP 124/75 | HR 73 | Temp 98.5°F | Ht 67.0 in | Wt 247.0 lb

## 2022-07-22 DIAGNOSIS — E559 Vitamin D deficiency, unspecified: Secondary | ICD-10-CM | POA: Diagnosis not present

## 2022-07-22 DIAGNOSIS — E7800 Pure hypercholesterolemia, unspecified: Secondary | ICD-10-CM

## 2022-07-22 DIAGNOSIS — E669 Obesity, unspecified: Secondary | ICD-10-CM

## 2022-07-22 DIAGNOSIS — Z6838 Body mass index (BMI) 38.0-38.9, adult: Secondary | ICD-10-CM

## 2022-07-22 MED ORDER — VITAMIN D (ERGOCALCIFEROL) 1.25 MG (50000 UNIT) PO CAPS: 50000.0000 [IU] | ORAL_CAPSULE | ORAL | 0 refills | Status: DC

## 2022-07-25 ENCOUNTER — Ambulatory Visit
Admission: RE | Admit: 2022-07-25 | Discharge: 2022-07-25 | Disposition: A | Discharge status: Home / Self Care | Payer: PRIVATE HEALTH INSURANCE | Source: Ambulatory Visit | Attending: Gastroenterology | Admitting: Gastroenterology

## 2022-07-25 DIAGNOSIS — R1310 Dysphagia, unspecified: Secondary | ICD-10-CM

## 2022-07-29 ENCOUNTER — Encounter: Payer: Self-pay | Admitting: Family Medicine

## 2022-07-29 NOTE — Progress Notes (Unsigned)
Chief Complaint:   OBESITY Theresa Young is here to discuss her progress with her obesity treatment plan along with follow-up of her obesity related diagnoses. Theresa Young is on the Category 3 Plan and states she is following her eating plan approximately 65% of the time. Theresa Young states she is walking for 20 minutes 3 times per week.  Today's visit was #: 7 Starting weight: 267 lbs Starting date: 03/20/2022 Today's weight: 247 lbs Today's date: 07/22/2022 Total lbs lost to date: 20 Total lbs lost since last in-office visit: 1  Interim History: Theresa Young is down 7 additional pounds over Thanksgiving holiday. She is keeping her protein high. She is getting adequate water.   Subjective:   1. Vitamin D deficiency Theresa Young is taking Vitamin D as directed.   2. Hypercholesterolemia Theresa Young is taking Crestor.   Assessment/Plan:   1. Vitamin D deficiency We will refill prescription Vitamin D 50,000 IU every week for 1 month.   - Vitamin D, Ergocalciferol, (DRISDOL) 1.25 MG (50000 UNIT) CAPS capsule; Take 1 capsule (50,000 Units total) by mouth every 7 (seven) days.  Dispense: 5 capsule; Refill: 0  2. Hypercholesterolemia Theresa Young will continue Crestor, and will change to "healthy fats".   3. Obesity, Current BMI 38.8 Theresa Young is currently in the action stage of change. As such, her goal is to continue with weight loss efforts. She has agreed to the Category 3 Plan.   Meal planning and intentional eating were discussed.   Exercise goals: As is, increase walking.   Behavioral modification strategies: increasing lean protein intake, decreasing simple carbohydrates, increasing vegetables, increasing water intake, decreasing eating out, no skipping meals, meal planning and cooking strategies, keeping healthy foods in the home, and planning for success.  Theresa Young has agreed to follow-up with our clinic in 3 weeks. She was informed of the importance of frequent follow-up visits to maximize her success with intensive  lifestyle modifications for her multiple health conditions.   Objective:   Blood pressure 124/75, pulse 73, temperature 98.5 F (36.9 C), height 5\' 7"  (1.702 m), weight 247 lb (112 kg), SpO2 98 %. Body mass index is 38.69 kg/m.  General: Cooperative, alert, well developed, in no acute distress. HEENT: Conjunctivae and lids unremarkable. Cardiovascular: Regular rhythm.  Lungs: Normal work of breathing. Neurologic: No focal deficits.   Lab Results  Component Value Date   CREATININE 0.77 03/20/2022   BUN 10 03/20/2022   NA 144 03/20/2022   K 3.5 03/20/2022   CL 102 03/20/2022   CO2 22 03/20/2022   Lab Results  Component Value Date   ALT 23 03/20/2022   AST 22 03/20/2022   ALKPHOS 64 03/20/2022   BILITOT 0.7 03/20/2022   Lab Results  Component Value Date   HGBA1C 6.0 (H) 03/20/2022   HGBA1C 5.8 10/23/2021   HGBA1C 5.6 10/08/2020   HGBA1C 5.5 08/11/2019   HGBA1C 5.6 05/25/2019   Lab Results  Component Value Date   INSULIN 30.1 (H) 03/20/2022   Lab Results  Component Value Date   TSH 3.470 03/20/2022   Lab Results  Component Value Date   CHOL 144 03/20/2022   HDL 44 03/20/2022   LDLCALC 64 03/20/2022   LDLDIRECT 68.0 10/23/2021   TRIG 219 (H) 03/20/2022   CHOLHDL 3 10/23/2021   Lab Results  Component Value Date   VD25OH 36.0 03/20/2022   VD25OH 23.62 (L) 04/06/2020   Lab Results  Component Value Date   WBC 6.6 10/23/2021   HGB 14.5 10/23/2021  HCT 43.1 10/23/2021   MCV 89.1 10/23/2021   PLT 179.0 10/23/2021   No results found for: "IRON", "TIBC", "FERRITIN"  Attestation Statements:   Reviewed by clinician on day of visit: allergies, medications, problem list, medical history, surgical history, family history, social history, and previous encounter notes.   Trude Mcburney, am acting as Energy manager for [employer], DO.  I have reviewed the above documentation for accuracy and completeness, and I agree with the above. Corinna Capra,  DO

## 2022-07-30 ENCOUNTER — Encounter: Payer: Self-pay | Admitting: Bariatrics

## 2022-07-30 ENCOUNTER — Ambulatory Visit: Payer: PRIVATE HEALTH INSURANCE

## 2022-07-31 ENCOUNTER — Encounter: Payer: Self-pay | Admitting: Family Medicine

## 2022-07-31 NOTE — Telephone Encounter (Signed)
Hi Theresa Young, I think it is fine to NOT take antiviral. Hope you feel better soon.

## 2022-08-07 ENCOUNTER — Encounter: Payer: Self-pay | Admitting: Bariatrics

## 2022-08-07 ENCOUNTER — Ambulatory Visit: Payer: PRIVATE HEALTH INSURANCE | Admitting: Bariatrics

## 2022-08-07 VITALS — BP 123/77 | HR 69 | Temp 98.0°F | Ht 67.0 in | Wt 245.0 lb

## 2022-08-07 DIAGNOSIS — E669 Obesity, unspecified: Secondary | ICD-10-CM

## 2022-08-07 DIAGNOSIS — R7303 Prediabetes: Secondary | ICD-10-CM | POA: Diagnosis not present

## 2022-08-07 DIAGNOSIS — E559 Vitamin D deficiency, unspecified: Secondary | ICD-10-CM

## 2022-08-07 DIAGNOSIS — Z6838 Body mass index (BMI) 38.0-38.9, adult: Secondary | ICD-10-CM

## 2022-08-07 MED ORDER — VITAMIN D (ERGOCALCIFEROL) 1.25 MG (50000 UNIT) PO CAPS: 50000.0000 [IU] | ORAL_CAPSULE | ORAL | 0 refills | Status: DC

## 2022-08-19 ENCOUNTER — Encounter: Payer: Self-pay | Admitting: Bariatrics

## 2022-08-19 NOTE — Progress Notes (Signed)
Chief Complaint:   OBESITY Theresa Young is here to discuss her progress with her obesity treatment plan along with follow-up of her obesity related diagnoses. Theresa Young is on the Category 3 Plan and states she is following her eating plan approximately 85% of the time. Theresa Young states she is walking for 15 minutes 3 times per week.  Today's visit was #: 8 Starting weight: 267 lbs Starting date: 03/20/2022 Today's weight: 245 lbs Today's date: 08/07/2022 Total lbs lost to date: 22 Total lbs lost since last in-office visit: 2  Interim History: Theresa Young is down 2 lbs since her last visit.   Subjective:   1. Vitamin D deficiency Theresa Young is taking prescription vitamin D, and she notes her energy level is good.  2. Prediabetes Theresa Young is not on medications currently.  Assessment/Plan:   1. Vitamin D deficiency Will refill prescription vitamin D 50,000 IU every week for 1 month.  - Vitamin D, Ergocalciferol, (DRISDOL) 1.25 MG (50000 UNIT) CAPS capsule; Take 1 capsule (50,000 Units total) by mouth every 7 (seven) days.  Dispense: 5 capsule; Refill: 0  2. Prediabetes Theresa Young will keep all sweets and starches low.  She will keep healthy nonstarchy vegetables high.  3. Obesity, Current BMI 38.4 Theresa Young is currently in the action stage of change. As such, her goal is to continue with weight loss efforts. She has agreed to the Category 3 Plan.   Meal planning and intentional eating were discussed.  Better choices and smart choices.  Decrease carbohydrates and increase protein.  Exercise goals: As is.   Behavioral modification strategies: increasing lean protein intake, decreasing simple carbohydrates, increasing vegetables, increasing water intake, decreasing eating out, no skipping meals, meal planning and cooking strategies, keeping healthy foods in the home, and planning for success.  Theresa Young has agreed to follow-up with our clinic in 3 to 4 weeks. She was informed of the importance of frequent follow-up  visits to maximize her success with intensive lifestyle modifications for her multiple health conditions.   Objective:   Blood pressure 123/77, pulse 69, temperature 98 F (36.7 C), height 5\' 7"  (1.702 m), weight 245 lb (111.1 kg), SpO2 98 %. Body mass index is 38.37 kg/m.  General: Cooperative, alert, well developed, in no acute distress. HEENT: Conjunctivae and lids unremarkable. Cardiovascular: Regular rhythm.  Lungs: Normal work of breathing. Neurologic: No focal deficits.   Lab Results  Component Value Date   CREATININE 0.77 03/20/2022   BUN 10 03/20/2022   NA 144 03/20/2022   K 3.5 03/20/2022   CL 102 03/20/2022   CO2 22 03/20/2022   Lab Results  Component Value Date   ALT 23 03/20/2022   AST 22 03/20/2022   ALKPHOS 64 03/20/2022   BILITOT 0.7 03/20/2022   Lab Results  Component Value Date   HGBA1C 6.0 (H) 03/20/2022   HGBA1C 5.8 10/23/2021   HGBA1C 5.6 10/08/2020   HGBA1C 5.5 08/11/2019   HGBA1C 5.6 05/25/2019   Lab Results  Component Value Date   INSULIN 30.1 (H) 03/20/2022   Lab Results  Component Value Date   TSH 3.470 03/20/2022   Lab Results  Component Value Date   CHOL 144 03/20/2022   HDL 44 03/20/2022   LDLCALC 64 03/20/2022   LDLDIRECT 68.0 10/23/2021   TRIG 219 (H) 03/20/2022   CHOLHDL 3 10/23/2021   Lab Results  Component Value Date   VD25OH 36.0 03/20/2022   VD25OH 23.62 (L) 04/06/2020   Lab Results  Component Value Date  WBC 6.6 10/23/2021   HGB 14.5 10/23/2021   HCT 43.1 10/23/2021   MCV 89.1 10/23/2021   PLT 179.0 10/23/2021   No results found for: "IRON", "TIBC", "FERRITIN"  Attestation Statements:   Reviewed by clinician on day of visit: allergies, medications, problem list, medical history, surgical history, family history, social history, and previous encounter notes.   Trude Mcburney, am acting as Energy manager for [employer], DO.  I have reviewed the above documentation for accuracy and completeness, and  I agree with the above. Corinna Capra, DO

## 2022-09-02 ENCOUNTER — Ambulatory Visit (INDEPENDENT_AMBULATORY_CARE_PROVIDER_SITE_OTHER): Payer: PRIVATE HEALTH INSURANCE | Admitting: Adult Health

## 2022-09-02 ENCOUNTER — Encounter: Payer: Self-pay | Admitting: Adult Health

## 2022-09-02 VITALS — BP 122/78 | HR 78 | Ht 67.0 in | Wt 257.0 lb

## 2022-09-02 DIAGNOSIS — Z01419 Encounter for gynecological examination (general) (routine) without abnormal findings: Secondary | ICD-10-CM | POA: Diagnosis not present

## 2022-09-02 DIAGNOSIS — Z1211 Encounter for screening for malignant neoplasm of colon: Secondary | ICD-10-CM | POA: Diagnosis not present

## 2022-09-02 DIAGNOSIS — K429 Umbilical hernia without obstruction or gangrene: Secondary | ICD-10-CM | POA: Diagnosis not present

## 2022-09-02 DIAGNOSIS — F419 Anxiety disorder, unspecified: Secondary | ICD-10-CM | POA: Diagnosis not present

## 2022-09-02 LAB — HEMOCCULT GUIAC POC 1CARD (OFFICE): Fecal Occult Blood, POC: NEGATIVE

## 2022-09-02 MED ORDER — BUSPIRONE HCL 5 MG PO TABS: 5.0000 mg | ORAL_TABLET | Freq: Two times a day (BID) | ORAL | 6 refills | Status: DC

## 2022-09-02 NOTE — Progress Notes (Addendum)
Patient ID: Theresa Young, female   DOB: 09/08/1955, 67 y.o.   MRN: [id number] History of Present Illness: Theresa Young is a 67 year old white female,married, PM in for a well woman gyn exam. She is having anxiety, mind races and not sleeping as well. Has had some swallowing issues and sees Dr Collene Mares, to get EGD. Had COVID in December, and has had 2 friends die recently. She is raising 65 year old great nephew.   Last pap was negative HPV and NILM 07/25/20.  PCP is Dr Anitra Lauth  Current Medications, Allergies, Past Medical History, Past Surgical History, Family History and Social History were reviewed in [facility] record.     Review of Systems: Patient denies any headaches, hearing loss, fatigue, blurred vision, shortness of breath, chest pain, abdominal pain, problems with bowel movements, urination, or intercourse(not often). No joint pain or mood swings.  Has swallowing issues Denies any vaginal bleeding. +anxiety    Physical Exam:BP 122/78 (BP Location: Left Arm, Patient Position: Sitting, Cuff Size: Large)   Pulse 78   Ht 5\' 7"  (1.702 m)   Wt 257 lb (116.6 kg)   BMI 40.25 kg/m   General:  Well developed, well nourished, no acute distress Skin:  Warm and dry Neck:  Midline trachea, normal thyroid, good ROM, no lymphadenopathy, no carotid bruits heard  Lungs; Clear to auscultation bilaterally Breast:  No dominant palpable mass, retraction, or nipple discharge Cardiovascular: Regular rate and rhythm Abdomen:  Soft, non tender, no hepatosplenomegaly,small umbilical hernia  Pelvic:  External genitalia is normal in appearance, no lesions.  The vagina is pale with loss of moisture and rugae. Urethra has no lesions or masses. The cervix is smooth.  Uterus is felt to be normal size, shape, and contour.  No adnexal masses or tenderness noted.Bladder is non tender, no masses felt. Rectal: Good sphincter tone, no polyps, or hemorrhoids felt.  Hemoccult  negative. Extremities/musculoskeletal:  No swelling or varicosities noted, no clubbing or cyanosis Psych:  No mood changes, alert and cooperative,seems happy AA is 1 Fall risk is low    09/02/2022    8:39 AM 04/25/2022    9:05 AM 03/20/2022    8:32 AM  Depression screen PHQ 2/9  Decreased Interest 0 0 1  Down, Depressed, Hopeless 0 0 2  PHQ - 2 Score 0 0 3  Altered sleeping 1  1  Tired, decreased energy 1  3  Change in appetite 0  2  Feeling bad or failure about yourself  0  0  Trouble concentrating 0  1  Moving slowly or fidgety/restless 1  2  Suicidal thoughts 0  0  PHQ-9 Score 3  12  Difficult doing work/chores   Somewhat difficult       09/02/2022    8:39 AM 07/30/2021    2:43 PM 07/25/2020    9:18 AM  GAD 7 : Generalized Anxiety Score  Nervous, Anxious, on Edge 1 0 0  Control/stop worrying 1 0 0  Worry too much - different things 1 0 0  Trouble relaxing 1 0 0  Restless 1 0 0  Easily annoyed or irritable 0 0 0  Afraid - awful might happen 1 0 1  Total GAD 7 Score 6 0 1      Upstream - 09/02/22 0849       Pregnancy Intention Screening   Does the patient want to become pregnant in the next year? N/A    Does the patient's partner want  to become pregnant in the next year? N/A    Would the patient like to discuss contraceptive options today? N/A      Contraception Wrap Up   Current Method No Method - Other Reason   postmenopausal   Reason for No Current Contraceptive Method at Intake ([facility] Only) Other    End Method No Method - Other Reason   postmenopausal   Contraception Counseling Provided No            Examination chaperoned by Malachy Mood LPN   Impression and Plan: 1. Encounter for well woman exam with routine gynecological exam Pap and physical in 1 year Mammogram was negative 03/24/22. Colonoscopy per Dr Shelia Media with PCP  2. Encounter for screening fecal occult blood testing Hemoccult was negative   3. Umbilical hernia without obstruction and  without gangrene   4. Anxiety +anxiety, mind races, doing deep breathing Will try Buspar Meds ordered this encounter  Medications   busPIRone (BUSPAR) 5 MG tablet    Sig: Take 1 tablet (5 mg total) by mouth 2 (two) times daily.    Dispense:  60 tablet    Refill:  6    Order Specific Question:   Supervising Provider    Answer:   Duane Lope H [[id number]]    Follow up in 8 weeks for ROS but call me before if needed

## 2022-09-08 ENCOUNTER — Encounter: Payer: Self-pay | Admitting: Bariatrics

## 2022-09-08 ENCOUNTER — Ambulatory Visit: Payer: PRIVATE HEALTH INSURANCE | Admitting: Bariatrics

## 2022-09-08 VITALS — BP 128/78 | HR 71 | Temp 98.2°F | Ht 67.0 in | Wt 250.0 lb

## 2022-09-08 DIAGNOSIS — E559 Vitamin D deficiency, unspecified: Secondary | ICD-10-CM | POA: Diagnosis not present

## 2022-09-08 DIAGNOSIS — E669 Obesity, unspecified: Secondary | ICD-10-CM | POA: Diagnosis not present

## 2022-09-08 DIAGNOSIS — Z6839 Body mass index (BMI) 39.0-39.9, adult: Secondary | ICD-10-CM

## 2022-09-08 DIAGNOSIS — E7800 Pure hypercholesterolemia, unspecified: Secondary | ICD-10-CM | POA: Diagnosis not present

## 2022-09-08 MED ORDER — VITAMIN D (ERGOCALCIFEROL) 1.25 MG (50000 UNIT) PO CAPS: 50000.0000 [IU] | ORAL_CAPSULE | ORAL | 0 refills | Status: DC

## 2022-09-15 NOTE — Progress Notes (Signed)
Chief Complaint:   OBESITY Theresa Young is here to discuss her progress with her obesity treatment plan along with follow-up of her obesity related diagnoses. Charish is on the Category 3 Plan and states she is following her eating plan approximately 30% of the time. Audris states she has not been exercising.  Today's visit was #: 9 Starting weight: 267 lbs Starting date: 03/20/22 Today's weight: 250 lbs Today's date: 09/08/22 Total lbs lost to date: 17 Total lbs lost since last in-office visit: +5  Interim History: She is up 5 pounds since her last visit which was over the holiday.  According to the bioimpedance scale she is up 1 pound of water.  She has been under more stress and is now on BuSpar.  Subjective:   1. Vitamin D deficiency Taking vitamin D.  2. Hypercholesterolemia Taking Crestor.  Assessment/Plan:   1. Vitamin D deficiency Refill: - Vitamin D, Ergocalciferol, (DRISDOL) 1.25 MG (50000 UNIT) CAPS capsule; Take 1 capsule (50,000 Units total) by mouth every 7 (seven) days.  Dispense: 5 capsule; Refill: 0  2. Hypercholesterolemia 1. Continue medication 2.  Sheet-healthy and unhealthy fats.  3. Obesity, Current BMI 39.2 1.  Meal planning 2.  Intentional eating 3.  Increase water, increase protein   Mehar is currently in the action stage of change. As such, her goal is to continue with weight loss efforts. She has agreed to the Category 3 Plan.   Exercise goals: deep breathing, cardio,  and some resistance exercises  Behavioral modification strategies: increasing lean protein intake, decreasing simple carbohydrates, increasing vegetables, increasing water intake, decreasing eating out, no skipping meals, meal planning and cooking strategies, keeping healthy foods in the home, and planning for success.  Raymie has agreed to follow-up with our clinic in 4 weeks, fasting. She was informed of the importance of frequent follow-up visits to maximize her success with  intensive lifestyle modifications for her multiple health conditions.    Objective:   Blood pressure 128/78, pulse 71, temperature 98.2 F (36.8 C), height 5\' 7"  (1.702 m), weight 250 lb (113.4 kg), SpO2 100 %. Body mass index is 39.16 kg/m.  General: Cooperative, alert, well developed, in no acute distress. HEENT: Conjunctivae and lids unremarkable. Cardiovascular: Regular rhythm.  Lungs: Normal work of breathing. Neurologic: No focal deficits.   Lab Results  Component Value Date   CREATININE 0.77 03/20/2022   BUN 10 03/20/2022   NA 144 03/20/2022   K 3.5 03/20/2022   CL 102 03/20/2022   CO2 22 03/20/2022   Lab Results  Component Value Date   ALT 23 03/20/2022   AST 22 03/20/2022   ALKPHOS 64 03/20/2022   BILITOT 0.7 03/20/2022   Lab Results  Component Value Date   HGBA1C 6.0 (H) 03/20/2022   HGBA1C 5.8 10/23/2021   HGBA1C 5.6 10/08/2020   HGBA1C 5.5 08/11/2019   HGBA1C 5.6 05/25/2019   Lab Results  Component Value Date   INSULIN 30.1 (H) 03/20/2022   Lab Results  Component Value Date   TSH 3.470 03/20/2022   Lab Results  Component Value Date   CHOL 144 03/20/2022   HDL 44 03/20/2022   LDLCALC 64 03/20/2022   LDLDIRECT 68.0 10/23/2021   TRIG 219 (H) 03/20/2022   CHOLHDL 3 10/23/2021   Lab Results  Component Value Date   VD25OH 36.0 03/20/2022   VD25OH 23.62 (L) 04/06/2020   Lab Results  Component Value Date   WBC 6.6 10/23/2021   HGB 14.5 10/23/2021  HCT 43.1 10/23/2021   MCV 89.1 10/23/2021   PLT 179.0 10/23/2021   No results found for: "IRON", "TIBC", "FERRITIN"   Attestation Statements:   Reviewed by clinician on day of visit: allergies, medications, problem list, medical history, surgical history, family history, social history, and previous encounter notes.  I, Dawn Whitmire, FNP-C, am acting as transcriptionist for Dr. Jearld Lesch.  I have reviewed the above documentation for accuracy and completeness, and I agree with the above.  Jearld Lesch, DO

## 2022-09-16 ENCOUNTER — Encounter: Payer: Self-pay | Admitting: Bariatrics

## 2022-10-06 ENCOUNTER — Ambulatory Visit: Payer: PRIVATE HEALTH INSURANCE | Admitting: Bariatrics

## 2022-10-06 ENCOUNTER — Encounter: Payer: Self-pay | Admitting: Bariatrics

## 2022-10-06 VITALS — BP 121/71 | HR 77 | Temp 97.7°F | Ht 67.0 in | Wt 251.0 lb

## 2022-10-06 DIAGNOSIS — Z6839 Body mass index (BMI) 39.0-39.9, adult: Secondary | ICD-10-CM

## 2022-10-06 DIAGNOSIS — R7303 Prediabetes: Secondary | ICD-10-CM

## 2022-10-06 DIAGNOSIS — E559 Vitamin D deficiency, unspecified: Secondary | ICD-10-CM | POA: Diagnosis not present

## 2022-10-06 MED ORDER — VITAMIN D (ERGOCALCIFEROL) 1.25 MG (50000 UNIT) PO CAPS: 50000.0000 [IU] | ORAL_CAPSULE | ORAL | 0 refills | Status: DC

## 2022-10-13 ENCOUNTER — Other Ambulatory Visit: Payer: Self-pay | Admitting: Family Medicine

## 2022-10-16 NOTE — Progress Notes (Signed)
Chief Complaint:   OBESITY Theresa Young is here to discuss her progress with her obesity treatment plan along with follow-up of her obesity related diagnoses. Theresa Young is on the Category 3 plan and states she is following her eating plan approximately 30% of the time. Theresa Young states she is walking for 15 minutes 2 times per week.  Today's visit was #: 10 Starting weight: 267 lbs Starting date: 03/20/22 Today's weight: 251 lbs Today's date: 10/06/22 Total lbs lost to date: 16 Total lbs lost since last in-office visit: +1  Interim History: She is up 1 pound since her last visit.  She has not been on the plan as well.  Subjective:   1. Vitamin D deficiency Taking as directed.  2. Prediabetes No medication.  Assessment/Plan:   1. Vitamin D deficiency Refill: - Vitamin D, Ergocalciferol, (DRISDOL) 1.25 MG (50000 UNIT) CAPS capsule; Take 1 capsule (50,000 Units total) by mouth every 7 (seven) days.  Dispense: 5 capsule; Refill: 0  2. Prediabetes 1.  Will minimize all carbohydrates (sweets and starches).  3. Morbid obesity, BMI 39.0-39.9,adult 1.  Meal planning 2.  Intentional eating 3.  Will get started back on the plan-category 3. 4.  Green tea in the afternoon.  Theresa Young is currently in the action stage of change. As such, her goal is to continue with weight loss efforts. She has agreed to the Category 3 plan.  Exercise goals: Continue walking when at soccer practice  Behavioral modification strategies: increasing lean protein intake, decreasing simple carbohydrates, increasing vegetables, increasing water intake, decreasing eating out, no skipping meals, meal planning and cooking strategies, keeping healthy foods in the home, and planning for success.  Theresa Young has agreed to follow-up with our clinic in 4 weeks. She was informed of the importance of frequent follow-up visits to maximize her success with intensive lifestyle modifications for her multiple health conditions.     Objective:   Blood pressure 121/71, pulse 77, temperature 97.7 F (36.5 C), height '5\' 7"'$  (1.702 m), weight 251 lb (113.9 kg), SpO2 98 %. Body mass index is 39.31 kg/m.  General: Cooperative, alert, well developed, in no acute distress. HEENT: Conjunctivae and lids unremarkable. Cardiovascular: Regular rhythm.  Lungs: Normal work of breathing. Neurologic: No focal deficits.   Lab Results  Component Value Date   CREATININE 0.77 03/20/2022   BUN 10 03/20/2022   NA 144 03/20/2022   K 3.5 03/20/2022   CL 102 03/20/2022   CO2 22 03/20/2022   Lab Results  Component Value Date   ALT 23 03/20/2022   AST 22 03/20/2022   ALKPHOS 64 03/20/2022   BILITOT 0.7 03/20/2022   Lab Results  Component Value Date   HGBA1C 6.0 (H) 03/20/2022   HGBA1C 5.8 10/23/2021   HGBA1C 5.6 10/08/2020   HGBA1C 5.5 08/11/2019   HGBA1C 5.6 05/25/2019   Lab Results  Component Value Date   INSULIN 30.1 (H) 03/20/2022   Lab Results  Component Value Date   TSH 3.470 03/20/2022   Lab Results  Component Value Date   CHOL 144 03/20/2022   HDL 44 03/20/2022   LDLCALC 64 03/20/2022   LDLDIRECT 68.0 10/23/2021   TRIG 219 (H) 03/20/2022   CHOLHDL 3 10/23/2021   Lab Results  Component Value Date   VD25OH 36.0 03/20/2022   VD25OH 23.62 (L) 04/06/2020   Lab Results  Component Value Date   WBC 6.6 10/23/2021   HGB 14.5 10/23/2021   HCT 43.1 10/23/2021   MCV 89.1 10/23/2021  PLT 179.0 10/23/2021   No results found for: "IRON", "TIBC", "FERRITIN"  Attestation Statements:   Reviewed by clinician on day of visit: allergies, medications, problem list, medical history, surgical history, family history, social history, and previous encounter notes.  I, Dawn Whitmire, FNP-C, am acting as transcriptionist for Dr. Jearld Lesch.  I have reviewed the above documentation for accuracy and completeness, and I agree with the above. Jearld Lesch, DO

## 2022-10-21 ENCOUNTER — Encounter: Payer: Self-pay | Admitting: Bariatrics

## 2022-10-24 ENCOUNTER — Ambulatory Visit: Payer: PRIVATE HEALTH INSURANCE | Admitting: Family Medicine

## 2022-10-24 ENCOUNTER — Encounter: Payer: Self-pay | Admitting: Family Medicine

## 2022-10-24 VITALS — BP 108/70 | HR 71 | Temp 97.8°F | Ht 67.0 in | Wt 256.0 lb

## 2022-10-24 DIAGNOSIS — E7800 Pure hypercholesterolemia, unspecified: Secondary | ICD-10-CM

## 2022-10-24 DIAGNOSIS — R7303 Prediabetes: Secondary | ICD-10-CM

## 2022-10-24 DIAGNOSIS — Z0000 Encounter for general adult medical examination without abnormal findings: Secondary | ICD-10-CM

## 2022-10-24 DIAGNOSIS — E559 Vitamin D deficiency, unspecified: Secondary | ICD-10-CM

## 2022-10-24 DIAGNOSIS — E890 Postprocedural hypothyroidism: Secondary | ICD-10-CM | POA: Diagnosis not present

## 2022-10-24 DIAGNOSIS — I10 Essential (primary) hypertension: Secondary | ICD-10-CM | POA: Diagnosis not present

## 2022-10-24 LAB — HEMOGLOBIN A1C: Hgb A1c MFr Bld: 5.7 % (ref 4.6–6.5)

## 2022-10-24 LAB — COMPREHENSIVE METABOLIC PANEL
ALT: 21 U/L (ref 0–35)
AST: 19 U/L (ref 0–37)
Albumin: 4.4 g/dL (ref 3.5–5.2)
Alkaline Phosphatase: 57 U/L (ref 39–117)
BUN: 16 mg/dL (ref 6–23)
CO2: 29 mEq/L (ref 19–32)
Calcium: 10.1 mg/dL (ref 8.4–10.5)
Chloride: 103 mEq/L (ref 96–112)
Creatinine, Ser: 0.94 mg/dL (ref 0.40–1.20)
GFR: 63.97 mL/min (ref >60.00)
Glucose, Bld: 112 mg/dL — ABNORMAL HIGH (ref 70–99)
Potassium: 3.9 mEq/L (ref 3.5–5.1)
Sodium: 142 mEq/L (ref 135–145)
Total Bilirubin: 1.1 mg/dL (ref 0.2–1.2)
Total Protein: 6.6 g/dL (ref 6.0–8.3)

## 2022-10-24 LAB — VITAMIN D 25 HYDROXY (VIT D DEFICIENCY, FRACTURES): VITD: 33.28 ng/mL (ref 30.00–100.00)

## 2022-10-24 LAB — CBC
HCT: 43.5 % (ref 36.0–46.0)
Hemoglobin: 14.8 g/dL (ref 12.0–15.0)
MCHC: 34 g/dL (ref 30.0–36.0)
MCV: 88.8 fl (ref 78.0–100.0)
Platelets: 206 10*3/uL (ref 150.0–400.0)
RBC: 4.9 Mil/uL (ref 3.87–5.11)
RDW: 13.3 % (ref 11.5–15.5)
WBC: 6.5 10*3/uL (ref 4.0–10.5)

## 2022-10-24 LAB — LIPID PANEL
Cholesterol: 168 mg/dL (ref 0–200)
HDL: 59.3 mg/dL (ref >39.00)
LDL Cholesterol: 87 mg/dL (ref 0–99)
NonHDL: 108.83
Total CHOL/HDL Ratio: 3
Triglycerides: 107 mg/dL (ref 0.0–149.0)
VLDL: 21.4 mg/dL (ref 0.0–40.0)

## 2022-10-24 LAB — TSH: TSH: 5.76 u[IU]/mL — ABNORMAL HIGH (ref 0.35–5.50)

## 2022-10-24 MED ORDER — LEVOTHYROXINE SODIUM 112 MCG PO TABS: ORAL_TABLET | ORAL | 1 refills | Status: DC

## 2022-10-24 NOTE — Progress Notes (Signed)
Office Note 10/24/2022  CC:  Chief Complaint  Patient presents with   Medical Management of Chronic Issues    Pt is fasting    HPI:  Patient is a 67 y.o. female who is here for annual health maintenance exam and 45-monthfollow-up hypertension, hyperlipidemia, and postsurgical hypothyroidism.  She got COVID in December 2023, then had a series of anxious situations prominent in her life during which she did not have very good diet and periodically noticed her blood pressure go up to the [id number]. By enlarge, though, she says her blood pressure has been normal.  She saw her GYN 2 months ago and was having significant anxiety at that time.  She was started on buspirone 5 mg twice a day and she feels like this has helped some.  Past Medical History:  Diagnosis Date   Abdominal pain 2021   CT abd neg. suspect abd wall pain vs functional GI pain   Carpal tunnel syndrome    Cervical spondylosis    Depression    GERD (gastroesophageal reflux disease)    with mild esoph dysmotility and small sliding HH on barium esophagram 06/2022   History of MRSA infection    R hand soft tissue   Hyperlipidemia    Hypertension    Hypothyroidism    IFG (impaired fasting glucose)    A1c's 5.5-5.6% 2019-2022   Joint pain    Migraine syndrome    occasional   Obesity, Class II, BMI 35-39.9    OSA on CPAP 11/2013   severe; needs compliance monitoring->referred to neurologist ([facility])   Osteoarthritis    Knees, ankles, fingers, wrists, sometimes hips.  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    Past Surgical History:  Procedure Laterality Date   APPENDECTOMY  6 yrs ago   Dr. JArnoldo Morale  COLONOSCOPY  2012; 02/20/20   approx 2012, normal. 01/2020 polyp x 1->per Dr. MCollene Maresrecall 5-10 yrs   CYSTOSCOPY     for pain in side; normal   ESOPHAGOGASTRODUODENOSCOPY     This was done for dysphagia/regurgitation.   Esophageal stricture but no dilatation required.-->  Symptoms improved with daily PPI.   FOOT SURGERY Right 2017   dorsal tarsal exostectomy, endoscopic plantar fasciotomy, bunionectomy with screw and removal of toenail 2nd toe R foot.   GANGLION CYST EXCISION     polysomnogram  11/2013   severe OSA   THYROIDECTOMY  12/10/2011   Bx benign.  Enlarging goiter.  Procedure: THYROIDECTOMY;  Surgeon: MJamesetta So MD;  Location: [facility];  Service: General;  Laterality: N/A;  Total Thyroidectomy   TRANSTHORACIC ECHOCARDIOGRAM  05/17/2015   EF 65-70%, mod LVH, normal wall motion, grd I DD, mild MR.    Family History  Problem Relation Age of Onset   Hypertension Mother    Cancer Mother        lung   Migraines Mother    Ulcers Mother    Hypertension Father    Migraines Father    Heart disease Father    Ulcers Father    Early death Sister    Diabetes Sister    Diabetes Paternal Grandmother    Kidney disease Paternal Grandfather        kidney failure   Heart attack Paternal Grandfather    Early death Sister    Obesity Sister    Hypertension Sister    Migraines  Sister    Cancer Sister        ? cervical cancer   Multiple sclerosis Sister    Breast cancer Sister    Pseudochol deficiency Neg Hx    Malignant hyperthermia Neg Hx    Hypotension Neg Hx    Anesthesia problems Neg Hx     Social History   Socioeconomic History   Marital status: Married    Spouse name: Not on file   Number of children: Not on file   Years of education: Not on file   Highest education level: Master's degree (e.g., MA, MS, MEng, MEd, MSW, MBA)  Occupational History   Not on file  Tobacco Use   Smoking status: Never   Smokeless tobacco: Never  Vaping Use   Vaping Use: Never used  Substance and Sexual Activity   Alcohol use: Yes    Comment: very rarely   Drug use: No   Sexual activity: Yes    Birth control/protection: Post-menopausal  Other Topics Concern   Not on file  Social History Narrative    Married, 3 step children.  She raised her nephew who has special needs.   Educ: masters deg   Occup: retired Biomedical engineer   Rare alcohol.   No tob.   Social Determinants of Health   Financial Resource Strain: Low Risk  (09/02/2022)   Overall Financial Resource Strain (CARDIA)    Difficulty of Paying Living Expenses: Not hard at all  Food Insecurity: No Food Insecurity (09/02/2022)   Hunger Vital Sign    Worried About Running Out of Food in the Last Year: Never true    Ran Out of Food in the Last Year: Never true  Transportation Needs: No Transportation Needs (09/02/2022)   PRAPARE - Hydrologist (Medical): No    Lack of Transportation (Non-Medical): No  Physical Activity: Insufficiently Active (09/02/2022)   Exercise Vital Sign    Days of Exercise per Week: 5 days    Minutes of Exercise per Session: 20 min  Stress: Stress Concern Present (09/02/2022)   [place]    Feeling of Stress : To some extent  Social Connections: Socially Integrated (09/02/2022)   Social Connection and Isolation Panel [NHANES]    Frequency of Communication with Friends and Family: Three times a week    Frequency of Social Gatherings with Friends and Family: Twice a week    Attends Religious Services: More than 4 times per year    Active Member of [employer] or Organizations: Yes    Attends Music therapist: More than 4 times per year    Marital Status: Married  Human resources officer Violence: Not At Risk (09/02/2022)   Humiliation, Afraid, Rape, and Kick questionnaire    Fear of Current or Ex-Partner: No    Emotionally Abused: No    Physically Abused: No    Sexually Abused: No    Outpatient Medications Prior to Visit  Medication Sig Dispense Refill   acetaminophen (TYLENOL 8 HOUR) 650 MG CR tablet Take 650 mg by mouth every 8 (eight) hours as needed for pain.     busPIRone (BUSPAR) 5 MG tablet Take 1  tablet (5 mg total) by mouth 2 (two) times daily. 60 tablet 6   eletriptan (RELPAX) 40 MG tablet Take 40 mg by mouth as needed. may repeat in 2 hours if necessary for migraines     losartan-hydrochlorothiazide (HYZAAR) 100-12.5 MG tablet TAKE (1)  TABLET BY MOUTH ONCE DAILY. 90 tablet 3   pantoprazole (PROTONIX) 40 MG tablet Take 40 mg by mouth daily.     rosuvastatin (CRESTOR) 10 MG tablet Take 1 tablet (10 mg total) by mouth daily. 90 tablet 3   Vitamin D, Ergocalciferol, (DRISDOL) 1.25 MG (50000 UNIT) CAPS capsule Take 1 capsule (50,000 Units total) by mouth every 7 (seven) days. 5 capsule 0   levothyroxine (SYNTHROID) 112 MCG tablet TAKE (1) TABLET BY MOUTH EACH MORNING ON AN EMPTY STOMACH. 30 tablet 0   No facility-administered medications prior to visit.    Allergies  Allergen Reactions   Bactrim [Sulfamethoxazole-Trimethoprim] Nausea And Vomiting   Erythromycin Hives    REACTION: rash on trunk of body   Iodinated Contrast Media Swelling and Other (See Comments)    Reaction: itching,swelling around the eyes    Review of Systems  Constitutional:  Negative for appetite change, chills, fatigue and fever.  HENT:  Negative for congestion, dental problem, ear pain and sore throat.   Eyes:  Negative for discharge, redness and visual disturbance.  Respiratory:  Negative for cough, chest tightness, shortness of breath and wheezing.   Cardiovascular:  Negative for chest pain, palpitations and leg swelling.  Gastrointestinal:  Negative for abdominal pain, blood in stool, diarrhea, nausea and vomiting.  Genitourinary:  Negative for difficulty urinating, dysuria, flank pain, frequency, hematuria and urgency.  Musculoskeletal:  Negative for arthralgias, back pain, joint swelling, myalgias and neck stiffness.  Skin:  Negative for pallor and rash.  Neurological:  Negative for dizziness, speech difficulty, weakness and headaches.  Hematological:  Negative for adenopathy. Does not bruise/bleed  easily.  Psychiatric/Behavioral:  Negative for confusion and sleep disturbance. The patient is not nervous/anxious.     PE;    10/24/2022    8:52 AM 10/06/2022    9:00 AM 09/08/2022    9:00 AM  Vitals with BMI  Height '5\' 7"'$  '5\' 7"'$  '5\' 7"'$   Weight 256 lbs 251 lbs 250 lbs  BMI 40.09 [id number]  Systolic 123XX123 [id number] 0000000  Diastolic 70 71 78  Pulse 71 77 71   EXAM chaperoned by female CMA today  Gen: Alert, well appearing.  Patient is oriented to person, place, time, and situation. AFFECT: pleasant, lucid thought and speech. ENT: Ears: EACs clear, normal epithelium.  TMs with good light reflex and landmarks bilaterally.  Eyes: no injection, icteris, swelling, or exudate.  EOMI, PERRLA. Nose: no drainage or turbinate edema/swelling.  No injection or focal lesion.  Mouth: lips without lesion/swelling.  Oral mucosa pink and moist.  Dentition intact and without obvious caries or gingival swelling.  Oropharynx without erythema, exudate, or swelling.  Neck: supple/nontender.  No LAD, mass, or TM.  Carotid pulses 2+ bilaterally, without bruits. CV: RRR, no m/r/g.   LUNGS: CTA bilat, nonlabored resps, good aeration in all lung fields. ABD: soft, NT, ND, BS normal.  No hepatospenomegaly or mass.  No bruits. EXT: no clubbing, cyanosis, or edema.  Musculoskeletal: no joint swelling, erythema, warmth, or tenderness.  ROM of all joints intact. Skin - no sores or suspicious lesions or rashes or color changes   Pertinent labs:  Lab Results  Component Value Date   TSH 3.470 03/20/2022   Lab Results  Component Value Date   WBC 6.6 10/23/2021   HGB 14.5 10/23/2021   HCT 43.1 10/23/2021   MCV 89.1 10/23/2021   PLT 179.0 10/23/2021   Lab Results  Component Value Date   CREATININE 0.77 03/20/2022  BUN 10 03/20/2022   NA 144 03/20/2022   K 3.5 03/20/2022   CL 102 03/20/2022   CO2 22 03/20/2022   Lab Results  Component Value Date   ALT 23 03/20/2022   AST 22 03/20/2022   ALKPHOS 64 03/20/2022    BILITOT 0.7 03/20/2022   Lab Results  Component Value Date   CHOL 144 03/20/2022   Lab Results  Component Value Date   HDL 44 03/20/2022   Lab Results  Component Value Date   LDLCALC 64 03/20/2022   Lab Results  Component Value Date   TRIG 219 (H) 03/20/2022   Lab Results  Component Value Date   CHOLHDL 3 10/23/2021   Lab Results  Component Value Date   HGBA1C 6.0 (H) 03/20/2022   Last vitamin D Lab Results  Component Value Date   VD25OH 36.0 03/20/2022    ASSESSMENT AND PLAN:   #1 health maintenance exam: Reviewed age and gender appropriate health maintenance issues (prudent diet, regular exercise, health risks of tobacco and excessive alcohol, use of seatbelts, fire alarms in home, use of sunscreen).  Also reviewed age and gender appropriate health screening as well as vaccine recommendations. Vaccines: ALL UTD. Labs: fasting HP + Hba1c ordered. Cervical ca screening: per GYN MD Breast ca screening: per GYN MD Colon ca screening: recall 2026  #2 GERD, history of some dysphagia. Much improved with daily PPI.  EGD was reassuring (Dr. Collene Mares). She has appropriate GI follow-up.  3.  Hypertension, well-controlled on losartan-HCTZ 100-12 0.5 once a day.  4.  Hypercholesterolemia, doing well on Crestor 10 mg a day. Lipid panel and hepatic panel today.  #5  Postsurgical hypothyroidism. Continue 112 mcg levothyroxine daily. TSH today.  #6 GAD, recent adjustment disorder with anxious mood. She has had improvement on buspirone 5 mg twice daily per GYN provider.  She has follow-up.  #7 vitamin D deficiency. She is on 48 K weekly vitamin D dosing. Most recent level was about 7 months ago.  Check vitamin D level today.  An After Visit Summary was printed and given to the patient.  FOLLOW UP:  Return in about 6 months (around 04/26/2023) for routine chronic illness f/u.  Signed:  Crissie Sickles, MD           10/24/2022

## 2022-10-24 NOTE — Patient Instructions (Signed)

## 2022-10-28 ENCOUNTER — Ambulatory Visit (INDEPENDENT_AMBULATORY_CARE_PROVIDER_SITE_OTHER): Payer: PRIVATE HEALTH INSURANCE | Admitting: Adult Health

## 2022-10-28 ENCOUNTER — Encounter: Payer: Self-pay | Admitting: Adult Health

## 2022-10-28 VITALS — BP 119/72 | HR 81 | Ht 67.0 in | Wt 257.5 lb

## 2022-10-28 DIAGNOSIS — F419 Anxiety disorder, unspecified: Secondary | ICD-10-CM | POA: Diagnosis not present

## 2022-10-28 NOTE — Progress Notes (Signed)
Subjective:     Patient ID: Theresa Young, female   DOB: February 16, 1956, 67 y.o.   MRN: [id number]  HPI Theresa Young is a 67 year old white female,married, PM back in follow up on starting Buspar and feels better, less anxious, but still waking up at night, but not every night. She saw Dr Anitra Lauth yesterday.     Component Value Date/Time   DIAGPAP  07/25/2020 0918    - Negative for intraepithelial lesion or malignancy (NILM)   DIAGPAP  02/16/2017 0000    NEGATIVE FOR INTRAEPITHELIAL LESIONS OR MALIGNANCY.   [place] Negative 07/25/2020 0918   ADEQPAP  07/25/2020 0918    Satisfactory for evaluation; transformation zone component PRESENT.   ADEQPAP  02/16/2017 0000    Satisfactory for evaluation  endocervical/transformation zone component PRESENT.   PCP is Dr Anitra Lauth  Review of Systems Feeling less anxious Still waking up at night but not every night Reviewed past medical,surgical, social and family history. Reviewed medications and allergies.     Objective:   Physical Exam BP 119/72 (BP Location: Left Arm, Patient Position: Sitting, Cuff Size: Large)   Pulse 81   Ht '5\' 7"'$  (1.702 m)   Wt 257 lb 8 oz (116.8 kg)   BMI 40.33 kg/m     Skin warm and dry.Lungs: clear to ausculation bilaterally. Cardiovascular: regular rate and rhythm.     10/28/2022    9:02 AM 10/24/2022    8:56 AM 09/02/2022    8:39 AM  Depression screen PHQ 2/9  Decreased Interest 0 0 0  Down, Depressed, Hopeless 0 0 0  PHQ - 2 Score 0 0 0  Altered sleeping 1  1  Tired, decreased energy 1  1  Change in appetite 1  0  Feeling bad or failure about yourself  0  0  Trouble concentrating 0  0  Moving slowly or fidgety/restless 0  1  Suicidal thoughts 0  0  PHQ-9 Score 3  3       10/28/2022    9:05 AM 09/02/2022    8:39 AM 07/30/2021    2:43 PM 07/25/2020    9:18 AM  GAD 7 : Generalized Anxiety Score  Nervous, Anxious, on Edge 1 1 0 0  Control/stop worrying 0 1 0 0  Worry too much - different things 0 1 0 0  Trouble  relaxing 0 1 0 0  Restless 0 1 0 0  Easily annoyed or irritable 0 0 0 0  Afraid - awful might happen 1 1 0 1  Total GAD 7 Score 2 6 0 1      Upstream - 10/28/22 0901       Pregnancy Intention Screening   Does the patient want to become pregnant in the next year? N/A    Does the patient's partner want to become pregnant in the next year? N/A    Would the patient like to discuss contraceptive options today? N/A      Contraception Wrap Up   Current Method No Method - Other Reason   postmenopausal   Reason for No Current Contraceptive Method at Intake ([facility] Only) Other    End Method No Method - Other Reason   postmenopausal   Contraception Counseling Provided No             Assessment:     1. Anxiety Is feeling better less anxious Still waking some at night, but not every night Will continue Buspar 5 mg 1 bid, has refills  Follow up in 3 months or sooner if needed     Plan:     Follow up in 3 months

## 2022-11-04 ENCOUNTER — Ambulatory Visit: Payer: PRIVATE HEALTH INSURANCE | Admitting: Bariatrics

## 2022-11-17 ENCOUNTER — Ambulatory Visit: Payer: PRIVATE HEALTH INSURANCE | Admitting: Bariatrics

## 2022-12-07 DIAGNOSIS — J309 Allergic rhinitis, unspecified: Secondary | ICD-10-CM | POA: Diagnosis not present

## 2022-12-07 DIAGNOSIS — Z6841 Body Mass Index (BMI) 40.0 and over, adult: Secondary | ICD-10-CM | POA: Diagnosis not present

## 2022-12-22 ENCOUNTER — Ambulatory Visit: Payer: PRIVATE HEALTH INSURANCE | Admitting: Bariatrics

## 2023-02-02 ENCOUNTER — Encounter: Payer: Self-pay | Admitting: Adult Health

## 2023-02-02 ENCOUNTER — Ambulatory Visit: Payer: Medicare HMO | Admitting: Adult Health

## 2023-02-02 VITALS — BP 138/84 | HR 75 | Ht 67.0 in | Wt 268.0 lb

## 2023-02-02 DIAGNOSIS — F419 Anxiety disorder, unspecified: Secondary | ICD-10-CM

## 2023-02-02 DIAGNOSIS — F439 Reaction to severe stress, unspecified: Secondary | ICD-10-CM

## 2023-02-02 MED ORDER — SERTRALINE HCL 50 MG PO TABS: 50.0000 mg | ORAL_TABLET | Freq: Every day | ORAL | 3 refills | Status: DC

## 2023-02-02 MED ORDER — BUSPIRONE HCL 5 MG PO TABS: 5.0000 mg | ORAL_TABLET | Freq: Two times a day (BID) | ORAL | 6 refills | Status: DC

## 2023-02-02 NOTE — Progress Notes (Signed)
Subjective:     Patient ID: Theresa Young, female   DOB: [birth year], 67 y.o.   MRN: [id number]  HPI Theresa Young is a 67 year old white female, married, PM back in follow up on taking buspar 5 mg 1 bid and is better but nor where she wants to be, still waking up at night. Mind is thinking too much. Has some family stress.     Component Value Date/Time   DIAGPAP  07/25/2020 0918    - Negative for intraepithelial lesion or malignancy (NILM)   DIAGPAP  02/16/2017 0000    NEGATIVE FOR INTRAEPITHELIAL LESIONS OR MALIGNANCY.   [place] Negative 07/25/2020 0918   ADEQPAP  07/25/2020 0918    Satisfactory for evaluation; transformation zone component PRESENT.   ADEQPAP  02/16/2017 0000    Satisfactory for evaluation  endocervical/transformation zone component PRESENT.   PCP is Dr Marvel Plan   Review of Systems +anxiety +family stress Reviewed past medical,surgical, social and family history. Reviewed medications and allergies.     Objective:   Physical Exam BP 138/84 (BP Location: Left Arm, Patient Position: Sitting, Cuff Size: Large)   Pulse 75   Ht 5\' 7"  (1.702 m)   Wt 268 lb (121.6 kg)   BMI 41.97 kg/m  Skin warm and dry.  Lungs: clear to ausculation bilaterally. Cardiovascular: regular rate and rhythm.    Fall risk is moderate    02/02/2023    9:08 AM 10/28/2022    9:02 AM 10/24/2022    8:56 AM  Depression screen PHQ 2/9  Decreased Interest 0 0 0  Down, Depressed, Hopeless 1 0 0  PHQ - 2 Score 1 0 0  Altered sleeping 1 1   Tired, decreased energy 1 1   Change in appetite 1 1   Feeling bad or failure about yourself  0 0   Trouble concentrating 1 0   Moving slowly or fidgety/restless 0 0   Suicidal thoughts 0 0   PHQ-9 Score 5 3        02/02/2023    9:09 AM 10/28/2022    9:05 AM 09/02/2022    8:39 AM 07/30/2021    2:43 PM  GAD 7 : Generalized Anxiety Score  Nervous, Anxious, on Edge 1 1 1  0  Control/stop worrying 0 0 1 0  Worry too much - different things 1 0 1 0  Trouble relaxing  1 0 1 0  Restless 0 0 1 0  Easily annoyed or irritable 1 0 0 0  Afraid - awful might happen 1 1 1  0  Total GAD 7 Score 5 2 6  0      Upstream - 02/02/23 0907       Pregnancy Intention Screening   Does the patient want to become pregnant in the next year? N/A    Does the patient's partner want to become pregnant in the next year? N/A    Would the patient like to discuss contraceptive options today? N/A      Contraception Wrap Up   Current Method No Method - Other Reason   postmenopausal   Reason for No Current Contraceptive Method at Intake ([facility] Only) Other    End Method No Method - Other Reason   postmenopausal   Contraception Counseling Provided No             Assessment:      1. Anxiety Waking up at night Will continue Bupsar 5 mg 1 bid and will add zoloft 50 mg 1 daily  but take 1/2 tab first week Meds ordered this encounter  Medications   sertraline (ZOLOFT) 50 MG tablet    Sig: Take 1 tablet (50 mg total) by mouth daily.    Dispense:  30 tablet    Refill:  3    Order Specific Question:   Supervising Provider    Answer:   Despina Hidden, LUTHER H [[id number]]   busPIRone (BUSPAR) 5 MG tablet    Sig: Take 1 tablet (5 mg total) by mouth 2 (two) times daily.    Dispense:  60 tablet    Refill:  6    Order Specific Question:   Supervising Provider    Answer:   Duane Lope H [[id number]]   Take time for self  2. Stress at home Will think about counseling    Do deep breathing  Walk outside Keep communications open   Plan:     Follow up in 6 weeks with me

## 2023-03-05 ENCOUNTER — Other Ambulatory Visit: Payer: Self-pay | Admitting: Family Medicine

## 2023-03-05 NOTE — Telephone Encounter (Signed)
RF request for losartan-hydrochlorothiazide (HYZAAR) 100-12.5 MG tablet  LOV: 10/24/22 Next ov: 04/29/23 Last written: 04/25/22 (90,3) 1 year supply  Refill requested too early

## 2023-03-16 ENCOUNTER — Encounter: Payer: Self-pay | Admitting: Adult Health

## 2023-03-16 ENCOUNTER — Ambulatory Visit: Payer: Medicare HMO | Admitting: Adult Health

## 2023-03-16 VITALS — BP 139/83 | HR 72 | Ht 67.0 in | Wt 274.0 lb

## 2023-03-16 DIAGNOSIS — F419 Anxiety disorder, unspecified: Secondary | ICD-10-CM | POA: Diagnosis not present

## 2023-03-16 MED ORDER — ESCITALOPRAM OXALATE 10 MG PO TABS: 10.0000 mg | ORAL_TABLET | Freq: Every day | ORAL | 6 refills | Status: DC

## 2023-03-16 NOTE — Progress Notes (Signed)
Subjective:     Patient ID: Theresa Young, female   DOB: 09-13-1955, 67 y.o.   MRN: [id number]  HPI Theresa Young is a  67 year old white female, married, PM back in follow up on taking Buspar and Zoloft, and has diarrhea with zoloft, and is sleeping harder. She is taking Buspar once a day, instead of bid      Component Value Date/Time   DIAGPAP  07/25/2020 0918    - Negative for intraepithelial lesion or malignancy (NILM)   DIAGPAP  02/16/2017 0000    NEGATIVE FOR INTRAEPITHELIAL LESIONS OR MALIGNANCY.   [place] Negative 07/25/2020 0918   ADEQPAP  07/25/2020 0918    Satisfactory for evaluation; transformation zone component PRESENT.   ADEQPAP  02/16/2017 0000    Satisfactory for evaluation  endocervical/transformation zone component PRESENT.    PCP is Dr Marvel Plan    Review of Systems +anxiety Has diarrhea with Zoloft Reviewed past medical,surgical, social and family history. Reviewed medications and allergies.     Objective:   Physical Exam BP 139/83 (BP Location: Right Arm, Patient Position: Sitting, Cuff Size: Large)   Pulse 72   Ht 5\' 7"  (1.702 m)   Wt 274 lb (124.3 kg)   BMI 42.91 kg/m     Skin warm and dry. Lungs: clear to ausculation bilaterally. Cardiovascular: regular rate and rhythm.     03/16/2023   10:24 AM 02/02/2023    9:08 AM 10/28/2022    9:02 AM  Depression screen PHQ 2/9  Decreased Interest 0 0 0  Down, Depressed, Hopeless 1 1 0  PHQ - 2 Score 1 1 0  Altered sleeping 1 1 1   Tired, decreased energy 1 1 1   Change in appetite 0 1 1  Feeling bad or failure about yourself  0 0 0  Trouble concentrating 1 1 0  Moving slowly or fidgety/restless 0 0 0  Suicidal thoughts 0 0 0  PHQ-9 Score 4 5 3        03/16/2023   10:25 AM 02/02/2023    9:09 AM 10/28/2022    9:05 AM 09/02/2022    8:39 AM  GAD 7 : Generalized Anxiety Score  Nervous, Anxious, on Edge 2 1 1 1   Control/stop worrying 1 0 0 1  Worry too much - different things 1 1 0 1  Trouble relaxing 1 1 0 1   Restless 0 0 0 1  Easily annoyed or irritable 1 1 0 0  Afraid - awful might happen 1 1 1 1   Total GAD 7 Score 7 5 2 6       Upstream - 03/16/23 1033       Pregnancy Intention Screening   Does the patient want to become pregnant in the next year? N/A    Does the patient's partner want to become pregnant in the next year? N/A    Would the patient like to discuss contraceptive options today? N/A      Contraception Wrap Up   Current Method No Method - Other Reason   postmenopausal   Reason for No Current Contraceptive Method at Intake ([facility] Only) Other    End Method No Method - Other Reason   postmenopausal   Contraception Counseling Provided No             Assessment:     1. Anxiety Has refills on Buspar, try taking 5 mg in am and 5 mg in afternoon Will stop Zoloft and try lexapro Meds ordered this encounter  Medications  escitalopram (LEXAPRO) 10 MG tablet    Sig: Take 1 tablet (10 mg total) by mouth daily.    Dispense:  30 tablet    Refill:  6    Order Specific Question:   Supervising Provider    Answer:   Lazaro Arms [[id number]]       Plan:     Follow up in 8 weeks or sooner if needed

## 2023-04-09 ENCOUNTER — Encounter (INDEPENDENT_AMBULATORY_CARE_PROVIDER_SITE_OTHER): Payer: Self-pay

## 2023-04-21 DIAGNOSIS — I10 Essential (primary) hypertension: Secondary | ICD-10-CM | POA: Diagnosis not present

## 2023-04-21 DIAGNOSIS — K449 Diaphragmatic hernia without obstruction or gangrene: Secondary | ICD-10-CM | POA: Diagnosis not present

## 2023-04-21 DIAGNOSIS — Z86010 Personal history of colonic polyps: Secondary | ICD-10-CM | POA: Diagnosis not present

## 2023-04-21 DIAGNOSIS — K219 Gastro-esophageal reflux disease without esophagitis: Secondary | ICD-10-CM | POA: Diagnosis not present

## 2023-04-29 ENCOUNTER — Encounter: Payer: Self-pay | Admitting: Family Medicine

## 2023-04-29 ENCOUNTER — Ambulatory Visit: Payer: Medicare HMO | Admitting: Family Medicine

## 2023-04-29 VITALS — BP 124/74 | HR 69 | Temp 98.3°F | Ht 67.0 in | Wt 275.2 lb

## 2023-04-29 DIAGNOSIS — I10 Essential (primary) hypertension: Secondary | ICD-10-CM

## 2023-04-29 DIAGNOSIS — E559 Vitamin D deficiency, unspecified: Secondary | ICD-10-CM

## 2023-04-29 DIAGNOSIS — E890 Postprocedural hypothyroidism: Secondary | ICD-10-CM

## 2023-04-29 DIAGNOSIS — E7800 Pure hypercholesterolemia, unspecified: Secondary | ICD-10-CM | POA: Diagnosis not present

## 2023-04-29 DIAGNOSIS — F411 Generalized anxiety disorder: Secondary | ICD-10-CM

## 2023-04-29 DIAGNOSIS — Z23 Encounter for immunization: Secondary | ICD-10-CM | POA: Diagnosis not present

## 2023-04-29 NOTE — Progress Notes (Signed)
OFFICE VISIT  04/29/2023  CC:  Chief Complaint  Patient presents with   Medical Management of Chronic Issues    Patient is a 67 y.o. female who presents for 81-month follow-up hypertension, hyperlipidemia, and postsurgical hypothyroidism. At the time of last follow-up all of her chronic problems were stable, no medication or other management changes were made.  INTERIM HX: Still with quite bit of anxiety. In talking with her gynecologist they decided to try some medications.  She has found the BuSpar helpful, takes 5 mg every morning. They did a trial of sertraline but this caused too much GI upset.  Additionally, a trial of Lexapro caused the same. She feels okay continuing what she is doing with the BuSpar and is open to counseling.  She denies feeling depressed mood.  Home blood pressures normal.  She has had a couple of elevations when in medical settings since I saw her last.  ROS as above, plus--> no fevers, no CP, no SOB, no wheezing, no cough, no dizziness, no HAs, no rashes, no melena/hematochezia.  No polyuria or polydipsia.  No myalgias or arthralgias.  No focal weakness, paresthesias, or tremors.  No acute vision or hearing abnormalities.  No dysuria or unusual/new urinary urgency or frequency.  No recent changes in lower legs. No n/v/d or abd pain.  No palpitations.    Past Medical History:  Diagnosis Date   Abdominal pain 2021   CT abd neg. suspect abd wall pain vs functional GI pain   Carpal tunnel syndrome    Cervical spondylosis    Depression    GERD (gastroesophageal reflux disease)    with mild esoph dysmotility and small sliding HH on barium esophagram 06/2022   History of MRSA infection    R hand soft tissue   Hyperlipidemia    Hypertension    Hypothyroidism    IFG (impaired fasting glucose)    A1c's 5.5-5.6% 2019-2022   Joint pain    Migraine syndrome    occasional   Obesity, Class II, BMI 35-39.9    OSA on CPAP 11/2013   severe; needs compliance  monitoring->referred to neurologist ([facility])   Osteoarthritis    Knees, ankles, fingers, wrists, sometimes hips.  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    Past Surgical History:  Procedure Laterality Date   APPENDECTOMY  6 yrs ago   Dr. Lovell Sheehan   COLONOSCOPY  2012; 02/20/20   approx 2012, normal. 01/2020 polyp x 1->per Dr. Loreta Ave recall 5-10 yrs   CYSTOSCOPY     for pain in side; normal   ESOPHAGOGASTRODUODENOSCOPY     This was done for dysphagia/regurgitation.  Esophageal stricture but no dilatation required.-->  Symptoms improved with daily PPI.   FOOT SURGERY Right 2017   dorsal tarsal exostectomy, endoscopic plantar fasciotomy, bunionectomy with screw and removal of toenail 2nd toe R foot.   GANGLION CYST EXCISION     polysomnogram  11/2013   severe OSA   THYROIDECTOMY  12/10/2011   Bx benign.  Enlarging goiter.  Procedure: THYROIDECTOMY;  Surgeon: Dalia Heading, MD;  Location: [facility];  Service: General;  Laterality: N/A;  Total Thyroidectomy   TRANSTHORACIC ECHOCARDIOGRAM  05/17/2015   EF 65-70%, mod LVH, normal wall motion, grd I DD, mild MR.   UPPER GI ENDOSCOPY      Outpatient Medications Prior to Visit  Medication Sig Dispense Refill   acetaminophen (  TYLENOL 8 HOUR) 650 MG CR tablet Take 650 mg by mouth every 8 (eight) hours as needed for pain.     busPIRone (BUSPAR) 5 MG tablet Take 1 tablet (5 mg total) by mouth 2 (two) times daily. (Patient taking differently: Take 5 mg by mouth daily.) 60 tablet 6   eletriptan (RELPAX) 40 MG tablet Take 40 mg by mouth as needed. may repeat in 2 hours if necessary for migraines     escitalopram (LEXAPRO) 10 MG tablet Take 1 tablet (10 mg total) by mouth daily. 30 tablet 6   levothyroxine (SYNTHROID) 112 MCG tablet TAKE (1) TABLET BY MOUTH EACH MORNING ON AN EMPTY STOMACH. 90 tablet 1   losartan-hydrochlorothiazide (HYZAAR) 100-12.5 MG tablet TAKE (1)  TABLET BY MOUTH ONCE DAILY. 90 tablet 3   pantoprazole (PROTONIX) 40 MG tablet Take 40 mg by mouth daily.     rosuvastatin (CRESTOR) 10 MG tablet Take 1 tablet (10 mg total) by mouth daily. 90 tablet 3   VITAMIN D PO Take by mouth daily.     No facility-administered medications prior to visit.    Allergies  Allergen Reactions   Bactrim [Sulfamethoxazole-Trimethoprim] Nausea And Vomiting   Erythromycin Hives    REACTION: rash on trunk of body   Iodinated Contrast Media Swelling and Other (See Comments)    Reaction: itching,swelling around the eyes    Review of Systems As per HPI  PE:    04/29/2023    8:25 AM 03/16/2023   10:41 AM 03/16/2023   10:22 AM  Vitals with BMI  Height 5\' 7"   5\' 7"   Weight 275 lbs 3 oz  274 lbs  BMI 43.09  42.9  Systolic  139 148  Diastolic  83 85  Pulse  72 79     Physical Exam  Gen: Alert, well appearing.  Patient is oriented to person, place, time, and situation. AFFECT: Anxious but pleasant, lucid thought and speech. Cardiovascular: Regular rhythm and rate without murmur rub or gallop. Lungs are clear bilaterally, breathing is nonlabored. Extremities show no pitting edema.  LABS:  Last CBC Lab Results  Component Value Date   WBC 6.5 10/24/2022   HGB 14.8 10/24/2022   HCT 43.5 10/24/2022   MCV 88.8 10/24/2022   MCH 30.2 11/18/2018   RDW 13.3 10/24/2022   PLT 206.0 10/24/2022   Last metabolic panel Lab Results  Component Value Date   GLUCOSE 112 (H) 10/24/2022   NA 142 10/24/2022   K 3.9 10/24/2022   CL 103 10/24/2022   CO2 29 10/24/2022   BUN 16 10/24/2022   CREATININE 0.94 10/24/2022   GFR 63.97 10/24/2022   CALCIUM 10.1 10/24/2022   PHOS 3.9 12/11/2011   PROT 6.6 10/24/2022   ALBUMIN 4.4 10/24/2022   LABGLOB 2.0 03/20/2022   AGRATIO 2.3 (H) 03/20/2022   BILITOT 1.1 10/24/2022   ALKPHOS 57 10/24/2022   AST 19 10/24/2022   ALT 21 10/24/2022   ANIONGAP 10 11/18/2018   Last lipids Lab Results  Component Value Date    CHOL 168 10/24/2022   HDL 59.30 10/24/2022   LDLCALC 87 10/24/2022   LDLDIRECT 68.0 10/23/2021   TRIG 107.0 10/24/2022   CHOLHDL 3 10/24/2022   Last hemoglobin A1c Lab Results  Component Value Date   HGBA1C 5.7 10/24/2022   Last thyroid functions Lab Results  Component Value Date   TSH 5.76 (H) 10/24/2022   Last vitamin D Lab Results  Component Value Date   VD25OH 33.28 10/24/2022  IMPRESSION AND PLAN:  #1 hypertension, well-controlled on Hyzaar 100-12.5 mg, 1 tab daily. Monitor electrolytes and creatinine today.  2.  Hypercholesterolemia, doing well on rosuvastatin 10 mg a day. Monitor lipids today.  3.  Generalized anxiety disorder. BuSpar 5 mg every morning is somewhat helpful for her.  She has had intolerable GI side effects from sertraline and Lexapro. No changes today.  Her BuSpar is monitored by her gynecologist. She is going to get some information from her insurer about coverage for counseling and she will get back to me about referral.  4.  Postsurgical hypothyroidism. Has been stable on Synthroid 112 mcg daily. Monitor TSH today.  #5 vitamin D deficiency. Not sure of current supplement dose. Last monitoring 6 months ago was borderline low. Check level today.  An After Visit Summary was printed and given to the patient.  FOLLOW UP: No follow-ups on file.  Signed:  Santiago Bumpers, MD           04/29/2023

## 2023-04-30 LAB — BASIC METABOLIC PANEL
BUN: 13 mg/dL (ref 7–25)
CO2: 27 mmol/L (ref 20–32)
Calcium: 9.9 mg/dL (ref 8.6–10.4)
Chloride: 104 mmol/L (ref 98–110)
Creat: 0.92 mg/dL (ref 0.50–1.05)
Glucose, Bld: 114 mg/dL — ABNORMAL HIGH (ref 65–99)
Potassium: 4 mmol/L (ref 3.5–5.3)
Sodium: 143 mmol/L (ref 135–146)

## 2023-04-30 LAB — TSH: TSH: 6.41 m[IU]/L — ABNORMAL HIGH (ref 0.40–4.50)

## 2023-04-30 LAB — LIPID PANEL
Cholesterol: 163 mg/dL (ref <200)
HDL: 67 mg/dL (ref >=50)
LDL Cholesterol (Calc): 70 mg/dL
Non-HDL Cholesterol (Calc): 96 mg/dL (ref <130)
Total CHOL/HDL Ratio: 2.4 (calc) (ref <5.0)
Triglycerides: 182 mg/dL — ABNORMAL HIGH (ref <150)

## 2023-04-30 LAB — VITAMIN D 25 HYDROXY (VIT D DEFICIENCY, FRACTURES): Vit D, 25-Hydroxy: 46 ng/mL (ref 30–100)

## 2023-04-30 NOTE — Progress Notes (Signed)
Added & faxed

## 2023-05-01 ENCOUNTER — Other Ambulatory Visit: Payer: Self-pay | Admitting: Family Medicine

## 2023-05-02 ENCOUNTER — Encounter: Payer: Self-pay | Admitting: Family Medicine

## 2023-05-04 NOTE — Telephone Encounter (Signed)
Hi Theresa Young, Actually your TSH being elevated signals that your thyroid hormone level is a little too low.   If anything, we would INCREASE your levothyroxine dose a little bit. However, the abnormality is very small and I do not think it is clinically significant.  I recommend you keep your dose the same for now. Hope this helps.  The remainder of your labs were good.  Just mild sugar elevation.  We'll repeat your hemoglobin A1c at next visit. --PM

## 2023-05-11 ENCOUNTER — Other Ambulatory Visit: Payer: Self-pay | Admitting: Adult Health

## 2023-05-11 ENCOUNTER — Ambulatory Visit: Payer: Medicare HMO | Admitting: Adult Health

## 2023-05-11 ENCOUNTER — Encounter: Payer: Self-pay | Admitting: Adult Health

## 2023-05-11 VITALS — BP 138/79 | HR 70 | Ht 67.0 in | Wt 277.5 lb

## 2023-05-11 DIAGNOSIS — E039 Hypothyroidism, unspecified: Secondary | ICD-10-CM

## 2023-05-11 DIAGNOSIS — M79641 Pain in right hand: Secondary | ICD-10-CM | POA: Insufficient documentation

## 2023-05-11 DIAGNOSIS — M79642 Pain in left hand: Secondary | ICD-10-CM | POA: Insufficient documentation

## 2023-05-11 DIAGNOSIS — F419 Anxiety disorder, unspecified: Secondary | ICD-10-CM

## 2023-05-11 NOTE — Progress Notes (Signed)
Subjective:     Patient ID: Theresa Young, female   DOB: 04-17-1956, 67 y.o.   MRN: [id number]  HPI Theresa Young is a 67 year old white female, married, PM back in follow up on anxiety, stopped lexapro due to stomach upset. Has less stress but still anxious. Hands ache, burn and tingle esp at night.  Feels sluggish, had labs 04/29/23 with PCP. TSH was 6.41.      Component Value Date/Time   DIAGPAP  07/25/2020 0918    - Negative for intraepithelial lesion or malignancy (NILM)   DIAGPAP  02/16/2017 0000    NEGATIVE FOR INTRAEPITHELIAL LESIONS OR MALIGNANCY.   HPVHIGH Negative 07/25/2020 0918   ADEQPAP  07/25/2020 0918    Satisfactory for evaluation; transformation zone component PRESENT.   ADEQPAP  02/16/2017 0000    Satisfactory for evaluation  endocervical/transformation zone component PRESENT.   PCP Dr Marvel Plan  Review of Systems Less stress Still anxious +sluggish  Hands ache  Reviewed past medical,surgical, social and family history. Reviewed medications and allergies.     Objective:   Physical Exam BP 138/79 (BP Location: Left Arm, Patient Position: Sitting, Cuff Size: Large)   Pulse 70   Ht 5\' 7"  (1.702 m)   Wt 277 lb 8 oz (125.9 kg)   BMI 43.46 kg/m     Skin warm and dry. Lungs: clear to ausculation bilaterally. Cardiovascular: regular rate and rhythm.  Fall risk is low    05/11/2023    9:42 AM 04/29/2023    8:32 AM 03/16/2023   10:24 AM  Depression screen PHQ 2/9  Decreased Interest 0 0 0  Down, Depressed, Hopeless 1 0 1  PHQ - 2 Score 1 0 1  Altered sleeping 2 2 1   Tired, decreased energy 2 2 1   Change in appetite 0 1 0  Feeling bad or failure about yourself  0 0 0  Trouble concentrating 1 1 1   Moving slowly or fidgety/restless 1 0 0  Suicidal thoughts 0 0 0  PHQ-9 Score 7 6 4   Difficult doing work/chores  Not difficult at all        05/11/2023    9:44 AM 04/29/2023    8:33 AM 03/16/2023   10:25 AM 02/02/2023    9:09 AM  GAD 7 : Generalized Anxiety Score   Nervous, Anxious, on Edge 1 1 2 1   Control/stop worrying 0 1 1 0  Worry too much - different things 1 1 1 1   Trouble relaxing 0 1 1 1   Restless 0 1 0 0  Easily annoyed or irritable 1 1 1 1   Afraid - awful might happen 1 1 1 1   Total GAD 7 Score 4 7 7 5   Anxiety Difficulty  Not difficult at all      Upstream - 05/11/23 0948       Pregnancy Intention Screening   Does the patient want to become pregnant in the next year? N/A    Does the patient's partner want to become pregnant in the next year? N/A    Would the patient like to discuss contraceptive options today? N/A      Contraception Wrap Up   Current Method No Method - Other Reason   PM   Reason for No Current Contraceptive Method at Intake ([facility] Only) Other    End Method No Method - Other Reason   PM   Contraception Counseling Provided No  Assessment:       1. Anxiety Less stress, still anxious and taking buspar 5 mg, has refills, can take tid if needed Stopped lexapro due to stomach upset She is considering counseling  2. Hypothyroidism, unspecified type On synthroid 112 mcg  Will check TSH and free T4 - TSH + free T4  3. Pain in both hands Hands ache, burn and tingle at night Check ANA and ESR  - Antinuclear Antib (ANA) - Sedimentation rate  Plan:     Follow up in January for pap and physical

## 2023-05-12 LAB — TSH+FREE T4
Free T4: 1.32 ng/dL (ref 0.82–1.77)
TSH: 6.07 u[IU]/mL — ABNORMAL HIGH (ref 0.450–4.500)

## 2023-05-12 LAB — ANA: Anti Nuclear Antibody (ANA): POSITIVE — AB

## 2023-05-12 LAB — SEDIMENTATION RATE: Sed Rate: 3 mm/hr (ref 0–40)

## 2023-05-13 DIAGNOSIS — Z008 Encounter for other general examination: Secondary | ICD-10-CM | POA: Diagnosis not present

## 2023-05-18 ENCOUNTER — Other Ambulatory Visit: Payer: Self-pay | Admitting: Adult Health

## 2023-05-18 DIAGNOSIS — R768 Other specified abnormal immunological findings in serum: Secondary | ICD-10-CM

## 2023-05-18 DIAGNOSIS — M79641 Pain in right hand: Secondary | ICD-10-CM

## 2023-05-28 DIAGNOSIS — Z1231 Encounter for screening mammogram for malignant neoplasm of breast: Secondary | ICD-10-CM | POA: Diagnosis not present

## 2023-05-28 LAB — HM MAMMOGRAPHY

## 2023-06-05 ENCOUNTER — Telehealth: Payer: Self-pay | Admitting: Adult Health

## 2023-06-05 NOTE — Telephone Encounter (Signed)
Left message mammogram was good, repeat in 1 year

## 2023-06-10 ENCOUNTER — Other Ambulatory Visit: Payer: Self-pay | Admitting: Family Medicine

## 2023-06-11 DIAGNOSIS — G43019 Migraine without aura, intractable, without status migrainosus: Secondary | ICD-10-CM | POA: Diagnosis not present

## 2023-06-12 ENCOUNTER — Encounter: Payer: Self-pay | Admitting: Family Medicine

## 2023-06-12 DIAGNOSIS — G4733 Obstructive sleep apnea (adult) (pediatric): Secondary | ICD-10-CM | POA: Diagnosis not present

## 2023-06-18 ENCOUNTER — Ambulatory Visit (INDEPENDENT_AMBULATORY_CARE_PROVIDER_SITE_OTHER): Payer: Medicare HMO | Admitting: Family Medicine

## 2023-06-18 ENCOUNTER — Encounter: Payer: Self-pay | Admitting: Family Medicine

## 2023-06-18 VITALS — BP 122/74 | HR 86 | Wt 275.8 lb

## 2023-06-18 DIAGNOSIS — E890 Postprocedural hypothyroidism: Secondary | ICD-10-CM | POA: Diagnosis not present

## 2023-06-18 DIAGNOSIS — R635 Abnormal weight gain: Secondary | ICD-10-CM

## 2023-06-18 DIAGNOSIS — R7301 Impaired fasting glucose: Secondary | ICD-10-CM

## 2023-06-18 DIAGNOSIS — M2550 Pain in unspecified joint: Secondary | ICD-10-CM

## 2023-06-18 NOTE — Progress Notes (Signed)
PATIENT: Theresa Young DOB: Mar 10, 1956  REASON FOR VISIT: follow up HISTORY FROM: patient  Chief Complaint  Patient presents with   Room 2    Pt is here Alone. Pt states that she has trouble staying asleep at night. Pt states that other than that she has been doing great with her CPAP Machine.      HISTORY OF PRESENT ILLNESS:  06/22/23 ALL:  [name] returns for follow up for OSA on CPAP. She is doing well. She is using CPAP nightly for about 7-8 hours, on average. She does not sleep without her machine. She denies concerns with machine or supplies. She has had some trouble with weight gaine, anxiety and insomnia, recently. PCP recent updated blood work. TSH slightly increased. She has follow up soon to discuss. She does report having a headache, today. BP is usually well managed at home.     06/12/2022 ALL: [name] returns for follow up for OSA on CPAP. She continues to well. She is using CPAP every night for about 7-8 hours. She reports really bad sore throat if she does not use CPAP. She denies concerns with supplies or machine.     06/11/2021 ALL: [name] is a 67 y.o. female here today for follow up for OSA on CPAP.  She was previously on CPAP and needed new machine. HST 09/17/2020 confirmed severe OSA with total AHI of 72.3/hr and O2 nadir of 82%. AutoPAP ordered. She is doing fairly well on her new machine. She does not like the heated humidity. She would prefer to have heat turned off. She is not sure about the different settings or how to adjust humidity setting on her new machine. She also reports hearing a clicking noise every night as her machine is ramping. She denies concerns with supplies. She is resting well. She can not sleep without her machine.   Compliance report dated 05/12/2021-06/10/2021 shows that she used CPAP 30/30 days for greater than 4 hours. Average usage was 7hr . Residual AHI was 2 on 7-13cmH20. Pressure in the 95th percentile of 12.8. NO  significant leak noted.   HISTORY: (copied from Dr Teofilo Pod previous note)  Dear Dr. Milinda Cave,    I saw your patient, [name], upon your kind request in my sleep clinic today for initial consultation of her sleep disorder, in particular, evaluation of her prior diagnosis of obstructive sleep apnea.  The patient is unaccompanied today.  As you know, [name] is a 67 year old right-handed woman with an underlying medical history of hypertension, hyperlipidemia, hypothyroidism, vitamin D deficiency, osteoarthritis, migraines (followed by Dr. Neale Burly), carpal tunnel syndrome, reflux disease, depression, cervical spondylosis, and severe obesity with a BMI of over 40, who was previously diagnosed with obstructive sleep apnea and placed on positive airway pressure treatment.  I reviewed your office note from 07/13/2020.  I was able to review her sleep study from 12/19/2013.  Study was interpreted by Dr. Gerilyn Pilgrim time.  She probably had a split-night sleep study.  Baseline AHI was reportedly 51/h, O2 nadir 79% with a baseline oxygen saturation of 97%.  She was noted to have frequent PVCs.  She noted was noted to respond well to CPAP at 10 cm. Her Epworth sleepiness score is 12 out of 24, fatigue severity score 25 out of 63.  She lives with her husband and they are raising a great nephew, age 37.  She takes him to school.  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  Bedtime is generally between 930 and 10 and rise time around 630, latest by 6:45 AM.  She denies recurrent morning headaches but does have nocturia about once per average night.  She suspects that her father had sleep apnea, he died in his 15s from heart disease.  She uses a Primary school teacher medium size full facemask, sometimes she notices the leak.  I was able to review her CPAP compliance data from her machine.  She has a S9 escape from [employer].  Pressure at 10 cm, leak and residual AHI  information is unfortunately not available.  From 07/28/2020 through 08/26/2020 she has used her machine every night with percent use days greater than 4 hours at 100%, indicating superb compliance, average usage of 7 hours and 52 minutes.  He does have some difficulty maintaining sleep at times.  She does not take anything for sleep.  Some years ago she tried melatonin but had vivid dreams.  She follows with Dr. Neale Burly once a year.  She was able to wean off of imipramine for migraine prevention and uses eletriptan infrequently for acute migraines.  She has worked on weight loss.  She is working with a program through her insurance. She drinks caffeine in the form of soda or tea, about 2 servings per day on average.  She drinks alcohol occasionally.  She is a non-smoker.  They have no indoor pets, they have outdoor cats.   REVIEW OF SYSTEMS: Out of a complete 14 system review of symptoms, the patient complains only of the following symptoms, weight gain, fatigue, anxiety and all other reviewed systems are negative.  ESS: 12/24, previously 9/24  ALLERGIES: Allergies  Allergen Reactions   Bactrim [Sulfamethoxazole-Trimethoprim] Nausea And Vomiting   Erythromycin Hives    REACTION: rash on trunk of body   Iodinated Contrast Media Swelling and Other (See Comments)    Reaction: itching,swelling around the eyes    HOME MEDICATIONS: Outpatient Medications Prior to Visit  Medication Sig Dispense Refill   acetaminophen (TYLENOL 8 HOUR) 650 MG CR tablet Take 650 mg by mouth every 8 (eight) hours as needed for pain.     busPIRone (BUSPAR) 5 MG tablet Take 1 tablet (5 mg total) by mouth 2 (two) times daily. (Patient taking differently: Take 5 mg by mouth daily.) 60 tablet 6   cephALEXin (KEFLEX) 500 MG capsule Take 1 capsule (500 mg total) by mouth 2 (two) times daily for 7 days. 14 capsule 0   eletriptan (RELPAX) 40 MG tablet Take 40 mg by mouth as needed. may repeat in 2 hours if necessary for migraines      fluconazole (DIFLUCAN) 150 MG tablet Take 1 tablet (150 mg total) by mouth every 3 (three) days. 2 tablet 0   levothyroxine (SYNTHROID) 112 MCG tablet TAKE (1) TABLET BY MOUTH EACH MORNING ON AN EMPTY STOMACH. 90 tablet 1   losartan-hydrochlorothiazide (HYZAAR) 100-12.5 MG tablet TAKE (1) TABLET BY MOUTH ONCE DAILY. 90 tablet 3   pantoprazole (PROTONIX) 40 MG tablet Take 40 mg by mouth daily.     rosuvastatin (CRESTOR) 10 MG tablet TAKE (1) TABLET BY MOUTH ONCE DAILY. 90 tablet 1   VITAMIN D PO Take by mouth daily.     No facility-administered medications prior to visit.    PAST MEDICAL HISTORY: Past Medical History:  Diagnosis Date   Abdominal pain 2021   CT abd neg. suspect abd wall pain vs functional GI pain   Carpal  tunnel syndrome    Cervical spondylosis    Depression    GERD (gastroesophageal reflux disease)    with mild esoph dysmotility and small sliding HH on barium esophagram 06/2022   History of MRSA infection    R hand soft tissue   Hyperlipidemia    Hypertension    Hypothyroidism    IFG (impaired fasting glucose)    A1c's 5.5-5.6% 2019-2022   Joint pain    Migraine syndrome    occasional   Obesity, Class II, BMI 35-39.9    OSA on CPAP 11/2013   severe; needs compliance monitoring->referred to neurologist ([facility])   Osteoarthritis    Knees, ankles, fingers, wrists, sometimes hips.  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    PAST SURGICAL HISTORY: Past Surgical History:  Procedure Laterality Date   APPENDECTOMY  6 yrs ago   Dr. Lovell Sheehan   COLONOSCOPY  2012; 02/20/20   approx 2012, normal. 01/2020 polyp x 1->per Dr. Loreta Ave recall 5-10 yrs   CYSTOSCOPY     for pain in side; normal   ESOPHAGOGASTRODUODENOSCOPY     This was done for dysphagia/regurgitation.  Esophageal stricture but no dilatation required.-->  Symptoms improved with daily PPI.   FOOT SURGERY Right 2017    dorsal tarsal exostectomy, endoscopic plantar fasciotomy, bunionectomy with screw and removal of toenail 2nd toe R foot.   GANGLION CYST EXCISION     polysomnogram  11/2013   severe OSA   THYROIDECTOMY  12/10/2011   Bx benign.  Enlarging goiter.  Procedure: THYROIDECTOMY;  Surgeon: Dalia Heading, MD;  Location: [facility];  Service: General;  Laterality: N/A;  Total Thyroidectomy   TRANSTHORACIC ECHOCARDIOGRAM  05/17/2015   EF 65-70%, mod LVH, normal wall motion, grd I DD, mild MR.   UPPER GI ENDOSCOPY      FAMILY HISTORY: Family History  Problem Relation Age of Onset   Hypertension Mother    Cancer Mother        lung   Migraines Mother    Ulcers Mother    Hypertension Father    Migraines Father    Heart disease Father    Ulcers Father    Early death Sister    Diabetes Sister    Diabetes Paternal Grandmother    Kidney disease Paternal Grandfather        kidney failure   Heart attack Paternal Grandfather    Early death Sister    Obesity Sister    Hypertension Sister    Migraines Sister    Cancer Sister        ? cervical cancer   Multiple sclerosis Sister    Breast cancer Sister    Pseudochol deficiency Neg Hx    Malignant hyperthermia Neg Hx    Hypotension Neg Hx    Anesthesia problems Neg Hx     SOCIAL HISTORY: Social History   Socioeconomic History   Marital status: Married    Spouse name: Not on file   Number of children: Not on file   Years of education: Not on file   Highest education level: Master's degree (e.g., MA, MS, MEng, MEd, MSW, MBA)  Occupational History   Not on file  Tobacco Use   Smoking status: Never   Smokeless tobacco: Never  Vaping Use   Vaping status: Never Used  Substance and Sexual Activity   Alcohol use: Not Currently  Comment: very rarely   Drug use: No   Sexual activity: Yes    Birth control/protection: Post-menopausal  Other Topics Concern   Not on file  Social History Narrative   Married, 3 step children.  She raised her  nephew who has special needs.   Educ: masters deg   Occup: retired Psychologist, prison and probation services   Rare alcohol.   No tob.   Social Determinants of Health   Financial Resource Strain: Low Risk  (09/02/2022)   Overall Financial Resource Strain (CARDIA)    Difficulty of Paying Living Expenses: Not hard at all  Food Insecurity: No Food Insecurity (09/02/2022)   Hunger Vital Sign    Worried About Running Out of Food in the Last Year: Never true    Ran Out of Food in the Last Year: Never true  Transportation Needs: No Transportation Needs (09/02/2022)   PRAPARE - Administrator, Civil Service (Medical): No    Lack of Transportation (Non-Medical): No  Physical Activity: Insufficiently Active (09/02/2022)   Exercise Vital Sign    Days of Exercise per Week: 5 days    Minutes of Exercise per Session: 20 min  Stress: Stress Concern Present (09/02/2022)   [employer] of Occupational Health - Occupational Stress Questionnaire    Feeling of Stress : To some extent  Social Connections: Socially Integrated (09/02/2022)   Social Connection and Isolation Panel [NHANES]    Frequency of Communication with Friends and Family: Three times a week    Frequency of Social Gatherings with Friends and Family: Twice a week    Attends Religious Services: More than 4 times per year    Active Member of [employer] or Organizations: Yes    Attends Banker Meetings: More than 4 times per year    Marital Status: Married  Catering manager Violence: Not At Risk (09/02/2022)   Humiliation, Afraid, Rape, and Kick questionnaire    Fear of Current or Ex-Partner: No    Emotionally Abused: No    Physically Abused: No    Sexually Abused: No     PHYSICAL EXAM  Vitals:   06/22/23 1054 06/22/23 1113  BP: (!) 157/109 (!) 160/92  Pulse: 79   Weight: 272 lb 8 oz (123.6 kg)   Height: 5\' 7"  (1.702 m)      Body mass index is 42.68 kg/m.  Generalized: Well developed, in no acute distress  Cardiology:  normal rate and rhythm, no murmur noted Respiratory: clear to auscultation bilaterally  Neurological examination  Mentation: Alert oriented to time, place, history taking. Follows all commands speech and language fluent Cranial nerve II-XII: Pupils were equal round reactive to light. Extraocular movements were full, visual field were full  Motor: The motor testing reveals 5 over 5 strength of all 4 extremities. Good symmetric motor tone is noted throughout.  Gait and station: Gait is normal.    DIAGNOSTIC DATA (LABS, IMAGING, TESTING) - I reviewed patient records, labs, notes, testing and imaging myself where available.      No data to display           Lab Results  Component Value Date   WBC 7.1 06/18/2023   HGB 14.2 06/18/2023   HCT 42.2 06/18/2023   MCV 91.1 06/18/2023   PLT 188 06/18/2023      Component Value Date/Time   NA 141 06/18/2023 1339   NA 144 03/20/2022 1008   K 3.5 06/18/2023 1339   CL 105 06/18/2023 1339   CO2 21  06/18/2023 1339   GLUCOSE 133 (H) 06/18/2023 1339   BUN 11 06/18/2023 1339   BUN 10 03/20/2022 1008   CREATININE 0.92 06/18/2023 1339   CALCIUM 9.5 06/18/2023 1339   PROT 6.5 06/18/2023 1339   PROT 6.6 03/20/2022 1008   ALBUMIN 4.4 10/24/2022 0941   ALBUMIN 4.6 03/20/2022 1008   AST 23 06/18/2023 1339   ALT 27 06/18/2023 1339   ALKPHOS 57 10/24/2022 0941   BILITOT 1.0 06/18/2023 1339   BILITOT 0.7 03/20/2022 1008   GFRNONAA >60 11/18/2018 1801   GFRAA >60 11/18/2018 1801   Lab Results  Component Value Date   CHOL 163 04/29/2023   HDL 67 04/29/2023   LDLCALC 70 04/29/2023   LDLDIRECT 68.0 10/23/2021   TRIG 182 (H) 04/29/2023   CHOLHDL 2.4 04/29/2023   Lab Results  Component Value Date   HGBA1C 6.0 (H) 06/18/2023   No results found for: "VITAMINB12" Lab Results  Component Value Date   TSH 4.55 (H) 06/18/2023   TSH 4.70 (H) 06/18/2023     ASSESSMENT AND PLAN 67 y.o. year old female  has a past medical history of Abdominal  pain (2021), Carpal tunnel syndrome, Cervical spondylosis, Depression, GERD (gastroesophageal reflux disease), History of MRSA infection, Hyperlipidemia, Hypertension, Hypothyroidism, IFG (impaired fasting glucose), Joint pain, Migraine syndrome, Obesity, Class II, BMI 35-39.9, OSA on CPAP (11/2013), Osteoarthritis, Postsurgical hypothyroidism (12/21/2014), Pre-diabetes, and Vitamin D deficiency (04/2019). here with     ICD-10-CM   1. OSA on CPAP  G47.33 For home use only DME continuous positive airway pressure (CPAP)       ETHELENE STEKETEE is doing well on CPAP therapy. Compliance report reveals excellent compliance. She was encouraged to continue using CPAP nightly and for greater than 4 hours each night. Risks of untreated sleep apnea review and education materials provided. She was encouraged to keep a close eye on BP. Follow up closely with PCP for abnormal blood work. I encouraged her to treat headache when she gets home. Healthy lifestyle habits encouraged. She will follow up in 1 year, sooner if needed. She verbalizes understanding and agreement with this plan.    Orders Placed This Encounter  Procedures   For home use only DME continuous positive airway pressure (CPAP)    Supplies    Order Specific Question:   Length of Need    Answer:   Lifetime    Order Specific Question:   Patient has OSA or probable OSA    Answer:   Yes    Order Specific Question:   Is the patient currently using CPAP in the home    Answer:   Yes    Order Specific Question:   Settings    Answer:   Other see comments    Order Specific Question:   CPAP supplies needed    Answer:   Mask, headgear, cushions, filters, heated tubing and water chamber     No orders of the defined types were placed in this encounter.      Shawnie Dapper, FNP-C 06/22/2023, 11:13 AM [facility] [address] [place], [place] [address] [phone]

## 2023-06-18 NOTE — Progress Notes (Signed)
OFFICE VISIT  06/18/2023  CC:  Chief Complaint  Patient presents with   Hypothyroidism    Pt concerned thyroid is possibly causing weight gain. Pt also mention anxiety that has become more noticeable in the last few months.     Patient is a 67 y.o. female who presents for 6-week follow-up hypothyroidism.  INTERIM HX: On 04/29/2019 for her TSH was 6.41.  I recommended to not change her levothyroxine dose at that time. Additionally, her glucose was 114.  We were unable to add a hemoglobin A1c at that time.  Theresa Young is having significant ongoing concern about abnormal weight gain. Weight is up about 25 pounds over the last year despite changing nothing with her diet or activity level. She is not able to do much cardiovascular exercise due to musculoskeletal pain. Describes high anxiety levels. She can initiate sleep well but has significant problem with waking up around 1 to 4 AM and not being able to go back to sleep, says her mind is continuously going over lists, things she has to do, etc. She has some depressed mood that is somewhat secondary to all her anxiety and physical symptoms.  She describes stable financial and family circumstances and says she has nothing to cause any excess anxiety and nothing to cause depression at this time. She is hesitant to endorse depressed mood or hopelessness. 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 She has obstructive sleep apnea and she wears CPAP every single night.  She has a longstanding problem with pain in her wrists, fingers, ankles, toes, and knees. Denies any swelling or redness of her joints. No muscle aches. No back or neck pain. At her gynecologist about 5 weeks ago her ANA was positive, no titer was done. Sed rate was 3. She has a consult set up with a rheumatologist in February 2025.  ROS as above, plus--> no fevers, no CP, no SOB, no wheezing, no cough, no dizziness,  no HAs, no rashes, no melena/hematochezia.  No polyuria or polydipsia.   No focal weakness, paresthesias, or tremors.  No acute vision or hearing abnormalities.  No dysuria or unusual/new urinary urgency or frequency.  No recent changes in lower legs. No n/v/d or abd pain.  No palpitations.     Past Medical History:  Diagnosis Date   Abdominal pain 2021   CT abd neg. suspect abd wall pain vs functional GI pain   Carpal tunnel syndrome    Cervical spondylosis    Depression    GERD (gastroesophageal reflux disease)    with mild esoph dysmotility and small sliding HH on barium esophagram 06/2022   History of MRSA infection    R hand soft tissue   Hyperlipidemia    Hypertension    Hypothyroidism    IFG (impaired fasting glucose)    A1c's 5.5-5.6% 2019-2022   Joint pain    Migraine syndrome    occasional   Obesity, Class II, BMI 35-39.9    OSA on CPAP 11/2013   severe; needs compliance monitoring->referred to neurologist ([facility])   Osteoarthritis    Knees, ankles, fingers, wrists, sometimes hips.  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    Past Surgical History:  Procedure Laterality Date   APPENDECTOMY  6 yrs ago   Dr. Lovell Sheehan   COLONOSCOPY  2012; 02/20/20  approx 2012, normal. 01/2020 polyp x 1->per Dr. Loreta Ave recall 5-10 yrs   CYSTOSCOPY     for pain in side; normal   ESOPHAGOGASTRODUODENOSCOPY     This was done for dysphagia/regurgitation.  Esophageal stricture but no dilatation required.-->  Symptoms improved with daily PPI.   FOOT SURGERY Right 2017   dorsal tarsal exostectomy, endoscopic plantar fasciotomy, bunionectomy with screw and removal of toenail 2nd toe R foot.   GANGLION CYST EXCISION     polysomnogram  11/2013   severe OSA   THYROIDECTOMY  12/10/2011   Bx benign.  Enlarging goiter.  Procedure: THYROIDECTOMY;  Surgeon: Dalia Heading, MD;  Location: [facility];  Service: General;   Laterality: N/A;  Total Thyroidectomy   TRANSTHORACIC ECHOCARDIOGRAM  05/17/2015   EF 65-70%, mod LVH, normal wall motion, grd I DD, mild MR.   UPPER GI ENDOSCOPY      Outpatient Medications Prior to Visit  Medication Sig Dispense Refill   acetaminophen (TYLENOL 8 HOUR) 650 MG CR tablet Take 650 mg by mouth every 8 (eight) hours as needed for pain.     busPIRone (BUSPAR) 5 MG tablet Take 1 tablet (5 mg total) by mouth 2 (two) times daily. (Patient taking differently: Take 5 mg by mouth daily.) 60 tablet 6   eletriptan (RELPAX) 40 MG tablet Take 40 mg by mouth as needed. may repeat in 2 hours if necessary for migraines     levothyroxine (SYNTHROID) 112 MCG tablet TAKE (1) TABLET BY MOUTH EACH MORNING ON AN EMPTY STOMACH. 90 tablet 1   losartan-hydrochlorothiazide (HYZAAR) 100-12.5 MG tablet TAKE (1) TABLET BY MOUTH ONCE DAILY. 90 tablet 3   pantoprazole (PROTONIX) 40 MG tablet Take 40 mg by mouth daily.     rosuvastatin (CRESTOR) 10 MG tablet TAKE (1) TABLET BY MOUTH ONCE DAILY. 90 tablet 1   VITAMIN D PO Take by mouth daily.     No facility-administered medications prior to visit.    Allergies  Allergen Reactions   Bactrim [Sulfamethoxazole-Trimethoprim] Nausea And Vomiting   Erythromycin Hives    REACTION: rash on trunk of body   Iodinated Contrast Media Swelling and Other (See Comments)    Reaction: itching,swelling around the eyes    Review of Systems As per HPI  PE:    06/18/2023    1:09 PM 05/11/2023    9:40 AM 04/29/2023    8:25 AM  Vitals with BMI  Height  5\' 7"  5\' 7"   Weight 275 lbs 13 oz 277 lbs 8 oz 275 lbs 3 oz  BMI  43.45 43.09  Systolic 122 138 409  Diastolic 74 79 74  Pulse 86 70 69     Physical Exam  Gen: Alert, well appearing.  Patient is oriented to person, place, time, and situation. Anxious.  Occ tearful when talking.  Lucid thought and speech. CV: RRR, no m/r/g Chest is clear, no wheezing or rales. Normal symmetric air entry throughout both lung  fields. No chest wall deformities or tenderness. EXT: no clubbing or cyanosis.  1+ bilat LL pitting edema.  Skin: No rash. Musculoskeletal: no joint swelling, erythema, warmth, or tenderness.  ROM of all joints intact.   LABS:  Last metabolic panel Lab Results  Component Value Date   GLUCOSE 114 (H) 04/29/2023   NA 143 04/29/2023   K 4.0 04/29/2023   CL 104 04/29/2023   CO2 27 04/29/2023   BUN 13 04/29/2023   CREATININE 0.92 04/29/2023   GFR 63.97 10/24/2022  CALCIUM 9.9 04/29/2023   PHOS 3.9 12/11/2011   PROT 6.6 10/24/2022   ALBUMIN 4.4 10/24/2022   LABGLOB 2.0 03/20/2022   AGRATIO 2.3 (H) 03/20/2022   BILITOT 1.1 10/24/2022   ALKPHOS 57 10/24/2022   AST 19 10/24/2022   ALT 21 10/24/2022   ANIONGAP 10 11/18/2018   Lab Results  Component Value Date   WBC 6.5 10/24/2022   HGB 14.8 10/24/2022   HCT 43.5 10/24/2022   MCV 88.8 10/24/2022   PLT 206.0 10/24/2022   Last thyroid functions Lab Results  Component Value Date   TSH 6.070 (H) 05/11/2023   Lab Results  Component Value Date   HGBA1C 5.7 10/24/2022   Last vitamin D Lab Results  Component Value Date   VD25OH 46 04/29/2023   IMPRESSION AND PLAN:  #1 abnormal weight gain. Check cortisol, insulin and C-peptide, hemoglobin A1c, CBC, c-Met, and TSH.  #2 polyarthralgia. Recent positive ANA, no titer was done. We will do a complete ANA panel today.  #3 hypothyroidism, recent mild elevation of TSH. Continue levothyroxine 112 mcg a day. TSH monitoring today.  4.  Generalized anxiety disorder. Question of mood disorder. I think her maintenance insomnia is related to these. She does get some benefit from buspirone 5 mg twice a day. No new medication at this time. Consider future trial of SSRI or SNRI if all medical workup normal.  An After Visit Summary was printed and given to the patient.  FOLLOW UP: Return in about 2 weeks (around 07/02/2023) for f/u wt, arthralgias, mood, anx. Next CPE  10/2023 Signed:  Santiago Bumpers, MD           06/18/2023

## 2023-06-18 NOTE — Patient Instructions (Addendum)
Please continue using your CPAP regularly. While your insurance requires that you use CPAP at least 4 hours each night on 70% of the nights, I recommend, that you not skip any nights and use it throughout the night if you can. Getting used to CPAP and staying with the treatment long term does take time and patience and discipline. Untreated obstructive sleep apnea when it is moderate to severe can have an adverse impact on cardiovascular health and raise her risk for heart disease, arrhythmias, hypertension, congestive heart failure, stroke and diabetes. Untreated obstructive sleep apnea causes sleep disruption, nonrestorative sleep, and sleep deprivation. This can have an impact on your day to day functioning and cause daytime sleepiness and impairment of cognitive function, memory loss, mood disturbance, and problems focussing. Using CPAP regularly can improve these symptoms.  We will update supply orders, today. Keep an eye on your BP. Treat your headache when you get home. Follow up closely with Dr Milinda Cave  Follow up in 1 year

## 2023-06-19 ENCOUNTER — Other Ambulatory Visit: Payer: Self-pay

## 2023-06-19 ENCOUNTER — Ambulatory Visit
Admission: RE | Admit: 2023-06-19 | Discharge: 2023-06-19 | Disposition: A | Discharge status: Home / Self Care | Payer: Medicare HMO | Source: Ambulatory Visit | Attending: Internal Medicine | Admitting: Internal Medicine

## 2023-06-19 VITALS — Resp 20

## 2023-06-19 DIAGNOSIS — T3695XA Adverse effect of unspecified systemic antibiotic, initial encounter: Secondary | ICD-10-CM | POA: Diagnosis not present

## 2023-06-19 DIAGNOSIS — N3001 Acute cystitis with hematuria: Secondary | ICD-10-CM | POA: Insufficient documentation

## 2023-06-19 DIAGNOSIS — B379 Candidiasis, unspecified: Secondary | ICD-10-CM | POA: Insufficient documentation

## 2023-06-19 LAB — POCT URINALYSIS DIP (MANUAL ENTRY)
Bilirubin, UA: NEGATIVE
Glucose, UA: NEGATIVE mg/dL
Ketones, POC UA: NEGATIVE mg/dL
Nitrite, UA: NEGATIVE
Protein Ur, POC: NEGATIVE mg/dL
Spec Grav, UA: 1.01 (ref 1.010–1.025)
Urobilinogen, UA: 0.2 U/dL
pH, UA: 6 (ref 5.0–8.0)

## 2023-06-19 LAB — TSH: TSH: 4.7 m[IU]/L — ABNORMAL HIGH (ref 0.40–4.50)

## 2023-06-19 MED ORDER — CEPHALEXIN 500 MG PO CAPS: 500.0000 mg | ORAL_CAPSULE | Freq: Two times a day (BID) | ORAL | 0 refills | Status: AC

## 2023-06-19 MED ORDER — FLUCONAZOLE 150 MG PO TABS: 150.0000 mg | ORAL_TABLET | ORAL | 0 refills | Status: DC

## 2023-06-19 NOTE — Discharge Instructions (Signed)

## 2023-06-19 NOTE — ED Provider Notes (Signed)
[facility]    CSN: [id number] Arrival date & time: 06/19/23  1130      History   Chief Complaint Chief Complaint  Patient presents with   Urinary Frequency    Cloudy urine, burning sensation; maybe a UTI - Entered by patient    HPI Theresa Young is a 67 y.o. female.   Theresa Young is a 67 y.o. female presenting for chief complaint of dysuria, urinary frequency, urinary urgency, and sensation of incomplete bladder emptying with suprapubic lower abdominal discomfort that started yesterday.  States she has not had a urinary tract infection in "years" but this feels very similar to the last time she had a UTI.  She is a little bit of pinkish tinge to the toilet tissue when she wiped after using the restroom to void this morning.  States she recently had an episode of diarrhea 2 days ago and wonders if this may have caused urinary symptoms.  History of prediabetes, does not take any SGLT2 inhibitors.  No nausea, vomiting, low back pain, flank pain, fevers, chills, dizziness, or headaches.  Denies recent antibiotic or steroid use.  She does report that she likes to drink a lot of diet sodas and drinks probably equal parts diet soda and water.  Has not attempted use of any OTC medicines to help with symptoms PTA.    Urinary Frequency    Past Medical History:  Diagnosis Date   Abdominal pain 2021   CT abd neg. suspect abd wall pain vs functional GI pain   Carpal tunnel syndrome    Cervical spondylosis    Depression    GERD (gastroesophageal reflux disease)    with mild esoph dysmotility and small sliding HH on barium esophagram 06/2022   History of MRSA infection    R hand soft tissue   Hyperlipidemia    Hypertension    Hypothyroidism    IFG (impaired fasting glucose)    A1c's 5.5-5.6% 2019-2022   Joint pain    Migraine syndrome    occasional   Obesity, Class II, BMI 35-39.9    OSA on CPAP 11/2013   severe; needs compliance monitoring->referred to neurologist  ([facility])   Osteoarthritis    Knees, ankles, fingers, wrists, sometimes hips.  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    Patient Active Problem List   Diagnosis Date Noted   Pain in both hands 05/11/2023   Stress at home 02/02/2023   BMI 39.0-39.9,adult 10/06/2022   Anxiety 09/02/2022   Umbilical hernia without obstruction and without gangrene 09/02/2022   Dysphagia 06/26/2022   Hypercholesterolemia 06/11/2022   Prediabetes 03/24/2022   Postoperative hypothyroidism 03/20/2022   Other fatigue 03/20/2022   SOB (shortness of breath) on exertion 03/20/2022   Impaired fasting glucose 03/20/2022   Vitamin D deficiency 03/20/2022   OSA on CPAP 03/20/2022   Health care maintenance 03/20/2022   Hypertriglyceridemia 03/20/2022   Essential hypertension 03/20/2022   Morbid obesity (HCC) 03/20/2022   Vitiligo 10/23/2021   Gastroesophageal reflux disease 10/05/2020   Encounter for screening fecal occult blood testing 07/25/2020   Encounter for gynecological examination with Papanicolaou smear of cervix 07/25/2020   Encounter for well woman exam with routine gynecological exam 02/18/2018   Screening for colorectal cancer 02/18/2018   Encounter for routine gynecological examination with Papanicolaou smear of cervix 02/16/2017   Breast abscess 07/28/2016   Hypothyroid 12/21/2014  Sleep apnea 12/21/2014   Snores 12/05/2013   Migraine headache 06/24/2007   Essential hypertension 06/24/2007   Closed fracture of metatarsal bone 06/24/2007    Past Surgical History:  Procedure Laterality Date   APPENDECTOMY  6 yrs ago   Dr. Lovell Sheehan   COLONOSCOPY  2012; 02/20/20   approx 2012, normal. 01/2020 polyp x 1->per Dr. Loreta Ave recall 5-10 yrs   CYSTOSCOPY     for pain in side; normal   ESOPHAGOGASTRODUODENOSCOPY     This was done for dysphagia/regurgitation.  Esophageal stricture but no dilatation required.-->   Symptoms improved with daily PPI.   FOOT SURGERY Right 2017   dorsal tarsal exostectomy, endoscopic plantar fasciotomy, bunionectomy with screw and removal of toenail 2nd toe R foot.   GANGLION CYST EXCISION     polysomnogram  11/2013   severe OSA   THYROIDECTOMY  12/10/2011   Bx benign.  Enlarging goiter.  Procedure: THYROIDECTOMY;  Surgeon: Dalia Heading, MD;  Location: [facility];  Service: General;  Laterality: N/A;  Total Thyroidectomy   TRANSTHORACIC ECHOCARDIOGRAM  05/17/2015   EF 65-70%, mod LVH, normal wall motion, grd I DD, mild MR.   UPPER GI ENDOSCOPY      OB History     Gravida  0   Para  0   Term  0   Preterm  0   AB  0   Living  0      SAB  0   IAB  0   Ectopic  0   Multiple  0   Live Births               Home Medications    Prior to Admission medications   Medication Sig Start Date End Date Taking? Authorizing Provider  cephALEXin (KEFLEX) 500 MG capsule Take 1 capsule (500 mg total) by mouth 2 (two) times daily for 7 days. 06/19/23 06/26/23 Yes Carlisle Beers, FNP  fluconazole (DIFLUCAN) 150 MG tablet Take 1 tablet (150 mg total) by mouth every 3 (three) days. 06/19/23  Yes Carlisle Beers, FNP  acetaminophen (TYLENOL 8 HOUR) 650 MG CR tablet Take 650 mg by mouth every 8 (eight) hours as needed for pain.    [provider]  busPIRone (BUSPAR) 5 MG tablet Take 1 tablet (5 mg total) by mouth 2 (two) times daily. Patient taking differently: Take 5 mg by mouth daily. 02/02/23   Adline Potter, NP  eletriptan (RELPAX) 40 MG tablet Take 40 mg by mouth as needed. may repeat in 2 hours if necessary for migraines    [provider]  levothyroxine (SYNTHROID) 112 MCG tablet TAKE (1) TABLET BY MOUTH EACH MORNING ON AN EMPTY STOMACH. 10/24/22   McGowen, Maryjean Morn, MD  losartan-hydrochlorothiazide (HYZAAR) 100-12.5 MG tablet TAKE (1) TABLET BY MOUTH ONCE DAILY. 06/10/23   McGowen, Maryjean Morn, MD  pantoprazole (PROTONIX) 40 MG  tablet Take 40 mg by mouth daily. 07/03/22   [provider]  rosuvastatin (CRESTOR) 10 MG tablet TAKE (1) TABLET BY MOUTH ONCE DAILY. 05/01/23   McGowen, Maryjean Morn, MD  VITAMIN D PO Take by mouth daily.    [provider]    Family History Family History  Problem Relation Age of Onset   Hypertension Mother    Cancer Mother        lung   Migraines Mother    Ulcers Mother    Hypertension Father    Migraines Father    Heart disease Father  Ulcers Father    Early death Sister    Diabetes Sister    Diabetes Paternal Grandmother    Kidney disease Paternal Grandfather        kidney failure   Heart attack Paternal Grandfather    Early death Sister    Obesity Sister    Hypertension Sister    Migraines Sister    Cancer Sister        ? cervical cancer   Multiple sclerosis Sister    Breast cancer Sister    Pseudochol deficiency Neg Hx    Malignant hyperthermia Neg Hx    Hypotension Neg Hx    Anesthesia problems Neg Hx     Social History Social History   Tobacco Use   Smoking status: Never   Smokeless tobacco: Never  Vaping Use   Vaping status: Never Used  Substance Use Topics   Alcohol use: Not Currently    Comment: very rarely   Drug use: No     Allergies   Bactrim [sulfamethoxazole-trimethoprim], Erythromycin, and Iodinated contrast media   Review of Systems Review of Systems  Genitourinary:  Positive for frequency.  Per HPI   Physical Exam Triage Vital Signs ED Triage Vitals  Encounter Vitals Group     BP --      Systolic BP Percentile --      Diastolic BP Percentile --      Pulse --      Resp 06/19/23 1137 20     Temp --      Temp Source 06/19/23 1137 Oral     SpO2 --      Weight --      Height --      Head Circumference --      Peak Flow --      Pain Score 06/19/23 1135 3     Pain Loc --      Pain Education --      Exclude from Growth Chart --    No data found.  Updated Vital Signs Resp 20   Visual Acuity Right Eye  Distance:   Left Eye Distance:   Bilateral Distance:    Right Eye Near:   Left Eye Near:    Bilateral Near:     Physical Exam Vitals and nursing note reviewed.  Constitutional:      Appearance: She is not ill-appearing or toxic-appearing.  HENT:     Head: Normocephalic and atraumatic.     Right Ear: Hearing and external ear normal.     Left Ear: Hearing and external ear normal.     Nose: Nose normal.     Mouth/Throat:     Lips: Pink.  Eyes:     General: Lids are normal. Vision grossly intact. Gaze aligned appropriately.     Extraocular Movements: Extraocular movements intact.     Conjunctiva/sclera: Conjunctivae normal.  Pulmonary:     Effort: Pulmonary effort is normal.  Abdominal:     General: Bowel sounds are normal.     Palpations: Abdomen is soft.     Tenderness: There is no abdominal tenderness. There is no right CVA tenderness, left CVA tenderness or guarding.  Musculoskeletal:     Cervical back: Neck supple.  Skin:    General: Skin is warm and dry.     Capillary Refill: Capillary refill takes less than 2 seconds.     Findings: No rash.  Neurological:     General: No focal deficit present.     Mental Status:  She is alert and oriented to person, place, and time. Mental status is at baseline.     Cranial Nerves: No dysarthria or facial asymmetry.  Psychiatric:        Mood and Affect: Mood normal.        Speech: Speech normal.        Behavior: Behavior normal.        Thought Content: Thought content normal.        Judgment: Judgment normal.      UC Treatments / Results  Labs (all labs ordered are listed, but only abnormal results are displayed) Labs Reviewed  POCT URINALYSIS DIP (MANUAL ENTRY) - Abnormal; Notable for the following components:      Result Value   Clarity, UA cloudy (*)    Blood, UA large (*)    Leukocytes, UA Small (1+) (*)    All other components within normal limits  URINE CULTURE    EKG   Radiology No results  found.  Procedures Procedures (including critical care time)  Medications Ordered in UC Medications - No data to display  Initial Impression / Assessment and Plan / UC Course  I have reviewed the triage vital signs and the nursing notes.  Pertinent labs & imaging results that were available during my care of the patient were reviewed by me and considered in my medical decision making (see chart for details).   1. Acute cystitis with hematuria, antibiotic induced yeast infection Presentation is consistent with acute uncomplicated cystitis. Patient is nontoxic in appearance with hemodynamically stable vital signs. Low suspicion for acute pyelonephritis. Low suspicion for kidney stone or infected stone. No indication for labs or imaging at this time. Keflex antibiotic sent to pharmacy.  Urine culture pending. Patient to push fluids to stay well hydrated and reduce intake of known urinary irritants. At discharge, patient mentions concern for acute yeast vaginitis after antibiotic use.  Will treat empirically with Diflucan every 3 days for 2 doses while on antibiotic.  Counseled patient on potential for adverse effects with medications prescribed/recommended today, strict ER and return-to-clinic precautions discussed, patient verbalized understanding.    Final Clinical Impressions(s) / UC Diagnoses   Final diagnoses:  Acute cystitis with hematuria     Discharge Instructions      Your urine shows you likely have a urinary tract infection.  I have sent your urine for culture to confirm this.  We will call you if we need to change your antibiotic when we find out the type of bacteria growing in your bladder.  Take antibiotic as directed with a snack/food to avoid stomach upset. To avoid GI upset please take this medication with food.   Avoid drinking beverages that irritate the urinary tract like sodas, tea, coffee, or juice. Drink plenty of water to stay well hydrated and prevent  severe infection.  If you develop any new or worsening symptoms or if your symptoms do not start to improve, pleases return here or follow-up with your primary care provider. If your symptoms are severe, please go to the emergency room.     ED Prescriptions     Medication Sig Dispense Auth. Provider   cephALEXin (KEFLEX) 500 MG capsule Take 1 capsule (500 mg total) by mouth 2 (two) times daily for 7 days. 14 capsule Reita May M, FNP   fluconazole (DIFLUCAN) 150 MG tablet Take 1 tablet (150 mg total) by mouth every 3 (three) days. 2 tablet Carlisle Beers, FNP      PDMP  not reviewed this encounter.   Carlisle Beers, [place] 06/19/23 1158

## 2023-06-19 NOTE — ED Triage Notes (Signed)
Pt reports dysuria yesterday and reports "cloudy" urine with odor. Denies any known fevers, nausea.

## 2023-06-20 LAB — INSULIN AND C-PEPTIDE, SERUM
C-Peptide: 13.3 ng/mL — ABNORMAL HIGH (ref 1.1–4.4)
INSULIN: 112 u[IU]/mL — ABNORMAL HIGH (ref 2.6–24.9)

## 2023-06-21 LAB — URINE CULTURE: Culture: >=100000 — AB

## 2023-06-22 ENCOUNTER — Ambulatory Visit: Payer: Medicare HMO | Admitting: Family Medicine

## 2023-06-22 ENCOUNTER — Encounter: Payer: Self-pay | Admitting: Family Medicine

## 2023-06-22 ENCOUNTER — Telehealth: Payer: Self-pay

## 2023-06-22 VITALS — BP 160/92 | HR 79 | Ht 67.0 in | Wt 272.5 lb

## 2023-06-22 DIAGNOSIS — G4733 Obstructive sleep apnea (adult) (pediatric): Secondary | ICD-10-CM | POA: Diagnosis not present

## 2023-06-22 NOTE — Telephone Encounter (Signed)
Faxed CPAP Supply order to [facility] [phone] on 06/22/2023.

## 2023-06-27 LAB — TIER 1
Chromatin (Nucleosomal) Antibody: <1 AI
ENA SM Ab Ser-aCnc: <1 AI
Ribonucleic Protein(ENA) Antibody, IgG: 3.3 AI — AB
SM/RNP: <1 AI
ds DNA Ab: <1 [IU]/mL

## 2023-06-27 LAB — COMPREHENSIVE METABOLIC PANEL
AG Ratio: 2.1 (calc) (ref 1.0–2.5)
ALT: 27 U/L (ref 6–29)
AST: 23 U/L (ref 10–35)
Albumin: 4.4 g/dL (ref 3.6–5.1)
Alkaline phosphatase (APISO): 63 U/L (ref 37–153)
BUN: 11 mg/dL (ref 7–25)
CO2: 21 mmol/L (ref 20–32)
Calcium: 9.5 mg/dL (ref 8.6–10.4)
Chloride: 105 mmol/L (ref 98–110)
Creat: 0.92 mg/dL (ref 0.50–1.05)
Globulin: 2.1 g/dL (ref 1.9–3.7)
Glucose, Bld: 133 mg/dL — ABNORMAL HIGH (ref 65–99)
Potassium: 3.5 mmol/L (ref 3.5–5.3)
Sodium: 141 mmol/L (ref 135–146)
Total Bilirubin: 1 mg/dL (ref 0.2–1.2)
Total Protein: 6.5 g/dL (ref 6.1–8.1)

## 2023-06-27 LAB — CBC WITH DIFFERENTIAL/PLATELET
Absolute Lymphocytes: 1519 {cells}/uL (ref 850–3900)
Absolute Monocytes: 511 {cells}/uL (ref 200–950)
Basophils Absolute: 43 {cells}/uL (ref 0–200)
Basophils Relative: 0.6 %
Eosinophils Absolute: 277 {cells}/uL (ref 15–500)
Eosinophils Relative: 3.9 %
HCT: 42.2 % (ref 35.0–45.0)
Hemoglobin: 14.2 g/dL (ref 11.7–15.5)
MCH: 30.7 pg (ref 27.0–33.0)
MCHC: 33.6 g/dL (ref 32.0–36.0)
MCV: 91.1 fL (ref 80.0–100.0)
MPV: 10.1 fL (ref 7.5–12.5)
Monocytes Relative: 7.2 %
Neutro Abs: 4750 {cells}/uL (ref 1500–7800)
Neutrophils Relative %: 66.9 %
Platelets: 188 10*3/uL (ref 140–400)
RBC: 4.63 10*6/uL (ref 3.80–5.10)
RDW: 12.6 % (ref 11.0–15.0)
Total Lymphocyte: 21.4 %
WBC: 7.1 10*3/uL (ref 3.8–10.8)

## 2023-06-27 LAB — ANA SCREEN,IFA,REFLEX TITER/PATTERN,REFLEX MPLX 11 AB CASCADE
Anti Nuclear Antibody (ANA): POSITIVE — AB
Cyclic Citrullin Peptide Ab: <16 U
MUTATED CITRULLINATED VIMENTIN (MCV) AB: <20 U/mL (ref <20)
Rheumatoid fact SerPl-aCnc: <10 [IU]/mL (ref <14)

## 2023-06-27 LAB — SEDIMENTATION RATE: Sed Rate: 6 mm/h (ref 0–30)

## 2023-06-27 LAB — ANTI-NUCLEAR AB-TITER (ANA TITER): ANA Titer 1: 1:40 {titer} — ABNORMAL HIGH

## 2023-06-27 LAB — CORTISOL: Cortisol, Plasma: 11.7 ug/dL

## 2023-06-27 LAB — HEMOGLOBIN A1C
Hgb A1c MFr Bld: 6 %{Hb} — ABNORMAL HIGH (ref <5.7)
Mean Plasma Glucose: 126 mg/dL
eAG (mmol/L): 7 mmol/L

## 2023-06-27 LAB — INTERPRETATION

## 2023-06-27 LAB — TSH: TSH: 4.55 m[IU]/L — ABNORMAL HIGH (ref 0.40–4.50)

## 2023-07-02 ENCOUNTER — Ambulatory Visit: Payer: Medicare HMO | Admitting: Family Medicine

## 2023-07-02 ENCOUNTER — Encounter: Payer: Self-pay | Admitting: Family Medicine

## 2023-07-02 VITALS — BP 117/77 | HR 78 | Wt 278.8 lb

## 2023-07-02 DIAGNOSIS — R102 Pelvic and perineal pain: Secondary | ICD-10-CM | POA: Diagnosis not present

## 2023-07-02 DIAGNOSIS — E161 Other hypoglycemia: Secondary | ICD-10-CM

## 2023-07-02 DIAGNOSIS — F411 Generalized anxiety disorder: Secondary | ICD-10-CM

## 2023-07-02 DIAGNOSIS — R635 Abnormal weight gain: Secondary | ICD-10-CM

## 2023-07-02 DIAGNOSIS — R7303 Prediabetes: Secondary | ICD-10-CM | POA: Diagnosis not present

## 2023-07-02 DIAGNOSIS — E88819 Insulin resistance, unspecified: Secondary | ICD-10-CM

## 2023-07-02 DIAGNOSIS — R768 Other specified abnormal immunological findings in serum: Secondary | ICD-10-CM

## 2023-07-02 LAB — POCT URINALYSIS DIPSTICK
Bilirubin, UA: NEGATIVE
Blood, UA: POSITIVE
Glucose, UA: NEGATIVE
Ketones, UA: NEGATIVE
Leukocytes, UA: NEGATIVE
Nitrite, UA: NEGATIVE
Protein, UA: NEGATIVE
Spec Grav, UA: 1.01 (ref 1.010–1.025)
Urobilinogen, UA: 0.2 U/dL
pH, UA: 6 (ref 5.0–8.0)

## 2023-07-02 MED ORDER — METFORMIN HCL 500 MG PO TABS: 500.0000 mg | ORAL_TABLET | Freq: Two times a day (BID) | ORAL | 0 refills | Status: DC

## 2023-07-02 NOTE — Progress Notes (Signed)
OFFICE VISIT  07/02/2023  CC:  Chief Complaint  Patient presents with   Anxiety    F/U. Pt states there have been no changes since her last OV.   Abnormal Weight Gain    F/U. Pt states there have been no changes since her last OV. Pt's weight at the time of last visit was 272. Today 278.      Patient is a 67 y.o. female who presents for 2-week follow-up generalized anxiety, abnormal weight gain, polyarthralgia. A/P as of last visit: "#1 abnormal weight gain. Check cortisol, insulin and C-peptide, hemoglobin A1c, CBC, c-Met, and TSH.   #2 polyarthralgia. Recent positive ANA, no titer was done. We will do a complete ANA panel today.   #3 hypothyroidism, recent mild elevation of TSH. Continue levothyroxine 112 mcg a day. TSH monitoring today.   4.  Generalized anxiety disorder. Question of mood disorder. I think her maintenance insomnia is related to these. She does get some benefit from buspirone 5 mg twice a day. No new medication at this time. Consider future trial of SSRI or SNRI if all medical workup normal".  INTERIM HX: The day after I last saw her she presented to [facility] with urinary tract infection symptoms. Urine culture showed pansensitive E. coli.  She took a 7-day course of Keflex. She feels a bit of suprapubic pressure/discomfort still as well as possibly a mild increase in urinary frequency.  No urinary urgency, dysuria, or blood in urine.  No fever or flank pain.  We reviewed her labs from her last visit with me today. ANA positive, 1:40 titer Ribonucleoprotein antibody IgG positive at 3.3 (less than 1 is negative). Insulin and C-peptide both significantly elevated. Hemoglobin A1c was 6.0%. TSH was at the upper limit of normal at 4.55. Otherwise all labs normal.  Still having some significant periods of depression and anxiety, nothing profound and lasting, though. Taking BuSpar 5 mg 1-2 times a day.  No change in her chronic fatigue and  generalized body pains.  Past Medical History:  Diagnosis Date   Abdominal pain 2021   CT abd neg. suspect abd wall pain vs functional GI pain   Carpal tunnel syndrome    Cervical spondylosis    Depression    GERD (gastroesophageal reflux disease)    with mild esoph dysmotility and small sliding HH on barium esophagram 06/2022   History of MRSA infection    R hand soft tissue   Hyperlipidemia    Hypertension    Hypothyroidism    IFG (impaired fasting glucose)    A1c's 5.5-5.6% 2019-2022   Joint pain    Migraine syndrome    occasional   Obesity, Class II, BMI 35-39.9    OSA on CPAP 11/2013   severe; needs compliance monitoring->referred to neurologist ([facility])   Osteoarthritis    Knees, ankles, fingers, wrists, sometimes hips.  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    Past Surgical History:  Procedure Laterality Date   APPENDECTOMY  6 yrs ago   Dr. Lovell Sheehan   COLONOSCOPY  2012; 02/20/20   approx 2012, normal. 01/2020 polyp x 1->per Dr. Loreta Ave recall 5-10 yrs   CYSTOSCOPY     for pain in side; normal   ESOPHAGOGASTRODUODENOSCOPY     This was done for dysphagia/regurgitation.  Esophageal stricture but no dilatation required.-->  Symptoms improved with daily PPI.   FOOT SURGERY  Right 2017   dorsal tarsal exostectomy, endoscopic plantar fasciotomy, bunionectomy with screw and removal of toenail 2nd toe R foot.   GANGLION CYST EXCISION     polysomnogram  11/2013   severe OSA   THYROIDECTOMY  12/10/2011   Bx benign.  Enlarging goiter.  Procedure: THYROIDECTOMY;  Surgeon: Dalia Heading, MD;  Location: [facility];  Service: General;  Laterality: N/A;  Total Thyroidectomy   TRANSTHORACIC ECHOCARDIOGRAM  05/17/2015   EF 65-70%, mod LVH, normal wall motion, grd I DD, mild MR.   UPPER GI ENDOSCOPY      Outpatient Medications Prior to Visit  Medication Sig Dispense Refill   acetaminophen (TYLENOL  8 HOUR) 650 MG CR tablet Take 650 mg by mouth every 8 (eight) hours as needed for pain.     busPIRone (BUSPAR) 5 MG tablet Take 1 tablet (5 mg total) by mouth 2 (two) times daily. (Patient taking differently: Take 5 mg by mouth daily.) 60 tablet 6   eletriptan (RELPAX) 40 MG tablet Take 40 mg by mouth as needed. may repeat in 2 hours if necessary for migraines     fluconazole (DIFLUCAN) 150 MG tablet Take 1 tablet (150 mg total) by mouth every 3 (three) days. 2 tablet 0   levothyroxine (SYNTHROID) 112 MCG tablet TAKE (1) TABLET BY MOUTH EACH MORNING ON AN EMPTY STOMACH. 90 tablet 1   losartan-hydrochlorothiazide (HYZAAR) 100-12.5 MG tablet TAKE (1) TABLET BY MOUTH ONCE DAILY. 90 tablet 3   pantoprazole (PROTONIX) 40 MG tablet Take 40 mg by mouth daily.     rosuvastatin (CRESTOR) 10 MG tablet TAKE (1) TABLET BY MOUTH ONCE DAILY. 90 tablet 1   VITAMIN D PO Take by mouth daily.     No facility-administered medications prior to visit.    Allergies  Allergen Reactions   Bactrim [Sulfamethoxazole-Trimethoprim] Nausea And Vomiting   Erythromycin Hives    REACTION: rash on trunk of body   Iodinated Contrast Media Swelling and Other (See Comments)    Reaction: itching,swelling around the eyes    Review of Systems As per HPI  PE:    07/02/2023   10:30 AM 06/22/2023   11:13 AM 06/22/2023   10:54 AM  Vitals with BMI  Height   5\' 7"   Weight 278 lbs 13 oz  272 lbs 8 oz  BMI 43.66  42.67  Systolic 117 160 355  Diastolic 77 92 109  Pulse 78  79     Physical Exam  Gen: Alert, well appearing.  Patient is oriented to person, place, time, and situation. AFFECT: pleasant, lucid thought and speech. No further exam today  LABS:  Last CBC Lab Results  Component Value Date   WBC 7.1 06/18/2023   HGB 14.2 06/18/2023   HCT 42.2 06/18/2023   MCV 91.1 06/18/2023   MCH 30.7 06/18/2023   RDW 12.6 06/18/2023   PLT 188 06/18/2023   Last metabolic panel Lab Results  Component Value Date    GLUCOSE 133 (H) 06/18/2023   NA 141 06/18/2023   K 3.5 06/18/2023   CL 105 06/18/2023   CO2 21 06/18/2023   BUN 11 06/18/2023   CREATININE 0.92 06/18/2023   GFR 63.97 10/24/2022   CALCIUM 9.5 06/18/2023   PHOS 3.9 12/11/2011   PROT 6.5 06/18/2023   ALBUMIN 4.4 10/24/2022   LABGLOB 2.0 03/20/2022   AGRATIO 2.3 (H) 03/20/2022   BILITOT 1.0 06/18/2023   ALKPHOS 57 10/24/2022   AST 23 06/18/2023   ALT 27 06/18/2023  ANIONGAP 10 11/18/2018   Last thyroid functions Lab Results  Component Value Date   TSH 4.55 (H) 06/18/2023   TSH 4.70 (H) 06/18/2023   Lab Results  Component Value Date   ESRSEDRATE 6 06/18/2023   Lab Results  Component Value Date   ANA POSITIVE (A) 06/18/2023    IMPRESSION AND PLAN:  #1  #2 Polyarthralgia, chronic fatigue. ANA +, 1:40, unclear clinical significance. Ribonucleoprotein antibody IgG positive at 3.3 (less than 1 is negative)--> unclear clinical significance. She has initial rheumatology consult set for February 2025.  #3 abnormal weight gain. She has hyperinsulinism/insulin resistance. Hemoglobin A1c is 6.0%. Start metformin 500 mg twice daily. Therapeutic expectations and side effect profile of medication discussed today.  Patient's questions answered.  #4 Generalized anxiety disorder. Question of mood disorder. I think her maintenance insomnia is related to these. She does get some benefit from buspirone 5 mg twice a day. No new medication at this time. Consider future trial of SSRI or SNRI.  #5 UTI, minimal residual suprapubic pressure status post 7-day Keflex treatment. UA today with trace blood, otherwise normal. Will send for culture and susceptibility testing. No antibiotics at this time.  An After Visit Summary was printed and given to the patient.  FOLLOW UP: Return in about 4 weeks (around 07/30/2023) for f/u prediab/metform.  Signed:  Santiago Bumpers, MD           07/02/2023

## 2023-07-03 LAB — URINE CULTURE
MICRO NUMBER:: 15700282
Result:: NO GROWTH
SPECIMEN QUALITY:: ADEQUATE

## 2023-07-15 ENCOUNTER — Other Ambulatory Visit: Payer: Self-pay | Admitting: Family Medicine

## 2023-07-29 NOTE — Progress Notes (Unsigned)
OFFICE VISIT  07/30/2023  CC:  Chief Complaint  Patient presents with   Anxiety    Patient is a 67 y.o. female who presents for 1 mo f/u prediabetes and anx/mood. A/P as of last visit: " 1 Polyarthralgia, chronic fatigue. ANA +, 1:40, unclear clinical significance. Ribonucleoprotein antibody IgG positive at 3.3 (less than 1 is negative)--> unclear clinical significance. She has initial rheumatology consult set for February 2025.   #2 abnormal weight gain. She has hyperinsulinism/insulin resistance. Hemoglobin A1c is 6.0%. Start metformin 500 mg twice daily. Therapeutic expectations and side effect profile of medication discussed today.  Patient's questions answered.   #3 Generalized anxiety disorder. Question of mood disorder. I think her maintenance insomnia is related to these. She does get some benefit from buspirone 5 mg twice a day. No new medication at this time. Consider future trial of SSRI or SNRI.   #4 UTI, minimal residual suprapubic pressure status post 7-day Keflex treatment. UA today with trace blood, otherwise normal. Will send for culture and susceptibility testing. No antibiotics at this time."  INTERIM HX: Urine no growth last visit.  She is feeling better: Feels less stressed and anxious, is eating better, feels less tired and achy. She still wakes up a few times a night but is able to go right back to sleep. Initially had mild GI side effects from metformin but this has resolved.  She is pleased with her progress.  Past Medical History:  Diagnosis Date   Abdominal pain 2021   CT abd neg. suspect abd wall pain vs functional GI pain   Carpal tunnel syndrome    Cervical spondylosis    Depression    GERD (gastroesophageal reflux disease)    with mild esoph dysmotility and small sliding HH on barium esophagram 06/2022   History of MRSA infection    R hand soft tissue   Hyperlipidemia    Hypertension    Hypothyroidism    IFG (impaired fasting  glucose)    A1c's 5.5-5.6% 2019-2022   Joint pain    Migraine syndrome    occasional   Obesity, Class II, BMI 35-39.9    OSA on CPAP 11/2013   severe; needs compliance monitoring->referred to neurologist ([facility])   Osteoarthritis    Knees, ankles, fingers, wrists, sometimes hips.  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    Past Surgical History:  Procedure Laterality Date   APPENDECTOMY  6 yrs ago   Dr. Lovell Sheehan   COLONOSCOPY  2012; 02/20/20   approx 2012, normal. 01/2020 polyp x 1->per Dr. Loreta Ave recall 5-10 yrs   CYSTOSCOPY     for pain in side; normal   ESOPHAGOGASTRODUODENOSCOPY     This was done for dysphagia/regurgitation.  Esophageal stricture but no dilatation required.-->  Symptoms improved with daily PPI.   FOOT SURGERY Right 2017   dorsal tarsal exostectomy, endoscopic plantar fasciotomy, bunionectomy with screw and removal of toenail 2nd toe R foot.   GANGLION CYST EXCISION     polysomnogram  11/2013   severe OSA   THYROIDECTOMY  12/10/2011   Bx benign.  Enlarging goiter.  Procedure: THYROIDECTOMY;  Surgeon: Dalia Heading, MD;  Location: [facility];  Service: General;  Laterality: N/A;  Total Thyroidectomy   TRANSTHORACIC ECHOCARDIOGRAM  05/17/2015   EF 65-70%, mod LVH, normal wall motion, grd I DD, mild MR.   UPPER GI ENDOSCOPY  Outpatient Medications Prior to Visit  Medication Sig Dispense Refill   acetaminophen (TYLENOL 8 HOUR) 650 MG CR tablet Take 650 mg by mouth every 8 (eight) hours as needed for pain.     busPIRone (BUSPAR) 5 MG tablet Take 1 tablet (5 mg total) by mouth 2 (two) times daily. (Patient taking differently: Take 5 mg by mouth daily.) 60 tablet 6   eletriptan (RELPAX) 40 MG tablet Take 40 mg by mouth as needed. may repeat in 2 hours if necessary for migraines     levothyroxine (SYNTHROID) 112 MCG tablet TAKE (1) TABLET BY MOUTH EACH MORNING ON AN EMPTY STOMACH.  90 tablet 0   losartan-hydrochlorothiazide (HYZAAR) 100-12.5 MG tablet TAKE (1) TABLET BY MOUTH ONCE DAILY. 90 tablet 3   metFORMIN (GLUCOPHAGE) 500 MG tablet Take 1 tablet (500 mg total) by mouth 2 (two) times daily with a meal. 60 tablet 0   pantoprazole (PROTONIX) 40 MG tablet Take 40 mg by mouth daily.     rosuvastatin (CRESTOR) 10 MG tablet TAKE (1) TABLET BY MOUTH ONCE DAILY. 90 tablet 1   VITAMIN D PO Take by mouth daily.     fluconazole (DIFLUCAN) 150 MG tablet Take 1 tablet (150 mg total) by mouth every 3 (three) days. 2 tablet 0   No facility-administered medications prior to visit.    Allergies  Allergen Reactions   Bactrim [Sulfamethoxazole-Trimethoprim] Nausea And Vomiting   Erythromycin Hives    REACTION: rash on trunk of body   Iodinated Contrast Media Swelling and Other (See Comments)    Reaction: itching,swelling around the eyes    Review of Systems As per HPI  PE:    07/30/2023   10:50 AM 07/02/2023   10:30 AM 06/22/2023   11:13 AM  Vitals with BMI  Weight 270 lbs 278 lbs 13 oz   BMI  43.66   Systolic 130 117 811  Diastolic 81 77 92  Pulse 75 78      Physical Exam  Gen: Alert, well appearing.  Patient is oriented to person, place, time, and situation. AFFECT: pleasant, lucid thought and speech. No further exam today  LABS:  Last CBC Lab Results  Component Value Date   WBC 7.1 06/18/2023   HGB 14.2 06/18/2023   HCT 42.2 06/18/2023   MCV 91.1 06/18/2023   MCH 30.7 06/18/2023   RDW 12.6 06/18/2023   PLT 188 06/18/2023   Last metabolic panel Lab Results  Component Value Date   GLUCOSE 133 (H) 06/18/2023   NA 141 06/18/2023   K 3.5 06/18/2023   CL 105 06/18/2023   CO2 21 06/18/2023   BUN 11 06/18/2023   CREATININE 0.92 06/18/2023   GFR 63.97 10/24/2022   CALCIUM 9.5 06/18/2023   PHOS 3.9 12/11/2011   PROT 6.5 06/18/2023   ALBUMIN 4.4 10/24/2022   LABGLOB 2.0 03/20/2022   AGRATIO 2.3 (H) 03/20/2022   BILITOT 1.0 06/18/2023   ALKPHOS  57 10/24/2022   AST 23 06/18/2023   ALT 27 06/18/2023   ANIONGAP 10 11/18/2018   Last hemoglobin A1c Lab Results  Component Value Date   HGBA1C 6.0 (H) 06/18/2023   Lab Results  Component Value Date   TSH 4.55 (H) 06/18/2023   TSH 4.70 (H) 06/18/2023   IMPRESSION AND PLAN:  1 Polyarthralgia, chronic fatigue--> improving over the last 1 month.. ANA +, 1:40, unclear clinical significance. Ribonucleoprotein antibody IgG positive at 3.3 (less than 1 is negative)--> unclear clinical significance. She has initial rheumatology consult (Dr.  Sheliah Hatch) set for February 2025.   #2  Insulin resistance/prediabetes. Hemoglobin A1c 6.0% on 06/18/2023. Doing well on metformin 500 mg twice daily for the last 1 month.  Will continue this.   #3 Generalized anxiety disorder. Improving. She does get some benefit from buspirone 5 mg twice a day.  An After Visit Summary was printed and given to the patient.  FOLLOW UP: Return for 6 to 8-week follow-up prediabetes.  Signed:  Santiago Bumpers, MD           07/30/2023

## 2023-07-30 ENCOUNTER — Ambulatory Visit: Payer: Medicare HMO | Admitting: Family Medicine

## 2023-07-30 ENCOUNTER — Encounter: Payer: Self-pay | Admitting: Family Medicine

## 2023-07-30 VITALS — BP 130/81 | HR 75 | Wt 270.0 lb

## 2023-07-30 DIAGNOSIS — R7303 Prediabetes: Secondary | ICD-10-CM | POA: Diagnosis not present

## 2023-07-30 DIAGNOSIS — F411 Generalized anxiety disorder: Secondary | ICD-10-CM

## 2023-08-04 ENCOUNTER — Other Ambulatory Visit: Payer: Self-pay | Admitting: Family Medicine

## 2023-09-04 ENCOUNTER — Other Ambulatory Visit (HOSPITAL_COMMUNITY)
Admission: RE | Admit: 2023-09-04 | Discharge: 2023-09-04 | Disposition: A | Discharge status: Home / Self Care | Payer: Medicare HMO | Source: Ambulatory Visit | Attending: Adult Health | Admitting: Adult Health

## 2023-09-04 ENCOUNTER — Ambulatory Visit: Payer: Medicare HMO | Admitting: Adult Health

## 2023-09-04 ENCOUNTER — Encounter: Payer: Self-pay | Admitting: Adult Health

## 2023-09-04 VITALS — BP 135/80 | HR 73 | Ht 67.0 in | Wt 273.0 lb

## 2023-09-04 DIAGNOSIS — Z01419 Encounter for gynecological examination (general) (routine) without abnormal findings: Secondary | ICD-10-CM | POA: Insufficient documentation

## 2023-09-04 DIAGNOSIS — Z1331 Encounter for screening for depression: Secondary | ICD-10-CM | POA: Diagnosis not present

## 2023-09-04 DIAGNOSIS — Z1151 Encounter for screening for human papillomavirus (HPV): Secondary | ICD-10-CM | POA: Diagnosis not present

## 2023-09-04 DIAGNOSIS — F419 Anxiety disorder, unspecified: Secondary | ICD-10-CM | POA: Diagnosis not present

## 2023-09-04 DIAGNOSIS — Z0000 Encounter for general adult medical examination without abnormal findings: Secondary | ICD-10-CM

## 2023-09-04 DIAGNOSIS — Z1211 Encounter for screening for malignant neoplasm of colon: Secondary | ICD-10-CM | POA: Diagnosis not present

## 2023-09-04 LAB — HEMOCCULT GUIAC POC 1CARD (OFFICE): Fecal Occult Blood, POC: NEGATIVE

## 2023-09-04 MED ORDER — BUSPIRONE HCL 5 MG PO TABS: 5.0000 mg | ORAL_TABLET | Freq: Two times a day (BID) | ORAL | 6 refills | Status: DC

## 2023-09-04 NOTE — Progress Notes (Addendum)
 Patient ID: GLEE Young, female   DOB: 11/16/1955, 68 y.o.   MRN: [id number] History of Present Illness: Theresa Young is a 68 year old white female, married, PM in for a well woman gyn exam and pap. Has appt with rheumatologist in February.   PCP is Dr Candise    Current Medications, Allergies, Past Medical History, Past Surgical History, Family History and Social History were reviewed in [employer] electronic medical record.     Review of Systems: Patient denies any headaches, hearing loss, fatigue, blurred vision, shortness of breath, chest pain, abdominal pain, problems with bowel movements, urination, or intercourse. No joint pain or mood swings.     Physical Exam:BP 135/80 (BP Location: Left Arm, Patient Position: Sitting, Cuff Size: Large)   Pulse 73   Ht 5' 7 (1.702 m)   Wt 273 lb (123.8 kg)   BMI 42.76 kg/m   General:  Well developed, well nourished, no acute distress Skin:  Warm and dry Neck:  Midline trachea, normal thyroid , good ROM, no lymphadenopathy,no carotid bruits heard Lungs; Clear to auscultation bilaterally Breast:  No dominant palpable mass, retraction, or nipple discharge Cardiovascular: Regular rate and rhythm Abdomen:  Soft, non tender, no hepatosplenomegaly,has small umbilical hernia Pelvic:  External genitalia is normal in appearance, no lesions.  The vagina is pale. Urethra has no lesions or masses. The cervix is sooth, pap with HR HPV genotyping performed.  Uterus is felt to be normal size, shape, and contour.  No adnexal masses or tenderness noted.Bladder is non tender, no masses felt. Rectal: Good sphincter tone, no polyps, or hemorrhoids felt.  Hemoccult negative. Extremities/musculoskeletal:  No swelling or varicosities noted, no clubbing or cyanosis Psych:  No mood changes, alert and cooperative,seems happy AA is 0 Fall risk is low    09/04/2023    8:38 AM 06/18/2023    1:12 PM 05/11/2023    9:42 AM  Depression screen PHQ 2/9  Decreased Interest  1 0 0  Down, Depressed, Hopeless 0 1 1  PHQ - 2 Score 1 1 1   Altered sleeping 1 2 2   Tired, decreased energy 1 3 2   Change in appetite 0 0 0  Feeling bad or failure about yourself  0 0 0  Trouble concentrating 1 1 1   Moving slowly or fidgety/restless 1 1 1   Suicidal thoughts 0 0 0  PHQ-9 Score 5 8 7   Difficult doing work/chores  Somewhat difficult        09/04/2023    8:39 AM 06/18/2023    1:12 PM 05/11/2023    9:44 AM 04/29/2023    8:33 AM  GAD 7 : Generalized Anxiety Score  Nervous, Anxious, on Edge 1 2 1 1   Control/stop worrying 0 0 0 1  Worry too much - different things 1 2 1 1   Trouble relaxing 1 1 0 1  Restless 0 0 0 1  Easily annoyed or irritable 0 1 1 1   Afraid - awful might happen 1 1 1 1   Total GAD 7 Score 4 7 4 7   Anxiety Difficulty  Not difficult at all  Not difficult at all      Upstream - 09/04/23 9157       Pregnancy Intention Screening   Does the patient want to become pregnant in the next year? N/A    Does the patient's partner want to become pregnant in the next year? N/A    Would the patient like to discuss contraceptive options today? N/A  Contraception Wrap Up   Current Method No Method - Other Reason   PM   Reason for No Current Contraceptive Method at Intake ([facility] Only) Other    End Method No Method - Other Reason   PM   Contraception Counseling Provided No            Examination chaperoned by Clarita Salt LPN   Impression and Plan; 1. Encounter for gynecological examination with Papanicolaou smear of cervix (Primary) Pap sent Pap in 3 years if desired Physical in 1 year Has had labs with PCP Mammogram was negative 05/28/23 at [facility]  Colonoscopy per GI  - Cytology - PAP( [facility])  2. Encounter for screening fecal occult blood testing Hemoccult was negative  - POCT occult blood stool  3. Anxiety Good on Buspar ,will refill  Meds ordered this encounter  Medications   busPIRone  (BUSPAR ) 5 MG tablet    Sig: Take 1  tablet (5 mg total) by mouth 2 (two) times daily.    Dispense:  60 tablet    Refill:  6    Supervising Provider:   JAYNE MINDER H [[id number]]    Follow up in 6 months or sooner if needed

## 2023-09-08 LAB — CYTOLOGY - PAP
Comment: NEGATIVE
Diagnosis: NEGATIVE
High risk HPV: NEGATIVE

## 2023-09-24 ENCOUNTER — Ambulatory Visit: Payer: Medicare HMO | Admitting: Family Medicine

## 2023-09-30 NOTE — Progress Notes (Signed)
 Office Visit Note  Patient: Theresa Young             Date of Birth: [birth year]           MRN: [id number]             PCP: Candise Aleene DEL, MD Referring: Signa Delon LABOR, NP Visit Date: 10/01/2023 Occupation: Retired HR  Subjective:  New Patient (Initial Visit) (Patient states sometimes at night her hands tingle and burns. Patient states she has pain in the knuckles, wrists, knees, ankles, and toes. Patient states she sometimes has hip pain. Patient states she has had her thyroid  out and is on synthroid . Patient states her thyroid  levels have been changing. )   Discussed the use of AI scribe software for clinical note transcription with the patient, who gave verbal consent to proceed.  History of Present Illness  Theresa Young is a 68 y.o. female who presents for evaluation of abnormal lab results with positive ANA and RNP antibodies checked associated with joint pain and fatigue.  She experiences joint pain, particularly in her hands, with accompanying numbness and stinging sensations. These symptoms sometimes wake her at night, affecting her hands up to her elbows. She occasionally takes Tylenol  for relief but not daily.   She has a history of abnormal lab results, including a positive ANA and RNP antibody at a low level. These findings have prompted further investigation to determine if there is an inflammatory process contributing to her symptoms.  She has a history of osteoarthritis, which has been previously diagnosed. Her weight has increased since her early thirties, but it has not previously limited her activities. However, in the last year and a half, she has experienced increased fatigue and joint pain, affecting her ability to perform daily tasks. She has been participating in a weight loss program healthy weight and wellness and was seeing some progress down to about 245 pounds in late 2023 but subsequently saw progressive worsening despite which she felt was a similar  level of diet and exercise.  She has a history of sleep disturbances, including insomnia and fragmented sleep, despite using CPAP for obstructive sleep apnea. She usually falls asleep but will wake up again during the middle of the night.  She estimates that she sleeps between six and seven hours per night but feels exhausted after activities such as participating in a farmer's market.  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  She has experienced gastrointestinal issues, including a small umbilical hernia and difficulty swallowing, which led to an upper GI test. She was prescribed pantoprazole  for acid reflux, which was a change from her previous medication.  She has a history of migraines, which have significantly improved after treatment with imipramine in the past. She no longer experiences frequent migraines and does not regularly fill her prescriptions for migraine medication.  She reports a history of thyroid  issues, having undergone a thyroidectomy after recurrent swelling and biopsies. Her thyroid  levels have been stable on medication since the surgery.  She has experienced anxiety, particularly at night, over the past year, which she attributes to her health issues and changes in her life post-retirement. She has a history of situational depression related to family losses but does not currently feel depressed.    Activities of Daily Living:  Patient reports morning stiffness for 1-2 hours.   Patient Denies nocturnal pain.  Difficulty  dressing/grooming: Denies Difficulty climbing stairs: Denies Difficulty getting out of chair: Reports Difficulty using hands for taps, buttons, cutlery, and/or writing: Reports  Review of Systems  Constitutional:  Positive for fatigue.  HENT:  Negative for mouth sores and mouth dryness.   Eyes:  Negative for dryness.  Respiratory:   Positive for shortness of breath.   Cardiovascular:  Negative for chest pain and palpitations.  Gastrointestinal:  Positive for constipation and diarrhea. Negative for blood in stool.  Endocrine: Negative for increased urination.  Genitourinary:  Negative for involuntary urination.  Musculoskeletal:  Positive for joint pain, gait problem, joint pain, myalgias, muscle weakness, morning stiffness and myalgias. Negative for joint swelling and muscle tenderness.  Skin:  Positive for sensitivity to sunlight. Negative for color change, rash and hair loss.  Allergic/Immunologic: Negative for susceptible to infections.  Neurological:  Positive for dizziness and headaches.  Hematological:  Negative for swollen glands.  Psychiatric/Behavioral:  Positive for depressed mood and sleep disturbance. The patient is nervous/anxious.     PMFS History:  Patient Active Problem List   Diagnosis Date Noted   Positive ANA (antinuclear antibody) 10/01/2023   Carpal tunnel syndrome, right upper limb 10/01/2023   Pain in both hands 05/11/2023   Stress at home 02/02/2023   BMI 39.0-39.9,adult 10/06/2022   Anxiety 09/02/2022   Umbilical hernia without obstruction and without gangrene 09/02/2022   Dysphagia 06/26/2022   Hypercholesterolemia 06/11/2022   Prediabetes 03/24/2022   Postoperative hypothyroidism 03/20/2022   Other fatigue 03/20/2022   SOB (shortness of breath) on exertion 03/20/2022   Impaired fasting glucose 03/20/2022   Vitamin D  deficiency 03/20/2022   OSA on CPAP 03/20/2022   Health care maintenance 03/20/2022   Hypertriglyceridemia 03/20/2022   Essential hypertension 03/20/2022   Morbid obesity (HCC) 03/20/2022   Vitiligo 10/23/2021   Gastroesophageal reflux disease 10/05/2020   Encounter for screening fecal occult blood testing 07/25/2020   Encounter for gynecological examination with Papanicolaou smear of cervix 07/25/2020   Encounter for well woman exam with routine gynecological exam  02/18/2018   Screening for colorectal cancer 02/18/2018   Encounter for routine gynecological examination with Papanicolaou smear of cervix 02/16/2017   Breast abscess 07/28/2016   Hypothyroid 12/21/2014   Sleep apnea 12/21/2014   Snores 12/05/2013   Migraine headache 06/24/2007   Essential hypertension 06/24/2007   Closed fracture of metatarsal bone 06/24/2007    Past Medical History:  Diagnosis Date   Abdominal pain 2021   CT abd neg. suspect abd wall pain vs functional GI pain   Carpal tunnel syndrome    Cervical spondylosis    Depression    GERD (gastroesophageal reflux disease)    with mild esoph dysmotility and small sliding HH on barium esophagram 06/2022   History of MRSA infection    R hand soft tissue   Hyperlipidemia    Hypertension    Hypothyroidism    IFG (impaired fasting glucose)    A1c's 5.5-5.6% 2019-2022   Joint pain    Migraine syndrome    occasional   Obesity, Class II, BMI 35-39.9    OSA on CPAP 11/2013   severe; needs compliance monitoring->referred to neurologist ([facility])   Osteoarthritis    Knees, ankles, fingers, wrists, sometimes hips.  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    Family History  Problem Relation Age of Onset   Hypertension Mother    Cancer Mother  lung   Migraines Mother    Ulcers Mother    Hypertension Father    Migraines Father    Heart disease Father    Ulcers Father    Early death Sister    Diabetes Sister    Diabetes Paternal Grandmother    Kidney disease Paternal Grandfather        kidney failure   Heart attack Paternal Grandfather    Early death Sister    Obesity Sister    Hypertension Sister    Migraines Sister    Cancer Sister        ? cervical cancer   Multiple sclerosis Sister    Breast cancer Sister    Pseudochol deficiency Neg Hx    Malignant hyperthermia Neg Hx    Hypotension Neg Hx    Anesthesia problems  Neg Hx    Past Surgical History:  Procedure Laterality Date   APPENDECTOMY  6 yrs ago   Dr. Mavis   COLONOSCOPY  2012; 02/20/20   approx 2012, normal. 01/2020 polyp x 1->per Dr. Kristie recall 5-10 yrs   CYSTOSCOPY     for pain in side; normal   ESOPHAGOGASTRODUODENOSCOPY     This was done for dysphagia/regurgitation.  Esophageal stricture but no dilatation required.-->  Symptoms improved with daily PPI.   FOOT SURGERY Right 2017   dorsal tarsal exostectomy, endoscopic plantar fasciotomy, bunionectomy with screw and removal of toenail 2nd toe R foot.   GANGLION CYST EXCISION     polysomnogram  11/2013   severe OSA   THYROIDECTOMY  12/10/2011   Bx benign.  Enlarging goiter.  Procedure: THYROIDECTOMY;  Surgeon: Oneil DELENA Mavis, MD;  Location: [facility];  Service: General;  Laterality: N/A;  Total Thyroidectomy   TRANSTHORACIC ECHOCARDIOGRAM  05/17/2015   EF 65-70%, mod LVH, normal wall motion, grd I DD, mild MR.   UPPER GI ENDOSCOPY     Social History   Social History Narrative   Married, 3 step children.  She raised her nephew who has special needs.   Educ: masters deg   Occup: retired psychologist, prison and probation services   Rare alcohol.   No tob.   Immunization History  Administered Date(s) Administered   Influenza, Seasonal, Injecte, Preservative Fre 04/29/2023   Influenza,inj,Quad PF,6+ Mos 07/15/2017, 05/19/2019, 10/23/2021, 04/25/2022   Influenza-Unspecified 05/24/2020   Moderna Sars-Covid-2 Vaccination 10/28/2019, 11/30/2019, 08/09/2020   PNEUMOCOCCAL CONJUGATE-20 04/29/2023   Tdap 04/18/2021   Zoster Recombinant(Shingrix) 04/18/2021, 10/23/2021     Objective: Vital Signs: BP (!) 146/86 (BP Location: Right Arm, Patient Position: Sitting, Cuff Size: Large)   Pulse 78   Resp 14   Ht 5' 7 (1.702 m)   Wt 271 lb (122.9 kg)   BMI 42.44 kg/m    Physical Exam Constitutional:      Appearance: She is obese.  Eyes:     Conjunctiva/sclera: Conjunctivae normal.  Cardiovascular:      Rate and Rhythm: Normal rate and regular rhythm.  Pulmonary:     Effort: Pulmonary effort is normal.     Breath sounds: Normal breath sounds.  Lymphadenopathy:     Cervical: No cervical adenopathy.  Skin:    General: Skin is warm and dry.     Comments: Vitiligo Normal appearing nailfold capillaries  Neurological:     Mental Status: She is alert.  Psychiatric:        Mood and Affect: Mood normal.      Musculoskeletal Exam: Elbows full ROM no tenderness or swelling Wrists full ROM,  right wrist tenderness to percussion over carpal tunnel with radiation into hand Fingers full ROM, mild DIP nodules, no tenderness or swelling Right knee crepitus with full ROM, no tenderness to pressure Ankles full ROM no tenderness or swelling  Limited MSK US  exam showing right wrist median nerve cross-sectional area approximately 0.18 cm squared with flattening indicative of compression. Left wrist median nerve cross-sectional area approximately 0.14 cm squared.   Investigation: No additional findings.  Imaging: No results found.  Recent Labs: Lab Results  Component Value Date   WBC 7.1 06/18/2023   HGB 14.2 06/18/2023   PLT 188 06/18/2023   NA 141 06/18/2023   K 3.5 06/18/2023   CL 105 06/18/2023   CO2 21 06/18/2023   GLUCOSE 133 (H) 06/18/2023   BUN 11 06/18/2023   CREATININE 0.92 06/18/2023   BILITOT 1.0 06/18/2023   ALKPHOS 57 10/24/2022   AST 23 06/18/2023   ALT 27 06/18/2023   PROT 6.5 06/18/2023   ALBUMIN 4.4 10/24/2022   CALCIUM  9.5 06/18/2023   GFRAA >60 11/18/2018    Speciality Comments: No specialty comments available.  Procedures:  No procedures performed Allergies: Bactrim  [sulfamethoxazole -trimethoprim ], Erythromycin, and Iodinated contrast media   Assessment / Plan:     Visit Diagnoses: Positive ANA (antinuclear antibody) - Plan: Sedimentation rate, C3 and C4, RNP Antibody Unclear if this is causing symptoms or is an incidental finding. Exam findings very  consistent with moderate or worse CTS and generalized OA. -Rechecking RNP titer, checking ESR, complements  Carpal tunnel syndrome, right upper limb Significant swelling of the right nerve, with symptoms of numbness and tingling in the hands, particularly at night. -Recommend wearing a carpal tunnel brace at night to prevent wrist flexion and alleviate symptoms. -Unless alternate result findings, recommend nerve conduction study to quantify severity of nerve impingement. -Printed range of motion exercises for the hand and wrist.  Postoperative hypothyroidism Hypothyroidism, unspecified type - Plan: Thyroid  Peroxidase Antibodies (TPO) (REFL)  Osteoarthritis Mild degenerative changes noted, with occasional joint pain managed with Tylenol .  Sleep Apnea Currently managed with CPAP. -Continue current management.  Fatigue Depression History of depression managed with therapy and Buspar , currently not experiencing depressive symptoms. Does endorse anxiety possibly contributing to insomnia and fatigue. Previously took imipramine many years ago more for migraines. -Can f/u with primary care provider (Dr. Helon) for further evaluation and potential treatment for sleep and anxiety issues.  General Health Maintenance -Continue Vitamin D  supplementation    Orders: Orders Placed This Encounter  Procedures   Sedimentation rate   C3 and C4   Thyroid  Peroxidase Antibodies (TPO) (REFL)   RNP Antibody   No orders of the defined types were placed in this encounter.    Follow-Up Instructions: No follow-ups on file.   Lonni LELON Ester, MD  Note - This record has been created using [employer].  Chart creation errors have been sought, but may not always  have been located. Such creation errors do not reflect on  the standard of medical care.

## 2023-10-01 ENCOUNTER — Encounter: Payer: Self-pay | Admitting: Internal Medicine

## 2023-10-01 ENCOUNTER — Ambulatory Visit: Payer: Medicare HMO | Attending: Internal Medicine | Admitting: Internal Medicine

## 2023-10-01 VITALS — BP 146/86 | HR 78 | Resp 14 | Ht 67.0 in | Wt 271.0 lb

## 2023-10-01 DIAGNOSIS — K219 Gastro-esophageal reflux disease without esophagitis: Secondary | ICD-10-CM | POA: Diagnosis not present

## 2023-10-01 DIAGNOSIS — E785 Hyperlipidemia, unspecified: Secondary | ICD-10-CM | POA: Diagnosis not present

## 2023-10-01 DIAGNOSIS — E119 Type 2 diabetes mellitus without complications: Secondary | ICD-10-CM | POA: Diagnosis not present

## 2023-10-01 DIAGNOSIS — I2510 Atherosclerotic heart disease of native coronary artery without angina pectoris: Secondary | ICD-10-CM | POA: Diagnosis not present

## 2023-10-01 DIAGNOSIS — E039 Hypothyroidism, unspecified: Secondary | ICD-10-CM | POA: Diagnosis not present

## 2023-10-01 DIAGNOSIS — Z888 Allergy status to other drugs, medicaments and biological substances status: Secondary | ICD-10-CM | POA: Diagnosis not present

## 2023-10-01 DIAGNOSIS — E890 Postprocedural hypothyroidism: Secondary | ICD-10-CM | POA: Diagnosis not present

## 2023-10-01 DIAGNOSIS — F411 Generalized anxiety disorder: Secondary | ICD-10-CM | POA: Diagnosis not present

## 2023-10-01 DIAGNOSIS — I10 Essential (primary) hypertension: Secondary | ICD-10-CM | POA: Diagnosis not present

## 2023-10-01 DIAGNOSIS — G43909 Migraine, unspecified, not intractable, without status migrainosus: Secondary | ICD-10-CM | POA: Diagnosis not present

## 2023-10-01 DIAGNOSIS — J309 Allergic rhinitis, unspecified: Secondary | ICD-10-CM | POA: Diagnosis not present

## 2023-10-01 DIAGNOSIS — G5601 Carpal tunnel syndrome, right upper limb: Secondary | ICD-10-CM | POA: Diagnosis not present

## 2023-10-01 DIAGNOSIS — R768 Other specified abnormal immunological findings in serum: Secondary | ICD-10-CM

## 2023-10-01 DIAGNOSIS — Z8249 Family history of ischemic heart disease and other diseases of the circulatory system: Secondary | ICD-10-CM | POA: Diagnosis not present

## 2023-10-01 DIAGNOSIS — Z7984 Long term (current) use of oral hypoglycemic drugs: Secondary | ICD-10-CM | POA: Diagnosis not present

## 2023-10-01 DIAGNOSIS — Z809 Family history of malignant neoplasm, unspecified: Secondary | ICD-10-CM | POA: Diagnosis not present

## 2023-10-02 LAB — THYROID PEROXIDASE ANTIBODIES (TPO) (REFL): Thyroperoxidase Ab SerPl-aCnc: 2 [IU]/mL (ref <9)

## 2023-10-02 LAB — SEDIMENTATION RATE: Sed Rate: 2 mm/h (ref 0–30)

## 2023-10-02 LAB — C3 AND C4
C3 Complement: 182 mg/dL (ref 83–193)
C4 Complement: 37 mg/dL (ref 15–57)

## 2023-10-02 LAB — RNP ANTIBODY: Ribonucleic Protein(ENA) Antibody, IgG: 3.4 AI — AB

## 2023-10-06 ENCOUNTER — Other Ambulatory Visit: Payer: Self-pay | Admitting: Family Medicine

## 2023-10-07 ENCOUNTER — Telehealth: Payer: Self-pay

## 2023-10-07 NOTE — Telephone Encounter (Signed)
FYI what?-->I cannot see any info attached to this.

## 2023-10-07 NOTE — Telephone Encounter (Signed)
FYI

## 2023-10-08 NOTE — Telephone Encounter (Signed)
noted

## 2023-10-12 ENCOUNTER — Encounter: Payer: Self-pay | Admitting: Internal Medicine

## 2023-10-15 NOTE — Telephone Encounter (Signed)
Patient contacted the office requesting her lab results and what her next steps are.

## 2023-10-16 NOTE — Telephone Encounter (Signed)
 Now addressed in associated result note

## 2023-10-16 NOTE — Telephone Encounter (Signed)
Advised patient of lab results and recommendations. She is in agreement to proceed with NCV but states she had one several years ago with Dr. Santiago Glad who is a headache specialist. The patient states she was advised at that time, carpal tunnel was confirmed but severe enough for any intervention.  Would you like to refer patient for an updated NCV for comparison?

## 2023-10-16 NOTE — Progress Notes (Signed)
 RNP test was positive when repeated at 3.4 which is about the same. The other tests, sed rate and complement, for evidence of active inflammation were negative. She was also negative for the TPO antibodies that can cause a positive ANA associated with thyroid  disease. Overall I don't see specific evidence for an autoimmune disease. I recommend we refer her for a nerve conduction study of both arms as the next step. This looks for causes of pain and numbness such as carpal tunnel syndrome or neuropathy.

## 2023-10-22 NOTE — Telephone Encounter (Signed)
 I do recommend we recheck this problem because her current symptom complaints seem very consistent for this and her ultrasound exam suggested a significant swelling of the nerves.  And her hand numbness and discomfort has been significantly worse lately based on our visit. If there is moderately severe disease on repeat study I think she could benefit from a trial of ultrasound-guided wrist injection. If it is severe she would benefit with orthopedic consultation.

## 2023-10-22 NOTE — Telephone Encounter (Signed)
 Patient advised Dr.Rice does recommend we recheck this problem because her current symptom complaints seem very consistent for this and her ultrasound exam suggested a significant swelling of the nerves.  And her hand numbness and discomfort has been significantly worse lately based on our visit. If there is moderately severe disease on repeat study I think she could benefit from a trial of ultrasound-guided wrist injection. If it is severe she would benefit with orthopedic consultation.  Patient has scheduled an appointment for 11/03/2023 at 3:00 pm.

## 2023-10-23 NOTE — Patient Instructions (Signed)

## 2023-10-27 ENCOUNTER — Encounter: Payer: Medicare HMO | Admitting: Family Medicine

## 2023-10-27 DIAGNOSIS — G4733 Obstructive sleep apnea (adult) (pediatric): Secondary | ICD-10-CM | POA: Diagnosis not present

## 2023-10-28 ENCOUNTER — Ambulatory Visit: Payer: Medicare HMO | Admitting: Family Medicine

## 2023-10-28 VITALS — BP 129/84 | HR 73 | Temp 98.8°F | Ht 67.5 in | Wt 272.2 lb

## 2023-10-28 DIAGNOSIS — Z0000 Encounter for general adult medical examination without abnormal findings: Secondary | ICD-10-CM

## 2023-10-28 DIAGNOSIS — M2550 Pain in unspecified joint: Secondary | ICD-10-CM

## 2023-10-28 DIAGNOSIS — F411 Generalized anxiety disorder: Secondary | ICD-10-CM | POA: Diagnosis not present

## 2023-10-28 DIAGNOSIS — E2839 Other primary ovarian failure: Secondary | ICD-10-CM | POA: Diagnosis not present

## 2023-10-28 DIAGNOSIS — R7303 Prediabetes: Secondary | ICD-10-CM | POA: Diagnosis not present

## 2023-10-28 DIAGNOSIS — G5603 Carpal tunnel syndrome, bilateral upper limbs: Secondary | ICD-10-CM

## 2023-10-28 DIAGNOSIS — E890 Postprocedural hypothyroidism: Secondary | ICD-10-CM | POA: Diagnosis not present

## 2023-10-28 DIAGNOSIS — I10 Essential (primary) hypertension: Secondary | ICD-10-CM

## 2023-10-28 MED ORDER — ROSUVASTATIN CALCIUM 10 MG PO TABS: 10.0000 mg | ORAL_TABLET | Freq: Every day | ORAL | 1 refills | Status: DC

## 2023-10-28 MED ORDER — METFORMIN HCL 500 MG PO TABS: 500.0000 mg | ORAL_TABLET | Freq: Two times a day (BID) | ORAL | 1 refills | Status: DC

## 2023-10-28 MED ORDER — LEVOTHYROXINE SODIUM 112 MCG PO TABS: ORAL_TABLET | ORAL | 0 refills | Status: DC

## 2023-10-28 MED ORDER — LOSARTAN POTASSIUM-HCTZ 100-12.5 MG PO TABS: ORAL_TABLET | ORAL | 3 refills | Status: DC

## 2023-10-28 NOTE — Progress Notes (Signed)
 Office Note 10/28/2023  CC:  Chief Complaint  Patient presents with   Annual Exam    Pt is fasting   Patient is a 68 y.o. female who is here for annual health maintenance exam and follow-up prediabetes, hypertension.  She has significant generalized anxiety.   INTERIM HX: She saw Dr. Dimple Casey in rheumatology on 10/01/2023 for low-positive ANA in the setting of polyarthralgia. He did not see any specific evidence for an autoimmune disease.  He recommended referral for nerve conduction study of both arms to further evaluate fingers/hands numbness. Carpal tunnel syndrome and generalized osteoarthritis suspected.  She is not as anxious about her health lately.  Not as tired and achy. Blood pressures have been normal.  ROS as above, plus--> no fevers, no CP, no SOB, no wheezing, no cough, no dizziness, no HAs, no rashes, no melena/hematochezia.  No polyuria or polydipsia.    No focal weakness or tremors.  No acute vision or hearing abnormalities.  No dysuria or unusual/new urinary urgency or frequency.  No recent changes in lower legs. No n/v/d or abd pain.  No palpitations.    Past Medical History:  Diagnosis Date   Abdominal pain 2021   CT abd neg. suspect abd wall pain vs functional GI pain   Carpal tunnel syndrome    Cervical spondylosis    Depression    GERD (gastroesophageal reflux disease)    with mild esoph dysmotility and small sliding HH on barium esophagram 06/2022   History of MRSA infection    R hand soft tissue   Hyperlipidemia    Hypertension    Hypothyroidism    IFG (impaired fasting glucose)    A1c's 5.5-5.6% 2019-2022   Joint pain    Migraine syndrome    occasional   Obesity, Class II, BMI 35-39.9    OSA on CPAP 11/2013   severe; needs compliance monitoring->referred to neurologist ([facility])   Osteoarthritis    Knees, ankles, fingers, wrists, sometimes hips.  Occ prn NSAID   Postsurgical hypothyroidism 12/21/2014   Pre-diabetes    Vitamin D deficiency 04/2019    22 ng/ml Sept 29, 2020.    Past Surgical History:  Procedure Laterality Date   APPENDECTOMY  6 yrs ago   Dr. Lovell Sheehan   COLONOSCOPY  2012; 02/20/20   approx 2012, normal. 01/2020 polyp x 1->per Dr. Loreta Ave recall 5-10 yrs   CYSTOSCOPY     for pain in side; normal   ESOPHAGOGASTRODUODENOSCOPY     This was done for dysphagia/regurgitation.  Esophageal stricture but no dilatation required.-->  Symptoms improved with daily PPI.   FOOT SURGERY Right 2017   dorsal tarsal exostectomy, endoscopic plantar fasciotomy, bunionectomy with screw and removal of toenail 2nd toe R foot.   GANGLION CYST EXCISION     polysomnogram  11/2013   severe OSA   THYROIDECTOMY  12/10/2011   Bx benign.  Enlarging goiter.  Procedure: THYROIDECTOMY;  Surgeon: Dalia Heading, MD;  Location: [facility];  Service: General;  Laterality: N/A;  Total Thyroidectomy   TRANSTHORACIC ECHOCARDIOGRAM  05/17/2015   EF 65-70%, mod LVH, normal wall motion, grd I DD, mild MR.   UPPER GI ENDOSCOPY      Family History  Problem Relation Age of Onset   Hypertension Mother    Cancer Mother        lung   Migraines Mother    Ulcers Mother    Hypertension Father    Migraines Father    Heart disease Father  Ulcers Father    Early death Sister    Diabetes Sister    Diabetes Paternal Grandmother    Kidney disease Paternal Grandfather        kidney failure   Heart attack Paternal Grandfather    Early death Sister    Obesity Sister    Hypertension Sister    Migraines Sister    Cancer Sister        ? cervical cancer   Multiple sclerosis Sister    Breast cancer Sister    Pseudochol deficiency Neg Hx    Malignant hyperthermia Neg Hx    Hypotension Neg Hx    Anesthesia problems Neg Hx     Social History   Socioeconomic History   Marital status: Married    Spouse name: Not on file   Number of children: Not on file   Years of education: Not on file   Highest education level: Master's degree (e.g., MA, MS, MEng, MEd, MSW, MBA)   Occupational History   Not on file  Tobacco Use   Smoking status: Never    Passive exposure: Past   Smokeless tobacco: Never  Vaping Use   Vaping status: Never Used  Substance and Sexual Activity   Alcohol use: Not Currently    Comment: very rarely   Drug use: No   Sexual activity: Yes    Birth control/protection: Post-menopausal  Other Topics Concern   Not on file  Social History Narrative   Married, 3 step children.  She raised her nephew who has special needs.   Educ: masters deg   Occup: retired Psychologist, prison and probation services   Rare alcohol.   No tob.   Social Drivers of Corporate investment banker Strain: Low Risk  (10/28/2023)   Overall Financial Resource Strain (CARDIA)    Difficulty of Paying Living Expenses: Not hard at all  Food Insecurity: No Food Insecurity (10/28/2023)   Hunger Vital Sign    Worried About Running Out of Food in the Last Year: Never true    Ran Out of Food in the Last Year: Never true  Transportation Needs: No Transportation Needs (10/28/2023)   PRAPARE - Administrator, Civil Service (Medical): No    Lack of Transportation (Non-Medical): No  Physical Activity: Insufficiently Active (10/28/2023)   Exercise Vital Sign    Days of Exercise per Week: 1 day    Minutes of Exercise per Session: 10 min  Stress: Stress Concern Present (10/28/2023)   [employer] of Occupational Health - Occupational Stress Questionnaire    Feeling of Stress : To some extent  Social Connections: Socially Integrated (10/28/2023)   Social Connection and Isolation Panel [NHANES]    Frequency of Communication with Friends and Family: Three times a week    Frequency of Social Gatherings with Friends and Family: Twice a week    Attends Religious Services: More than 4 times per year    Active Member of [employer] or Organizations: Yes    Attends Engineer, structural: More than 4 times per year    Marital Status: Married  Catering manager Violence: Not At Risk  (09/04/2023)   Humiliation, Afraid, Rape, and Kick questionnaire    Fear of Current or Ex-Partner: No    Emotionally Abused: No    Physically Abused: No    Sexually Abused: No    Outpatient Medications Prior to Visit  Medication Sig Dispense Refill   acetaminophen (TYLENOL 8 HOUR) 650 MG CR tablet Take 650 mg  by mouth every 8 (eight) hours as needed for pain.     busPIRone (BUSPAR) 5 MG tablet Take 1 tablet (5 mg total) by mouth 2 (two) times daily. 60 tablet 6   eletriptan (RELPAX) 40 MG tablet Take 40 mg by mouth as needed. may repeat in 2 hours if necessary for migraines     levothyroxine (SYNTHROID) 112 MCG tablet TAKE (1) TABLET BY MOUTH EACH MORNING ON AN EMPTY STOMACH. 90 tablet 0   losartan-hydrochlorothiazide (HYZAAR) 100-12.5 MG tablet TAKE (1) TABLET BY MOUTH ONCE DAILY. 90 tablet 3   metFORMIN (GLUCOPHAGE) 500 MG tablet Take 1 tablet (500 mg total) by mouth 2 (two) times daily with a meal. 180 tablet 1   pantoprazole (PROTONIX) 40 MG tablet Take 40 mg by mouth daily.     rosuvastatin (CRESTOR) 10 MG tablet TAKE (1) TABLET BY MOUTH ONCE DAILY. 90 tablet 1   VITAMIN D PO Take by mouth daily.     No facility-administered medications prior to visit.    Allergies  Allergen Reactions   Bactrim [Sulfamethoxazole-Trimethoprim] Nausea And Vomiting   Erythromycin Hives    REACTION: rash on trunk of body   Iodinated Contrast Media Swelling and Other (See Comments)    Reaction: itching,swelling around the eyes     PE;    10/28/2023    9:46 AM 10/01/2023    8:08 AM 10/01/2023    7:54 AM  Vitals with BMI  Height 5' 7.5"  5\' 7"   Weight 272 lbs 3 oz  271 lbs  BMI 41.98  42.43  Systolic 129 146 161  Diastolic 84 86 85  Pulse 73 78 82   Gen: Alert, well appearing.  Patient is oriented to person, place, time, and situation. AFFECT: pleasant, lucid thought and speech. ENT: Ears: EACs clear, normal epithelium.  TMs with good light reflex and landmarks bilaterally.  Eyes: no  injection, icteris, swelling, or exudate.  EOMI, PERRLA. Nose: no drainage or turbinate edema/swelling.  No injection or focal lesion.  Mouth: lips without lesion/swelling.  Oral mucosa pink and moist.  Dentition intact and without obvious caries or gingival swelling.  Oropharynx without erythema, exudate, or swelling.  Neck: supple/nontender.  No LAD, mass, or TM.  Carotid pulses 2+ bilaterally, without bruits. CV: RRR, no m/r/g.   LUNGS: CTA bilat, nonlabored resps, good aeration in all lung fields. ABD: soft, NT, ND, BS normal.  No hepatospenomegaly or mass.  No bruits. EXT: no clubbing, cyanosis, or edema.  Musculoskeletal: no joint swelling, erythema, warmth, or tenderness.  ROM of all joints intact. Skin - no sores or suspicious lesions or rashes or color changes R hand/wrist positive phalen's and Tinel's. No intrinsic hand muscle weakness.  No thenar atrophy.  Pertinent labs:  Lab Results  Component Value Date   TSH 4.55 (H) 06/18/2023   TSH 4.70 (H) 06/18/2023   Lab Results  Component Value Date   WBC 7.1 06/18/2023   HGB 14.2 06/18/2023   HCT 42.2 06/18/2023   MCV 91.1 06/18/2023   PLT 188 06/18/2023   Lab Results  Component Value Date   CREATININE 0.92 06/18/2023   BUN 11 06/18/2023   NA 141 06/18/2023   K 3.5 06/18/2023   CL 105 06/18/2023   CO2 21 06/18/2023   Lab Results  Component Value Date   ALT 27 06/18/2023   AST 23 06/18/2023   ALKPHOS 57 10/24/2022   BILITOT 1.0 06/18/2023   Lab Results  Component Value Date   CHOL  163 04/29/2023   Lab Results  Component Value Date   HDL 67 04/29/2023   Lab Results  Component Value Date   LDLCALC 70 04/29/2023   Lab Results  Component Value Date   TRIG 182 (H) 04/29/2023   Lab Results  Component Value Date   CHOLHDL 2.4 04/29/2023   Lab Results  Component Value Date   HGBA1C 6.0 (H) 06/18/2023   Lab Results  Component Value Date   ESRSEDRATE 2 10/01/2023   ASSESSMENT AND PLAN:   No  problem-specific Assessment & Plan notes found for this encounter.  #1 health maintenance exam: Reviewed age and gender appropriate health maintenance issues (prudent diet, regular exercise, health risks of tobacco and excessive alcohol, use of seatbelts, fire alarms in home, use of sunscreen).  Also reviewed age and gender appropriate health screening as well as vaccine recommendations. Vaccines: ALL UTD. Labs: fasting HP + Hba1c ordered. Cervical ca screening: per GYN MD, UTD Jan 2025. Breast ca screening: per GYN MD-->she is current. Colon ca screening: recall 2026 Osteoporosis screening: Has never had DEXA.  Ordered this today.  2 prediabetes, doing well on metformin 500 mg twice a day. A1c and fasting glucose ordered today.  #3 hypertension, stable on losartan-HCTZ 100-12.51 tablet daily. Check electrolytes and creatinine today.  4.  Diffuse arthralgias. Low suspicion autoimmune disease per rheumatologist.  #5 generalized anxiety disorder. Doing well on BuSpar 5 mg twice daily.  #6 carpal tunnel syndrome bilaterally. Rheumatologist has referred her for nerve conduction studies and we will then see her for follow-up. Of note, bedside MSK ultrasound today showed right median nerve area 1.5 cm, left 1.1 cm.  An After Visit Summary was printed and given to the patient.  FOLLOW UP:  No follow-ups on file.  Signed:  Santiago Bumpers, MD           10/28/2023

## 2023-10-29 ENCOUNTER — Encounter: Payer: Self-pay | Admitting: Family Medicine

## 2023-10-29 LAB — CBC WITH DIFFERENTIAL/PLATELET
Absolute Lymphocytes: 1687 {cells}/uL (ref 850–3900)
Absolute Monocytes: 488 {cells}/uL (ref 200–950)
Basophils Absolute: 52 {cells}/uL (ref 0–200)
Basophils Relative: 0.7 %
Eosinophils Absolute: 289 {cells}/uL (ref 15–500)
Eosinophils Relative: 3.9 %
HCT: 43.7 % (ref 35.0–45.0)
Hemoglobin: 14.4 g/dL (ref 11.7–15.5)
MCH: 29.5 pg (ref 27.0–33.0)
MCHC: 33 g/dL (ref 32.0–36.0)
MCV: 89.5 fL (ref 80.0–100.0)
MPV: 10.6 fL (ref 7.5–12.5)
Monocytes Relative: 6.6 %
Neutro Abs: 4884 {cells}/uL (ref 1500–7800)
Neutrophils Relative %: 66 %
Platelets: 213 10*3/uL (ref 140–400)
RBC: 4.88 10*6/uL (ref 3.80–5.10)
RDW: 12.8 % (ref 11.0–15.0)
Total Lymphocyte: 22.8 %
WBC: 7.4 10*3/uL (ref 3.8–10.8)

## 2023-10-29 LAB — COMPREHENSIVE METABOLIC PANEL
AG Ratio: 2.1 (calc) (ref 1.0–2.5)
ALT: 25 U/L (ref 6–29)
AST: 22 U/L (ref 10–35)
Albumin: 4.7 g/dL (ref 3.6–5.1)
Alkaline phosphatase (APISO): 60 U/L (ref 37–153)
BUN: 13 mg/dL (ref 7–25)
CO2: 27 mmol/L (ref 20–32)
Calcium: 9.9 mg/dL (ref 8.6–10.4)
Chloride: 104 mmol/L (ref 98–110)
Creat: 0.93 mg/dL (ref 0.50–1.05)
Globulin: 2.2 g/dL (ref 1.9–3.7)
Glucose, Bld: 111 mg/dL — ABNORMAL HIGH (ref 65–99)
Potassium: 3.9 mmol/L (ref 3.5–5.3)
Sodium: 141 mmol/L (ref 135–146)
Total Bilirubin: 0.8 mg/dL (ref 0.2–1.2)
Total Protein: 6.9 g/dL (ref 6.1–8.1)

## 2023-10-29 LAB — HEMOGLOBIN A1C
Hgb A1c MFr Bld: 6.1 %{Hb} — ABNORMAL HIGH (ref <5.7)
Mean Plasma Glucose: 128 mg/dL
eAG (mmol/L): 7.1 mmol/L

## 2023-10-29 LAB — TSH: TSH: 3.78 m[IU]/L (ref 0.40–4.50)

## 2023-11-03 ENCOUNTER — Ambulatory Visit: Payer: Medicare HMO | Admitting: Internal Medicine

## 2023-11-03 NOTE — Progress Notes (Deleted)
 Office Visit Note  Patient: Theresa Young             Date of Birth: December 15, 1955           MRN: [id number]             PCP: Jeoffrey Massed, MD Referring: Jeoffrey Massed, MD Visit Date: 11/03/2023   Subjective:  No chief complaint on file.   History of Present Illness: Theresa Young is a 68 y.o. female here for follow up ***   Previous HPI 10/01/23 Theresa Young is a 68 y.o. female who presents for evaluation of abnormal lab results with positive ANA and RNP antibodies checked associated with joint pain and fatigue.   She experiences joint pain, particularly in her hands, with accompanying numbness and stinging sensations. These symptoms sometimes wake her at night, affecting her hands up to her elbows. She occasionally takes Tylenol for relief but not daily.    She has a history of abnormal lab results, including a positive ANA and RNP antibody at a low level. These findings have prompted further investigation to determine if there is an inflammatory process contributing to her symptoms.   She has a history of osteoarthritis, which has been previously diagnosed. Her weight has increased since her early thirties, but it has not previously limited her activities. However, in the last year and a half, she has experienced increased fatigue and joint pain, affecting her ability to perform daily tasks. She has been participating in a weight loss program healthy weight and wellness and was seeing some progress down to about 245 pounds in late 2023 but subsequently saw progressive worsening despite which she felt was a similar level of diet and exercise.   She has a history of sleep disturbances, including insomnia and fragmented sleep, despite using CPAP for obstructive sleep apnea. She usually falls asleep but will wake up again during the middle of the night.  She estimates that she sleeps between six and seven hours per night but feels exhausted after activities such as participating in  a farmer's market.   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   She has experienced gastrointestinal issues, including a small umbilical hernia and difficulty swallowing, which led to an upper GI test. She was prescribed pantoprazole for acid reflux, which was a change from her previous medication.   She has a history of migraines, which have significantly improved after treatment with imipramine in the past. She no longer experiences frequent migraines and does not regularly fill her prescriptions for migraine medication.   She reports a history of thyroid issues, having undergone a thyroidectomy after recurrent swelling and biopsies. Her thyroid levels have been stable on medication since the surgery.   She has experienced anxiety, particularly at night, over the past year, which she attributes to her health issues and changes in her life post-retirement. She has a history of situational depression related to family losses but does not currently feel depressed.    No Rheumatology ROS completed.   PMFS History:  Patient Active Problem List   Diagnosis Date Noted   Positive ANA (antinuclear antibody) 10/01/2023   Carpal tunnel syndrome, right upper limb 10/01/2023   Pain in both hands 05/11/2023   Stress at home 02/02/2023   BMI 39.0-39.9,adult 10/06/2022   Anxiety 09/02/2022   Umbilical hernia without obstruction and without gangrene 09/02/2022  Dysphagia 06/26/2022   Hypercholesterolemia 06/11/2022   Prediabetes 03/24/2022   Postoperative hypothyroidism 03/20/2022   Other fatigue 03/20/2022   SOB (shortness of breath) on exertion 03/20/2022   Impaired fasting glucose 03/20/2022   Vitamin D deficiency 03/20/2022   OSA on CPAP 03/20/2022   Health care maintenance 03/20/2022   Hypertriglyceridemia 03/20/2022   Essential hypertension 03/20/2022   Morbid obesity  (HCC) 03/20/2022   Vitiligo 10/23/2021   Gastroesophageal reflux disease 10/05/2020   Encounter for screening fecal occult blood testing 07/25/2020   Encounter for gynecological examination with Papanicolaou smear of cervix 07/25/2020   Encounter for well woman exam with routine gynecological exam 02/18/2018   Screening for colorectal cancer 02/18/2018   Encounter for routine gynecological examination with Papanicolaou smear of cervix 02/16/2017   Breast abscess 07/28/2016   Hypothyroid 12/21/2014   Sleep apnea 12/21/2014   Snores 12/05/2013   Migraine headache 06/24/2007   Essential hypertension 06/24/2007   Closed fracture of metatarsal bone 06/24/2007    Past Medical History:  Diagnosis Date   Abdominal pain 2021   CT abd neg. suspect abd wall pain vs functional GI pain   Carpal tunnel syndrome    Cervical spondylosis    Depression    GERD (gastroesophageal reflux disease)    with mild esoph dysmotility and small sliding HH on barium esophagram 06/2022   History of MRSA infection    R hand soft tissue   Hyperlipidemia    Hypertension    Hypothyroidism    IFG (impaired fasting glucose)    A1c's 5.5-5.6% 2019-2022   Joint pain    Migraine syndrome    occasional   Obesity, Class II, BMI 35-39.9    OSA on CPAP 11/2013   severe; needs compliance monitoring->referred to neurologist ([facility])   Osteoarthritis    Knees, ankles, fingers, wrists, sometimes hips.  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    Family History  Problem Relation Age of Onset   Hypertension Mother    Cancer Mother        lung   Migraines Mother    Ulcers Mother    Hypertension Father    Migraines Father    Heart disease Father    Ulcers Father    Early death Sister    Diabetes Sister    Diabetes Paternal Grandmother    Kidney disease Paternal Grandfather        kidney failure   Heart attack Paternal  Grandfather    Early death Sister    Obesity Sister    Hypertension Sister    Migraines Sister    Cancer Sister        ? cervical cancer   Multiple sclerosis Sister    Breast cancer Sister    Pseudochol deficiency Neg Hx    Malignant hyperthermia Neg Hx    Hypotension Neg Hx    Anesthesia problems Neg Hx    Past Surgical History:  Procedure Laterality Date   APPENDECTOMY  6 yrs ago   Dr. Lovell Sheehan   COLONOSCOPY  2012; 02/20/20   approx 2012, normal. 01/2020 polyp x 1->per Dr. Loreta Ave recall 5-10 yrs   CYSTOSCOPY     for pain in side; normal   ESOPHAGOGASTRODUODENOSCOPY     This was done for dysphagia/regurgitation.  Esophageal stricture but no dilatation required.-->  Symptoms improved with daily PPI.   FOOT SURGERY Right 2017  dorsal tarsal exostectomy, endoscopic plantar fasciotomy, bunionectomy with screw and removal of toenail 2nd toe R foot.   GANGLION CYST EXCISION     polysomnogram  11/2013   severe OSA   THYROIDECTOMY  12/10/2011   Bx benign.  Enlarging goiter.  Procedure: THYROIDECTOMY;  Surgeon: Dalia Heading, MD;  Location: [facility];  Service: General;  Laterality: N/A;  Total Thyroidectomy   TRANSTHORACIC ECHOCARDIOGRAM  05/17/2015   EF 65-70%, mod LVH, normal wall motion, grd I DD, mild MR.   UPPER GI ENDOSCOPY     Social History   Social History Narrative   Married, 3 step children.  She raised her nephew who has special needs.   Educ: masters deg   Occup: retired Psychologist, prison and probation services   Rare alcohol.   No tob.   Immunization History  Administered Date(s) Administered   Influenza, Seasonal, Injecte, Preservative Fre 04/29/2023   Influenza,inj,Quad PF,6+ Mos 07/15/2017, 05/19/2019, 10/23/2021, 04/25/2022   Influenza-Unspecified 05/24/2020   Moderna Covid-19 Fall Seasonal Vaccine 31yrs & older 06/13/2023   Moderna Sars-Covid-2 Vaccination 10/28/2019, 11/30/2019, 08/09/2020   PNEUMOCOCCAL CONJUGATE-20 04/29/2023   Tdap 04/18/2021   Zoster  Recombinant(Shingrix) 04/18/2021, 10/23/2021     Objective: Vital Signs: There were no vitals taken for this visit.   Physical Exam   Musculoskeletal Exam: ***  CDAI Exam: CDAI Score: -- Patient Global: --; Provider Global: -- Swollen: --; Tender: -- Joint Exam 11/03/2023   No joint exam has been documented for this visit   There is currently no information documented on the homunculus. Go to the Rheumatology activity and complete the homunculus joint exam.  Investigation: No additional findings.  Imaging: No results found.  Recent Labs: Lab Results  Component Value Date   WBC 7.4 10/28/2023   HGB 14.4 10/28/2023   PLT 213 10/28/2023   NA 141 10/28/2023   K 3.9 10/28/2023   CL 104 10/28/2023   CO2 27 10/28/2023   GLUCOSE 111 (H) 10/28/2023   BUN 13 10/28/2023   CREATININE 0.93 10/28/2023   BILITOT 0.8 10/28/2023   ALKPHOS 57 10/24/2022   AST 22 10/28/2023   ALT 25 10/28/2023   PROT 6.9 10/28/2023   ALBUMIN 4.4 10/24/2022   CALCIUM 9.9 10/28/2023   GFRAA >60 11/18/2018    Speciality Comments: No specialty comments available.  Procedures:  No procedures performed Allergies: Bactrim [sulfamethoxazole-trimethoprim], Erythromycin, and Iodinated contrast media   Assessment / Plan:     Visit Diagnoses: No diagnosis found.  ***  Orders: No orders of the defined types were placed in this encounter.  No orders of the defined types were placed in this encounter.    Follow-Up Instructions: No follow-ups on file.   Fuller Plan, MD  Note - This record has been created using [employer].  Chart creation errors have been sought, but may not always  have been located. Such creation errors do not reflect on  the standard of medical care.

## 2023-11-10 ENCOUNTER — Ambulatory Visit: Attending: Internal Medicine | Admitting: Internal Medicine

## 2023-11-10 ENCOUNTER — Encounter: Payer: Self-pay | Admitting: Internal Medicine

## 2023-11-10 ENCOUNTER — Ambulatory Visit (INDEPENDENT_AMBULATORY_CARE_PROVIDER_SITE_OTHER)

## 2023-11-10 ENCOUNTER — Ambulatory Visit

## 2023-11-10 VITALS — BP 131/81 | HR 64 | Resp 14 | Ht 67.0 in | Wt 274.0 lb

## 2023-11-10 DIAGNOSIS — M25532 Pain in left wrist: Secondary | ICD-10-CM

## 2023-11-10 DIAGNOSIS — G5601 Carpal tunnel syndrome, right upper limb: Secondary | ICD-10-CM

## 2023-11-10 DIAGNOSIS — M25531 Pain in right wrist: Secondary | ICD-10-CM

## 2023-11-10 MED ORDER — LIDOCAINE HCL 1 % IJ SOLN
1.0000 mL | INTRAMUSCULAR | Status: AC | PRN
  Administered 2023-11-10: 1 mL

## 2023-11-10 MED ORDER — TRIAMCINOLONE ACETONIDE 40 MG/ML IJ SUSP
40.0000 mg | INTRAMUSCULAR | Status: AC | PRN
  Administered 2023-11-10: 40 mg

## 2023-11-10 NOTE — Progress Notes (Deleted)
 Office Visit Note  Patient: Theresa Young             Date of Birth: 02/05/1956           MRN: [id number]             PCP: Jeoffrey Massed, MD Referring: Jeoffrey Massed, MD Visit Date: 11/10/2023   Subjective:  No chief complaint on file.   History of Present Illness: LEONILDA COZBY is a 68 y.o. female here for follow up ***   Previous HPI    Review of Systems  Constitutional:  Positive for fatigue.  HENT:  Negative for mouth sores and mouth dryness.   Eyes:  Negative for dryness.  Respiratory:  Positive for shortness of breath.   Cardiovascular:  Negative for chest pain and palpitations.  Gastrointestinal:  Positive for constipation and diarrhea. Negative for blood in stool.  Endocrine: Negative for increased urination.  Genitourinary:  Negative for involuntary urination.  Musculoskeletal:  Positive for joint pain, joint pain, myalgias, muscle weakness, morning stiffness and myalgias. Negative for gait problem, joint swelling and muscle tenderness.  Skin:  Positive for color change and sensitivity to sunlight. Negative for rash and hair loss.  Allergic/Immunologic: Negative for susceptible to infections.  Neurological:  Positive for headaches. Negative for dizziness.  Hematological:  Negative for swollen glands.  Psychiatric/Behavioral:  Positive for sleep disturbance. Negative for depressed mood. The patient is nervous/anxious.     PMFS History:  Patient Active Problem List   Diagnosis Date Noted   Positive ANA (antinuclear antibody) 10/01/2023   Carpal tunnel syndrome, right upper limb 10/01/2023   Pain in both hands 05/11/2023   Stress at home 02/02/2023   BMI 39.0-39.9,adult 10/06/2022   Anxiety 09/02/2022   Umbilical hernia without obstruction and without gangrene 09/02/2022   Dysphagia 06/26/2022   Hypercholesterolemia 06/11/2022   Prediabetes 03/24/2022   Postoperative hypothyroidism 03/20/2022   Other fatigue 03/20/2022   SOB (shortness of breath)  on exertion 03/20/2022   Impaired fasting glucose 03/20/2022   Vitamin D deficiency 03/20/2022   OSA on CPAP 03/20/2022   Health care maintenance 03/20/2022   Hypertriglyceridemia 03/20/2022   Essential hypertension 03/20/2022   Morbid obesity (HCC) 03/20/2022   Vitiligo 10/23/2021   Gastroesophageal reflux disease 10/05/2020   Encounter for screening fecal occult blood testing 07/25/2020   Encounter for gynecological examination with Papanicolaou smear of cervix 07/25/2020   Encounter for well woman exam with routine gynecological exam 02/18/2018   Screening for colorectal cancer 02/18/2018   Encounter for routine gynecological examination with Papanicolaou smear of cervix 02/16/2017   Breast abscess 07/28/2016   Hypothyroid 12/21/2014   Sleep apnea 12/21/2014   Snores 12/05/2013   Migraine headache 06/24/2007   Essential hypertension 06/24/2007   Closed fracture of metatarsal bone 06/24/2007    Past Medical History:  Diagnosis Date   Abdominal pain 2021   CT abd neg. suspect abd wall pain vs functional GI pain   Carpal tunnel syndrome    Cervical spondylosis    Depression    GERD (gastroesophageal reflux disease)    with mild esoph dysmotility and small sliding HH on barium esophagram 06/2022   History of MRSA infection    R hand soft tissue   Hyperlipidemia    Hypertension    Hypothyroidism    IFG (impaired fasting glucose)    A1c's 5.5-5.6% 2019-2022   Joint pain    Migraine syndrome    occasional   Obesity, Class II,  BMI 35-39.9    OSA on CPAP 11/2013   severe; needs compliance monitoring->referred to neurologist ([facility])   Osteoarthritis    Knees, ankles, fingers, wrists, sometimes hips.  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    Family History  Problem Relation Age of Onset   Hypertension Mother    Cancer Mother        lung   Migraines Mother    Ulcers Mother     Hypertension Father    Migraines Father    Heart disease Father    Ulcers Father    Early death Sister    Diabetes Sister    Diabetes Paternal Grandmother    Kidney disease Paternal Grandfather        kidney failure   Heart attack Paternal Grandfather    Early death Sister    Obesity Sister    Hypertension Sister    Migraines Sister    Cancer Sister        ? cervical cancer   Multiple sclerosis Sister    Breast cancer Sister    Pseudochol deficiency Neg Hx    Malignant hyperthermia Neg Hx    Hypotension Neg Hx    Anesthesia problems Neg Hx    Past Surgical History:  Procedure Laterality Date   APPENDECTOMY  6 yrs ago   Dr. Lovell Sheehan   COLONOSCOPY  2012; 02/20/20   approx 2012, normal. 01/2020 polyp x 1->per Dr. Loreta Ave recall 5-10 yrs   CYSTOSCOPY     for pain in side; normal   ESOPHAGOGASTRODUODENOSCOPY     This was done for dysphagia/regurgitation.  Esophageal stricture but no dilatation required.-->  Symptoms improved with daily PPI.   FOOT SURGERY Right 2017   dorsal tarsal exostectomy, endoscopic plantar fasciotomy, bunionectomy with screw and removal of toenail 2nd toe R foot.   GANGLION CYST EXCISION     polysomnogram  11/2013   severe OSA   THYROIDECTOMY  12/10/2011   Bx benign.  Enlarging goiter.  Procedure: THYROIDECTOMY;  Surgeon: Dalia Heading, MD;  Location: [facility];  Service: General;  Laterality: N/A;  Total Thyroidectomy   TRANSTHORACIC ECHOCARDIOGRAM  05/17/2015   EF 65-70%, mod LVH, normal wall motion, grd I DD, mild MR.   UPPER GI ENDOSCOPY     Social History   Social History Narrative   Married, 3 step children.  She raised her nephew who has special needs.   Educ: masters deg   Occup: retired Psychologist, prison and probation services   Rare alcohol.   No tob.   Immunization History  Administered Date(s) Administered   Influenza, Seasonal, Injecte, Preservative Fre 04/29/2023   Influenza,inj,Quad PF,6+ Mos 07/15/2017, 05/19/2019, 10/23/2021, 04/25/2022    Influenza-Unspecified 05/24/2020   Moderna Covid-19 Fall Seasonal Vaccine 5yrs & older 06/13/2023   Moderna Sars-Covid-2 Vaccination 10/28/2019, 11/30/2019, 08/09/2020   PNEUMOCOCCAL CONJUGATE-20 04/29/2023   Tdap 04/18/2021   Zoster Recombinant(Shingrix) 04/18/2021, 10/23/2021     Objective: Vital Signs: There were no vitals taken for this visit.   Physical Exam   Musculoskeletal Exam: ***  CDAI Exam: CDAI Score: -- Patient Global: --; Provider Global: -- Swollen: --; Tender: -- Joint Exam 11/10/2023   No joint exam has been documented for this visit   There is currently no information documented on the homunculus. Go to the Rheumatology activity and complete the homunculus joint exam.  Investigation: No additional findings.  Imaging: No  results found.  Recent Labs: Lab Results  Component Value Date   WBC 7.4 10/28/2023   HGB 14.4 10/28/2023   PLT 213 10/28/2023   NA 141 10/28/2023   K 3.9 10/28/2023   CL 104 10/28/2023   CO2 27 10/28/2023   GLUCOSE 111 (H) 10/28/2023   BUN 13 10/28/2023   CREATININE 0.93 10/28/2023   BILITOT 0.8 10/28/2023   ALKPHOS 57 10/24/2022   AST 22 10/28/2023   ALT 25 10/28/2023   PROT 6.9 10/28/2023   ALBUMIN 4.4 10/24/2022   CALCIUM 9.9 10/28/2023   GFRAA >60 11/18/2018    Speciality Comments: No specialty comments available.  Procedures:  No procedures performed Allergies: Bactrim [sulfamethoxazole-trimethoprim], Erythromycin, and Iodinated contrast media   Assessment / Plan:     Visit Diagnoses: No diagnosis found.  ***  Orders: No orders of the defined types were placed in this encounter.  No orders of the defined types were placed in this encounter.    Follow-Up Instructions: No follow-ups on file.   Metta Clines, RT  Note - This record has been created using [employer].  Chart creation errors have been sought, but may not always  have been located. Such creation errors do not reflect on  the  standard of medical care.

## 2023-11-10 NOTE — Progress Notes (Signed)
 Office Visit Note  Patient: Theresa Young             Date of Birth: Dec 09, 1955           MRN: [id number]             PCP: Jeoffrey Massed, MD Referring: Jeoffrey Massed, MD Visit Date: 11/10/2023   Subjective:  Follow-up   History of Present Illness: Theresa Young is a 68 y.o. female here for follow up with ongoing pain and numbness symptoms affecting her hands, worse on the right side. Testing at our initial visit demonstrated persistent low positive RNP but otherwise reassuring. She has seen only limited response with conservative treatment splinting and stretching.  Previous HPI 10/01/23 [name] is a 68 y.o. female who presents for evaluation of abnormal lab results with positive ANA and RNP antibodies checked associated with joint pain and fatigue.   She experiences joint pain, particularly in her hands, with accompanying numbness and stinging sensations. These symptoms sometimes wake her at night, affecting her hands up to her elbows. She occasionally takes Tylenol for relief but not daily.    She has a history of abnormal lab results, including a positive ANA and RNP antibody at a low level. These findings have prompted further investigation to determine if there is an inflammatory process contributing to her symptoms.   She has a history of osteoarthritis, which has been previously diagnosed. Her weight has increased since her early thirties, but it has not previously limited her activities. However, in the last year and a half, she has experienced increased fatigue and joint pain, affecting her ability to perform daily tasks. She has been participating in a weight loss program healthy weight and wellness and was seeing some progress down to about 245 pounds in late 2023 but subsequently saw progressive worsening despite which she felt was a similar level of diet and exercise.   She has a history of sleep disturbances, including insomnia and fragmented sleep, despite  using CPAP for obstructive sleep apnea. She usually falls asleep but will wake up again during the middle of the night.  She estimates that she sleeps between six and seven hours per night but feels exhausted after activities such as participating in a farmer's market.   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   She has experienced gastrointestinal issues, including a small umbilical hernia and difficulty swallowing, which led to an upper GI test. She was prescribed pantoprazole for acid reflux, which was a change from her previous medication.   She has a history of migraines, which have significantly improved after treatment with imipramine in the past. She no longer experiences frequent migraines and does not regularly fill her prescriptions for migraine medication.   She reports a history of thyroid issues, having undergone a thyroidectomy after recurrent swelling and biopsies. Her thyroid levels have been stable on medication since the surgery.   She has experienced anxiety, particularly at night, over the past year, which she attributes to her health issues and changes in her life post-retirement. She has a history of situational depression related to family losses but does not currently feel depressed.    Review of Systems  Musculoskeletal:  Negative for joint swelling.  Skin:  Negative for rash.  Neurological:  Positive for parasthesias. Negative for headaches.    PMFS History:  Patient Active Problem  List   Diagnosis Date Noted   Positive ANA (antinuclear antibody) 10/01/2023   Carpal tunnel syndrome, right upper limb 10/01/2023   Pain in both hands 05/11/2023   Stress at home 02/02/2023   BMI 39.0-39.9,adult 10/06/2022   Anxiety 09/02/2022   Umbilical hernia without obstruction and without gangrene 09/02/2022   Dysphagia 06/26/2022   Hypercholesterolemia  06/11/2022   Prediabetes 03/24/2022   Postoperative hypothyroidism 03/20/2022   Other fatigue 03/20/2022   SOB (shortness of breath) on exertion 03/20/2022   Impaired fasting glucose 03/20/2022   Vitamin D deficiency 03/20/2022   OSA on CPAP 03/20/2022   Health care maintenance 03/20/2022   Hypertriglyceridemia 03/20/2022   Essential hypertension 03/20/2022   Morbid obesity (HCC) 03/20/2022   Vitiligo 10/23/2021   Gastroesophageal reflux disease 10/05/2020   Encounter for screening fecal occult blood testing 07/25/2020   Encounter for gynecological examination with Papanicolaou smear of cervix 07/25/2020   Encounter for well woman exam with routine gynecological exam 02/18/2018   Screening for colorectal cancer 02/18/2018   Encounter for routine gynecological examination with Papanicolaou smear of cervix 02/16/2017   Breast abscess 07/28/2016   Hypothyroid 12/21/2014   Sleep apnea 12/21/2014   Snores 12/05/2013   Migraine headache 06/24/2007   Essential hypertension 06/24/2007   Closed fracture of metatarsal bone 06/24/2007    Past Medical History:  Diagnosis Date   Abdominal pain 2021   CT abd neg. suspect abd wall pain vs functional GI pain   Carpal tunnel syndrome    Cervical spondylosis    Depression    GERD (gastroesophageal reflux disease)    with mild esoph dysmotility and small sliding HH on barium esophagram 06/2022   History of MRSA infection    R hand soft tissue   Hyperlipidemia    Hypertension    Hypothyroidism    IFG (impaired fasting glucose)    A1c's 5.5-5.6% 2019-2022   Joint pain    Migraine syndrome    occasional   Obesity, Class II, BMI 35-39.9    OSA on CPAP 11/2013   severe; needs compliance monitoring->referred to neurologist ([facility])   Osteoarthritis    Knees, ankles, fingers, wrists, sometimes hips.  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    Family  History  Problem Relation Age of Onset   Hypertension Mother    Cancer Mother        lung   Migraines Mother    Ulcers Mother    Hypertension Father    Migraines Father    Heart disease Father    Ulcers Father    Early death Sister    Diabetes Sister    Diabetes Paternal Grandmother    Kidney disease Paternal Grandfather        kidney failure   Heart attack Paternal Grandfather    Early death Sister    Obesity Sister    Hypertension Sister    Migraines Sister    Cancer Sister        ? cervical cancer   Multiple sclerosis Sister    Breast cancer Sister    Pseudochol deficiency Neg Hx    Malignant hyperthermia Neg Hx    Hypotension Neg Hx    Anesthesia problems Neg Hx    Past Surgical History:  Procedure Laterality Date   APPENDECTOMY  6 yrs ago   Dr. Lovell Sheehan   COLONOSCOPY  2012; 02/20/20  approx 2012, normal. 01/2020 polyp x 1->per Dr. Loreta Ave recall 5-10 yrs   CYSTOSCOPY     for pain in side; normal   ESOPHAGOGASTRODUODENOSCOPY     This was done for dysphagia/regurgitation.  Esophageal stricture but no dilatation required.-->  Symptoms improved with daily PPI.   FOOT SURGERY Right 2017   dorsal tarsal exostectomy, endoscopic plantar fasciotomy, bunionectomy with screw and removal of toenail 2nd toe R foot.   GANGLION CYST EXCISION     polysomnogram  11/2013   severe OSA   THYROIDECTOMY  12/10/2011   Bx benign.  Enlarging goiter.  Procedure: THYROIDECTOMY;  Surgeon: Dalia Heading, MD;  Location: [facility];  Service: General;  Laterality: N/A;  Total Thyroidectomy   TRANSTHORACIC ECHOCARDIOGRAM  05/17/2015   EF 65-70%, mod LVH, normal wall motion, grd I DD, mild MR.   UPPER GI ENDOSCOPY     Social History   Social History Narrative   Married, 3 step children.  She raised her nephew who has special needs.   Educ: masters deg   Occup: retired Psychologist, prison and probation services   Rare alcohol.   No tob.   Immunization History  Administered Date(s) Administered   Influenza,  Seasonal, Injecte, Preservative Fre 04/29/2023   Influenza,inj,Quad PF,6+ Mos 07/15/2017, 05/19/2019, 10/23/2021, 04/25/2022   Influenza-Unspecified 05/24/2020   Moderna Covid-19 Fall Seasonal Vaccine 71yrs & older 06/13/2023   Moderna Sars-Covid-2 Vaccination 10/28/2019, 11/30/2019, 08/09/2020   PNEUMOCOCCAL CONJUGATE-20 04/29/2023   Tdap 04/18/2021   Zoster Recombinant(Shingrix) 04/18/2021, 10/23/2021     Objective: Vital Signs: BP 131/81 (BP Location: Left Arm, Patient Position: Sitting, Cuff Size: Large)   Pulse 64   Resp 14   Ht 5\' 7"  (1.702 m)   Wt 274 lb (124.3 kg)   BMI 42.91 kg/m    Physical Exam Constitutional:      Appearance: She is obese.  Musculoskeletal:     Right lower leg: No edema.     Left lower leg: No edema.  Skin:    General: Skin is warm and dry.     Findings: No rash.  Neurological:     Mental Status: She is alert.  Psychiatric:        Mood and Affect: Mood normal.      Musculoskeletal Exam:  Elbows full ROM no tenderness or swelling Wrists full ROM, right>left wrist tenderness to percussion over carpal tunnel with radiation into hand, pain with full right wrist flexion, no palpable swelling Fingers full ROM, mild DIP nodules, no tenderness or swelling   Investigation: No additional findings.  Imaging: US Guided Needle Placement Result Date: 11/10/2023 Left wrist carpal tunnel injection Ultrasound guided injection is preferred based studies that show increased duration, increased effect, greater accuracy, decreased procedural pain, increased response rate, and decreased cost with ultrasound guided versus blind injection. Verbal informed consent obtained.  Time-out conducted.  Noted no overlying erythema, induration, or other signs of local infection. Ultrasound-guided left wrist carpal tunnel injection. After sterile prep with Betadine, injected 1 mL 1% lidocaine and 40 mg kenalog using a 27g needle by radial approach. Images series 1 images 1-4  shows advancement of needle next to median nerve. Image series 2 images 1-3 shows injection of medication into carpal tunnel space.   US Guided Needle Placement Result Date: 11/10/2023 Right wrist carpal tunnel injection Ultrasound guided injection is preferred based studies that show increased duration, increased effect, greater accuracy, decreased procedural pain, increased response rate, and decreased cost with ultrasound guided versus blind injection. Verbal  informed consent obtained.  Time-out conducted.  Noted no overlying erythema, induration, or other signs of local infection. Ultrasound-guided right wrist carpal tunnel injection. After sterile prep with Betadine, injected 1 mL 1% lidocaine and 40 mg kenalog using a 27g needle by ulnar approach. Images 1-3 show advancement of needle deep to median nerve. Images 4-7 shows injection of medication into carpal tunnel space. Images 8-11 show completion of injection after reposition more proximal to median nerve.   Recent Labs: Lab Results  Component Value Date   WBC 7.4 10/28/2023   HGB 14.4 10/28/2023   PLT 213 10/28/2023   NA 141 10/28/2023   K 3.9 10/28/2023   CL 104 10/28/2023   CO2 27 10/28/2023   GLUCOSE 111 (H) 10/28/2023   BUN 13 10/28/2023   CREATININE 0.93 10/28/2023   BILITOT 0.8 10/28/2023   ALKPHOS 57 10/24/2022   AST 22 10/28/2023   ALT 25 10/28/2023   PROT 6.9 10/28/2023   ALBUMIN 4.4 10/24/2022   CALCIUM 9.9 10/28/2023   GFRAA >60 11/18/2018    Speciality Comments: No specialty comments available.  Procedures:  Hand/UE Inj: L carpal tunnel for carpal tunnel syndrome on 11/10/2023 12:19 PM Indications: pain and tendon swelling Details: 27 G needle, ultrasound-guided radial approach Medications: 1 mL lidocaine 1 %; 40 mg triamcinolone acetonide 40 MG/ML Outcome: tolerated well, no immediate complications Procedure, treatment alternatives, risks and benefits explained, specific risks discussed. Consent was given by  the patient. Immediately prior to procedure a time out was called to verify the correct patient, procedure, equipment, support staff and site/side marked as required. Patient was prepped and draped in the usual sterile fashion.    Hand/UE Inj: R carpal tunnel for carpal tunnel syndrome on 11/10/2023 12:20 PM Indications: pain and tendon swelling Details: 27 G needle, ulnar approach Medications: 1 mL lidocaine 1 %; 40 mg triamcinolone acetonide 40 MG/ML Outcome: tolerated well, no immediate complications Procedure, treatment alternatives, risks and benefits explained, specific risks discussed. Consent was given by the patient. Immediately prior to procedure a time out was called to verify the correct patient, procedure, equipment, support staff and site/side marked as required. Patient was prepped and draped in the usual sterile fashion.     Allergies: Bactrim [sulfamethoxazole-trimethoprim], Erythromycin, and Iodinated contrast media   Assessment / Plan:     Visit Diagnoses: Carpal tunnel syndrome, right upper limb Bilateral wrist pain - Plan: US Guided Needle Placement, US Guided Needle Placement Symptoms confirmed by ultrasound and physical exam and has known history of mild disease on previous NCS studies. No evidence of severe nerve damage and no objective strength deficits. We discussed possibility of activity reduction for symptoms but I think she is a good candidate for trying local injection treatment before accepting functional limitation or surgical evaluation. Symptoms have not resolved to conservative treatment so far. - Perform ultrasound-guided steroid injection in carpal tunnel space. - Advise continuation of normal activities post-injection. - Discuss potential symptom improvement and further investigation if needed. - Encourage activities for social interaction.     Orders: Orders Placed This Encounter  Procedures   Hand/UE Inj   Hand/UE Inj   US Guided Needle Placement    US Guided Needle Placement   No orders of the defined types were placed in this encounter.    Follow-Up Instructions: Return in about 3 months (around 02/10/2024), or if symptoms worsen or fail to improve, for CTS b/l inj f/u PRN.   Fuller Plan, MD  Note - This record  has been created using [employer].  Chart creation errors have been sought, but may not always  have been located. Such creation errors do not reflect on  the standard of medical care.

## 2023-11-25 ENCOUNTER — Ambulatory Visit (INDEPENDENT_AMBULATORY_CARE_PROVIDER_SITE_OTHER)

## 2023-11-25 DIAGNOSIS — Z0000 Encounter for general adult medical examination without abnormal findings: Secondary | ICD-10-CM

## 2023-11-25 NOTE — Patient Instructions (Signed)
 Theresa Young , Thank you for taking time to come for your Medicare Wellness Visit. I appreciate your ongoing commitment to your health goals. Please review the following plan we discussed and let me know if I can assist you in the future.   Screening recommendations/referrals: Colonoscopy: up to date Mammogram: up to date Bone Density: ordered Recommended yearly ophthalmology/optometry visit for glaucoma screening and checkup Recommended yearly dental visit for hygiene and checkup  Vaccinations: Influenza vaccine:  Pneumococcal vaccine:  Tdap vaccine:  Shingles vaccine:        Preventive Care 65 Years and Older, Female Preventive care refers to lifestyle choices and visits with your health care provider that can promote health and wellness. What does preventive care include? A yearly physical exam. This is also called an annual well check. Dental exams once or twice a year. Routine eye exams. Ask your health care provider how often you should have your eyes checked. Personal lifestyle choices, including: Daily care of your teeth and gums. Regular physical activity. Eating a healthy diet. Avoiding tobacco and drug use. Limiting alcohol use. Practicing safe sex. Taking low-dose aspirin every day. Taking vitamin and mineral supplements as recommended by your health care provider. What happens during an annual well check? The services and screenings done by your health care provider during your annual well check will depend on your age, overall health, lifestyle risk factors, and family history of disease. Counseling  Your health care provider may ask you questions about your: Alcohol use. Tobacco use. Drug use. Emotional well-being. Home and relationship well-being. Sexual activity. Eating habits. History of falls. Memory and ability to understand (cognition). Work and work Astronomer. Reproductive health. Screening  You may have the following tests or  measurements: Height, weight, and BMI. Blood pressure. Lipid and cholesterol levels. These may be checked every 5 years, or more frequently if you are over 98 years old. Skin check. Lung cancer screening. You may have this screening every year starting at age 3 if you have a 30-pack-year history of smoking and currently smoke or have quit within the past 15 years. Fecal occult blood test (FOBT) of the stool. You may have this test every year starting at age 82. Flexible sigmoidoscopy or colonoscopy. You may have a sigmoidoscopy every 5 years or a colonoscopy every 10 years starting at age 20. Hepatitis C blood test. Hepatitis B blood test. Sexually transmitted disease (STD) testing. Diabetes screening. This is done by checking your blood sugar (glucose) after you have not eaten for a while (fasting). You may have this done every 1-3 years. Bone density scan. This is done to screen for osteoporosis. You may have this done starting at age 30. Mammogram. This may be done every 1-2 years. Talk to your health care provider about how often you should have regular mammograms. Talk with your health care provider about your test results, treatment options, and if necessary, the need for more tests. Vaccines  Your health care provider may recommend certain vaccines, such as: Influenza vaccine. This is recommended every year. Tetanus, diphtheria, and acellular pertussis (Tdap, Td) vaccine. You may need a Td booster every 10 years. Zoster vaccine. You may need this after age 78. Pneumococcal 13-valent conjugate (PCV13) vaccine. One dose is recommended after age 31. Pneumococcal polysaccharide (PPSV23) vaccine. One dose is recommended after age 16. Talk to your health care provider about which screenings and vaccines you need and how often you need them. This information is not intended to replace advice given to  you by your health care provider. Make sure you discuss any questions you have with your  health care provider. Document Released: 09/07/2015 Document Revised: 04/30/2016 Document Reviewed: 06/12/2015 Elsevier Interactive Patient Education  2017 [employer].  Fall Prevention in the Home Falls can cause injuries. They can happen to people of all ages. There are many things you can do to make your home safe and to help prevent falls. What can I do on the outside of my home? Regularly fix the edges of walkways and driveways and fix any cracks. Remove anything that might make you trip as you walk through a door, such as a raised step or threshold. Trim any bushes or trees on the path to your home. Use bright outdoor lighting. Clear any walking paths of anything that might make someone trip, such as rocks or tools. Regularly check to see if handrails are loose or broken. Make sure that both sides of any steps have handrails. Any raised decks and porches should have guardrails on the edges. Have any leaves, snow, or ice cleared regularly. Use sand or salt on walking paths during winter. Clean up any spills in your garage right away. This includes oil or grease spills. What can I do in the bathroom? Use night lights. Install grab bars by the toilet and in the tub and shower. Do not use towel bars as grab bars. Use non-skid mats or decals in the tub or shower. If you need to sit down in the shower, use a plastic, non-slip stool. Keep the floor dry. Clean up any water that spills on the floor as soon as it happens. Remove soap buildup in the tub or shower regularly. Attach bath mats securely with double-sided non-slip rug tape. Do not have throw rugs and other things on the floor that can make you trip. What can I do in the bedroom? Use night lights. Make sure that you have a light by your bed that is easy to reach. Do not use any sheets or blankets that are too big for your bed. They should not hang down onto the floor. Have a firm chair that has side arms. You can use this for  support while you get dressed. Do not have throw rugs and other things on the floor that can make you trip. What can I do in the kitchen? Clean up any spills right away. Avoid walking on wet floors. Keep items that you use a lot in easy-to-reach places. If you need to reach something above you, use a strong step stool that has a grab bar. Keep electrical cords out of the way. Do not use floor polish or wax that makes floors slippery. If you must use wax, use non-skid floor wax. Do not have throw rugs and other things on the floor that can make you trip. What can I do with my stairs? Do not leave any items on the stairs. Make sure that there are handrails on both sides of the stairs and use them. Fix handrails that are broken or loose. Make sure that handrails are as long as the stairways. Check any carpeting to make sure that it is firmly attached to the stairs. Fix any carpet that is loose or worn. Avoid having throw rugs at the top or bottom of the stairs. If you do have throw rugs, attach them to the floor with carpet tape. Make sure that you have a light switch at the top of the stairs and the bottom of the stairs.  If you do not have them, ask someone to add them for you. What else can I do to help prevent falls? Wear shoes that: Do not have high heels. Have rubber bottoms. Are comfortable and fit you well. Are closed at the toe. Do not wear sandals. If you use a stepladder: Make sure that it is fully opened. Do not climb a closed stepladder. Make sure that both sides of the stepladder are locked into place. Ask someone to hold it for you, if possible. Clearly mark and make sure that you can see: Any grab bars or handrails. First and last steps. Where the edge of each step is. Use tools that help you move around (mobility aids) if they are needed. These include: Canes. Walkers. Scooters. Crutches. Turn on the lights when you go into a dark area. Replace any light bulbs as soon  as they burn out. Set up your furniture so you have a clear path. Avoid moving your furniture around. If any of your floors are uneven, fix them. If there are any pets around you, be aware of where they are. Review your medicines with your doctor. Some medicines can make you feel dizzy. This can increase your chance of falling. Ask your doctor what other things that you can do to help prevent falls. This information is not intended to replace advice given to you by your health care provider. Make sure you discuss any questions you have with your health care provider. Document Released: 06/07/2009 Document Revised: 01/17/2016 Document Reviewed: 09/15/2014 Elsevier Interactive Patient Education  2017 [employer].

## 2023-11-25 NOTE — Progress Notes (Signed)
 Subjective:   Theresa Young is a 68 y.o. female who presents for an Initial Medicare Annual Wellness Visit.  Visit Complete: Virtual I connected with  Theresa Young on 11/25/23 by a audio enabled telemedicine application and verified that I am speaking with the correct person using two identifiers.  Patient Location: Home  Provider Location: Home Office  I discussed the limitations of evaluation and management by telemedicine. The patient expressed understanding and agreed to proceed.  Vital Signs: Because this visit was a virtual/telehealth visit, some criteria may be missing or patient reported. Any vitals not documented were not able to be obtained and vitals that have been documented are patient reported. .  Cardiac Risk Factors include: advanced age (>6men, >83 women);hypertension;obesity (BMI >30kg/m2)     Objective:    There were no vitals filed for this visit. There is no height or weight on file to calculate BMI.     11/25/2023    3:56 PM 11/18/2018    4:40 PM 07/28/2016   11:07 AM 12/10/2011   12:13 PM 12/10/2011    8:40 AM 12/03/2011    2:09 PM  Advanced Directives  Does Patient Have a Medical Advance Directive? Yes No;Yes Yes Patient has advance directive, copy not in chart  Patient has advance directive, copy not in chart  Type of Advance Directive Healthcare Power of [employer] Power of [place];Living will  Healthcare Power of [employer] Power of Attorney Living will;Healthcare Power of Attorney  Copy of Healthcare Power of Attorney in Chart? Yes - validated most recent copy scanned in chart (See row information) No - copy requested  Copy requested from family  Copy requested from family  Would patient like information on creating a medical advance directive?  No - Patient declined      Pre-existing out of facility DNR order (yellow form or pink MOST form)    No      Current Medications (verified) Outpatient Encounter Medications as of 11/25/2023   Medication Sig   acetaminophen (TYLENOL 8 HOUR) 650 MG CR tablet Take 650 mg by mouth every 8 (eight) hours as needed for pain.   busPIRone (BUSPAR) 5 MG tablet Take 1 tablet (5 mg total) by mouth 2 (two) times daily.   eletriptan (RELPAX) 40 MG tablet Take 40 mg by mouth as needed. may repeat in 2 hours if necessary for migraines   levothyroxine (SYNTHROID) 112 MCG tablet TAKE (1) TABLET BY MOUTH EACH MORNING ON AN EMPTY STOMACH.   losartan-hydrochlorothiazide (HYZAAR) 100-12.5 MG tablet TAKE (1) TABLET BY MOUTH ONCE DAILY.   metFORMIN (GLUCOPHAGE) 500 MG tablet Take 1 tablet (500 mg total) by mouth 2 (two) times daily with a meal.   pantoprazole (PROTONIX) 40 MG tablet Take 40 mg by mouth daily.   rosuvastatin (CRESTOR) 10 MG tablet Take 1 tablet (10 mg total) by mouth daily.   VITAMIN D PO Take by mouth daily.   No facility-administered encounter medications on file as of 11/25/2023.    Allergies (verified) Bactrim [sulfamethoxazole-trimethoprim], Erythromycin, and Iodinated contrast media   History: Past Medical History:  Diagnosis Date   Abdominal pain 2021   CT abd neg. suspect abd wall pain vs functional GI pain   Carpal tunnel syndrome    Cervical spondylosis    Depression    GERD (gastroesophageal reflux disease)    with mild esoph dysmotility and small sliding HH on barium esophagram 06/2022   History of MRSA infection    R hand soft  tissue   Hyperlipidemia    Hypertension    Hypothyroidism    IFG (impaired fasting glucose)    A1c's 5.5-5.6% 2019-2022   Joint pain    Migraine syndrome    occasional   Obesity, Class II, BMI 35-39.9    OSA on CPAP 11/2013   severe; needs compliance monitoring->referred to neurologist ([facility])   Osteoarthritis    Knees, ankles, fingers, wrists, sometimes hips.  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   Past Surgical History:  Procedure  Laterality Date   APPENDECTOMY  6 yrs ago   Dr. Lovell Sheehan   COLONOSCOPY  2012; 02/20/20   approx 2012, normal. 01/2020 polyp x 1->per Dr. Loreta Ave recall 5-10 yrs   CYSTOSCOPY     for pain in side; normal   ESOPHAGOGASTRODUODENOSCOPY     This was done for dysphagia/regurgitation.  Esophageal stricture but no dilatation required.-->  Symptoms improved with daily PPI.   FOOT SURGERY Right 2017   dorsal tarsal exostectomy, endoscopic plantar fasciotomy, bunionectomy with screw and removal of toenail 2nd toe R foot.   GANGLION CYST EXCISION     polysomnogram  11/2013   severe OSA   THYROIDECTOMY  12/10/2011   Bx benign.  Enlarging goiter.  Procedure: THYROIDECTOMY;  Surgeon: Dalia Heading, MD;  Location: [facility];  Service: General;  Laterality: N/A;  Total Thyroidectomy   TRANSTHORACIC ECHOCARDIOGRAM  05/17/2015   EF 65-70%, mod LVH, normal wall motion, grd I DD, mild MR.   UPPER GI ENDOSCOPY     Family History  Problem Relation Age of Onset   Hypertension Mother    Cancer Mother        lung   Migraines Mother    Ulcers Mother    Hypertension Father    Migraines Father    Heart disease Father    Ulcers Father    Early death Sister    Diabetes Sister    Diabetes Paternal Grandmother    Kidney disease Paternal Grandfather        kidney failure   Heart attack Paternal Grandfather    Early death Sister    Obesity Sister    Hypertension Sister    Migraines Sister    Cancer Sister        ? cervical cancer   Multiple sclerosis Sister    Breast cancer Sister    Pseudochol deficiency Neg Hx    Malignant hyperthermia Neg Hx    Hypotension Neg Hx    Anesthesia problems Neg Hx    Social History   Socioeconomic History   Marital status: Married    Spouse name: Not on file   Number of children: Not on file   Years of education: Not on file   Highest education level: Master's degree (e.g., MA, MS, MEng, MEd, MSW, MBA)  Occupational History   Not on file  Tobacco Use   Smoking  status: Never    Passive exposure: Past   Smokeless tobacco: Never  Vaping Use   Vaping status: Never Used  Substance and Sexual Activity   Alcohol use: Not Currently    Comment: very rarely   Drug use: No   Sexual activity: Yes    Birth control/protection: Post-menopausal  Other Topics Concern   Not on file  Social History Narrative   Married, 3 step children.  She raised her nephew who has special needs.  Educ: masters deg   Occup: retired Psychologist, prison and probation services   Rare alcohol.   No tob.   Social Drivers of Corporate investment banker Strain: Low Risk  (11/25/2023)   Overall Financial Resource Strain (CARDIA)    Difficulty of Paying Living Expenses: Not hard at all  Food Insecurity: No Food Insecurity (11/25/2023)   Hunger Vital Sign    Worried About Running Out of Food in the Last Year: Never true    Ran Out of Food in the Last Year: Never true  Transportation Needs: No Transportation Needs (11/25/2023)   PRAPARE - Administrator, Civil Service (Medical): No    Lack of Transportation (Non-Medical): No  Physical Activity: Insufficiently Active (10/28/2023)   Exercise Vital Sign    Days of Exercise per Week: 1 day    Minutes of Exercise per Session: 10 min  Stress: Stress Concern Present (11/25/2023)   [employer] of Occupational Health - Occupational Stress Questionnaire    Feeling of Stress : To some extent  Social Connections: Socially Integrated (11/25/2023)   Social Connection and Isolation Panel [NHANES]    Frequency of Communication with Friends and Family: Three times a week    Frequency of Social Gatherings with Friends and Family: Twice a week    Attends Religious Services: More than 4 times per year    Active Member of [employer] or Organizations: Yes    Attends Engineer, structural: More than 4 times per year    Marital Status: Married    Tobacco Counseling Counseling given: Not Answered   Clinical Intake:  Pre-visit preparation  completed: Yes  Pain : No/denies pain     Diabetes: No  How often do you need to have someone help you when you read instructions, pamphlets, or other written materials from your doctor or pharmacy?: 1 - Never  Interpreter Needed?: No  Information entered by :: Theresa Haggard LPN   Activities of Daily Living    11/25/2023    4:02 PM  In your present state of health, do you have any difficulty performing the following activities:  Hearing? 0  Vision? 0  Difficulty concentrating or making decisions? 0  Walking or climbing stairs? 0  Dressing or bathing? 0  Doing errands, shopping? 0  Preparing Food and eating ? N  Using the Toilet? N  In the past six months, have you accidently leaked urine? N  Do you have problems with loss of bowel control? N  Managing your Medications? N  Managing your Finances? N  Housekeeping or managing your Housekeeping? N    Patient Care Team: Jeoffrey Massed, MD as PCP - General (Family Medicine) Adline Potter, NP as Nurse Practitioner (Obstetrics and Gynecology) Santiago Glad, MD as Referring Physician (Specialist) Marcelino Duster, MD as Referring Physician (Dermatology) Charna Elizabeth, MD as Consulting Physician (Gastroenterology) Santiago Glad, MD as Consulting Physician (Specialist) Lisbeth Renshaw, MD as Consulting Physician (Neurosurgery) Elinor Parkinson, [place] as Consulting Physician (Podiatry) Huston Foley, MD as Attending Physician (Neurology)  Indicate any recent Medical Services you may have received from other than [facility] providers in the past year (date may be approximate).     Assessment:   This is a routine wellness examination for Theresa Young.  Hearing/Vision screen Hearing Screening - Comments:: No trouble hearing Vision Screening - Comments:: Up to date My [facility] doctor  [place]   Goals Addressed             This Visit's Progress  Weight (lb) < 200 lb (90.7 kg)       Increase activity        Depression  Screen    11/25/2023    4:00 PM 10/28/2023    9:46 AM 09/04/2023    8:38 AM 06/18/2023    1:12 PM 05/11/2023    9:42 AM 04/29/2023    8:32 AM 03/16/2023   10:24 AM  PHQ 2/9 Scores  PHQ - 2 Score 0 0 1 1 1  0 1  PHQ- 9 Score 1  5 8 7 6 4     Fall Risk    11/25/2023    3:53 PM 10/28/2023    9:46 AM 09/04/2023    8:39 AM 07/29/2023    9:03 PM 05/11/2023    9:48 AM  Fall Risk   Falls in the past year? 0 0 0 1 0  Number falls in past yr: 0 0 0 1 0  Injury with Fall? 0 0 0 0 0  Risk for fall due to :  No Fall Risks     Follow up Falls evaluation completed;Education provided;Falls prevention discussed Falls evaluation completed       MEDICARE RISK AT HOME: Medicare Risk at Home Any stairs in or around the home?: Yes If so, are there any without handrails?: No Home free of loose throw rugs in walkways, pet beds, electrical cords, etc?: Yes Adequate lighting in your home to reduce risk of falls?: Yes Life alert?: No Use of a cane, walker or w/c?: No Grab bars in the bathroom?: No Shower chair or bench in shower?: No Elevated toilet seat or a handicapped toilet?: No  TIMED UP AND GO:  Was the test performed? No    Cognitive Function:        11/25/2023    3:57 PM  6CIT Screen  What Year? 0 points  What month? 0 points  What time? 0 points  Count back from 20 0 points  Months in reverse 0 points  Repeat phrase 0 points  Total Score 0 points    Immunizations Immunization History  Administered Date(s) Administered   Influenza, Seasonal, Injecte, Preservative Fre 04/29/2023   Influenza,inj,Quad PF,6+ Mos 07/15/2017, 05/19/2019, 10/23/2021, 04/25/2022   Influenza-Unspecified 05/24/2020   Moderna Covid-19 Fall Seasonal Vaccine 47yrs & older 06/13/2023   Moderna Sars-Covid-2 Vaccination 10/28/2019, 11/30/2019, 08/09/2020   PNEUMOCOCCAL CONJUGATE-20 04/29/2023   Tdap 04/18/2021   Zoster Recombinant(Shingrix) 04/18/2021, 10/23/2021    TDAP status: Up to date  Flu Vaccine status:  Up to date  Pneumococcal vaccine status: Up to date  Covid-19 vaccine status: Information provided on how to obtain vaccines.   Qualifies for Shingles Vaccine? No   Zostavax completed Yes   Shingrix Completed?: Yes  Screening Tests Health Maintenance  Topic Date Due   DEXA SCAN  Never done   COVID-19 Vaccine (5 - 2024-25 season) 12/12/2023   INFLUENZA VACCINE  03/25/2024   MAMMOGRAM  05/27/2024   Medicare Annual Wellness (AWV)  11/24/2024   Cervical Cancer Screening (HPV/Pap Cotest)  09/03/2028   Colonoscopy  02/19/2030   DTaP/Tdap/Td (2 - Td or Tdap) 04/19/2031   Pneumonia Vaccine 27+ Years old  Completed   Zoster Vaccines- Shingrix  Completed   HPV VACCINES  Aged Out   Hepatitis C Screening  Discontinued   HIV Screening  Discontinued    Health Maintenance  Health Maintenance Due  Topic Date Due   DEXA SCAN  Never done    Colorectal cancer screening: Type of screening:  Colonoscopy. Completed 2021. Repeat every 5-10 years  Mammogram status: Completed  . Repeat every year  Bone Density status: Ordered  . Pt provided with contact info and advised to call to schedule appt.  Lung Cancer Screening: (Low Dose CT Chest recommended if Age 3-80 years, 20 pack-year currently smoking OR have quit w/in 15years.) does not qualify.   Lung Cancer Screening Referral:   Additional Screening:  Hepatitis C Screening  never done  Vision Screening: Recommended annual ophthalmology exams for early detection of glaucoma and other disorders of the eye. Is the patient up to date with their annual eye exam?  Yes  Who is the provider or what is the name of the office in which the patient attends annual eye exams? [facility] If pt is not established with a provider, would they like to be referred to a provider to establish care? No .   Dental Screening: Recommended annual dental exams for proper oral hygiene    Community Resource Referral / Chronic Care Management: CRR required  this visit?  No   CCM required this visit?  No     Plan:     I have personally reviewed and noted the following in the patient's chart:   Medical and social history Use of alcohol, tobacco or illicit drugs  Current medications and supplements including opioid prescriptions. Patient is not currently taking opioid prescriptions. Functional ability and status Nutritional status Physical activity Advanced directives List of other physicians Hospitalizations, surgeries, and ER visits in previous 12 months Vitals Screenings to include cognitive, depression, and falls Referrals and appointments  In addition, I have reviewed and discussed with patient certain preventive protocols, quality metrics, and best practice recommendations. A written personalized care plan for preventive services as well as general preventive health recommendations were provided to patient.     Theresa Haggard, LPN   12/30/8467   After Visit Summary: (MyChart) Due to this being a telephonic visit, the after visit summary with patients personalized plan was offered to patient via MyChart   Nurse Notes:

## 2024-01-28 ENCOUNTER — Ambulatory Visit: Admitting: Family Medicine

## 2024-01-28 NOTE — Progress Notes (Deleted)
 Office Visit Note  Patient: Theresa Young             Date of Birth: [birth year]           MRN: [id number]             PCP: Shelvia Dick, MD Referring: Shelvia Dick, MD Visit Date: 02/11/2024   Subjective:  No chief complaint on file.   History of Present Illness: Theresa Young is a 68 y.o. female here for follow up with ongoing pain and numbness symptoms affecting her hands, worse on the right side.    Previous HPI 11/10/2023 Theresa Young is a 68 y.o. female here for follow up with ongoing pain and numbness symptoms affecting her hands, worse on the right side. Testing at our initial visit demonstrated persistent low positive RNP but otherwise reassuring. She has seen only limited response with conservative treatment splinting and stretching.   Previous HPI 10/01/23 Theresa Young is a 68 y.o. female who presents for evaluation of abnormal lab results with positive ANA and RNP antibodies checked associated with joint pain and fatigue.   She experiences joint pain, particularly in her hands, with accompanying numbness and stinging sensations. These symptoms sometimes wake her at night, affecting her hands up to her elbows. She occasionally takes Tylenol  for relief but not daily.    She has a history of abnormal lab results, including a positive ANA and RNP antibody at a low level. These findings have prompted further investigation to determine if there is an inflammatory process contributing to her symptoms.   She has a history of osteoarthritis, which has been previously diagnosed. Her weight has increased since her early thirties, but it has not previously limited her activities. However, in the last year and a half, she has experienced increased fatigue and joint pain, affecting her ability to perform daily tasks. She has been participating in a weight loss program healthy weight and wellness and was seeing some progress down to about 245 pounds in late 2023 but  subsequently saw progressive worsening despite which she felt was a similar level of diet and exercise.   She has a history of sleep disturbances, including insomnia and fragmented sleep, despite using CPAP for obstructive sleep apnea. She usually falls asleep but will wake up again during the middle of the night.  She estimates that she sleeps between six and seven hours per night but feels exhausted after activities such as participating in a farmer's market.   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   She has experienced gastrointestinal issues, including a small umbilical hernia and difficulty swallowing, which led to an upper GI test. She was prescribed pantoprazole  for acid reflux, which was a change from her previous medication.   She has a history of migraines, which have significantly improved after treatment with imipramine in the past. She no longer experiences frequent migraines and does not regularly fill her prescriptions for migraine medication.   She reports a history of thyroid  issues, having undergone a thyroidectomy after recurrent swelling and biopsies. Her thyroid  levels have been stable on medication since the surgery.   She has experienced anxiety, particularly at night, over the past year, which she attributes to her health issues and changes in her life post-retirement. She has a history of situational depression related to family losses but does not currently feel depressed.  No Rheumatology ROS completed.   PMFS History:  Patient Active Problem List   Diagnosis Date Noted   Positive ANA (antinuclear antibody) 10/01/2023   Carpal tunnel syndrome, right upper limb 10/01/2023   Pain in both hands 05/11/2023   Stress at home 02/02/2023   BMI 39.0-39.9,adult 10/06/2022   Anxiety 09/02/2022   Umbilical hernia without obstruction and without gangrene  09/02/2022   Dysphagia 06/26/2022   Hypercholesterolemia 06/11/2022   Prediabetes 03/24/2022   Postoperative hypothyroidism 03/20/2022   Other fatigue 03/20/2022   SOB (shortness of breath) on exertion 03/20/2022   Impaired fasting glucose 03/20/2022   Vitamin D  deficiency 03/20/2022   OSA on CPAP 03/20/2022   Health care maintenance 03/20/2022   Hypertriglyceridemia 03/20/2022   Essential hypertension 03/20/2022   Morbid obesity (HCC) 03/20/2022   Vitiligo 10/23/2021   Gastroesophageal reflux disease 10/05/2020   Encounter for screening fecal occult blood testing 07/25/2020   Encounter for gynecological examination with Papanicolaou smear of cervix 07/25/2020   Encounter for well woman exam with routine gynecological exam 02/18/2018   Screening for colorectal cancer 02/18/2018   Encounter for routine gynecological examination with Papanicolaou smear of cervix 02/16/2017   Breast abscess 07/28/2016   Hypothyroid 12/21/2014   Sleep apnea 12/21/2014   Snores 12/05/2013   Migraine headache 06/24/2007   Essential hypertension 06/24/2007   Closed fracture of metatarsal bone 06/24/2007    Past Medical History:  Diagnosis Date   Abdominal pain 2021   CT abd neg. suspect abd wall pain vs functional GI pain   Carpal tunnel syndrome    Cervical spondylosis    Depression    GERD (gastroesophageal reflux disease)    with mild esoph dysmotility and small sliding HH on barium esophagram 06/2022   History of MRSA infection    R hand soft tissue   Hyperlipidemia    Hypertension    Hypothyroidism    IFG (impaired fasting glucose)    A1c's 5.5-5.6% 2019-2022   Joint pain    Migraine syndrome    occasional   Obesity, Class II, BMI 35-39.9    OSA on CPAP 11/2013   severe; needs compliance monitoring->referred to neurologist ([facility])   Osteoarthritis    Knees, ankles, fingers, wrists, sometimes hips.  Occ prn NSAID   Postsurgical hypothyroidism 12/21/2014   Pre-diabetes    Vitamin D   deficiency 04/2019   22 ng/ml Sept 29, 2020.    Family History  Problem Relation Age of Onset   Hypertension Mother    Cancer Mother        lung   Migraines Mother    Ulcers Mother    Hypertension Father    Migraines Father    Heart disease Father    Ulcers Father    Early death Sister    Diabetes Sister    Diabetes Paternal Grandmother    Kidney disease Paternal Grandfather        kidney failure   Heart attack Paternal Grandfather    Early death Sister    Obesity Sister    Hypertension Sister    Migraines Sister    Cancer Sister        ? cervical cancer   Multiple sclerosis Sister    Breast cancer Sister    Pseudochol deficiency Neg Hx    Malignant hyperthermia Neg Hx    Hypotension Neg Hx    Anesthesia problems Neg Hx    Past Surgical History:  Procedure Laterality Date   APPENDECTOMY  6 yrs ago  Dr. Larrie Po   COLONOSCOPY  2012; 02/20/20   approx 2012, normal. 01/2020 polyp x 1->per Dr. Tova Fresh recall 5-10 yrs   CYSTOSCOPY     for pain in side; normal   ESOPHAGOGASTRODUODENOSCOPY     This was done for dysphagia/regurgitation.  Esophageal stricture but no dilatation required.-->  Symptoms improved with daily PPI.   FOOT SURGERY Right 2017   dorsal tarsal exostectomy, endoscopic plantar fasciotomy, bunionectomy with screw and removal of toenail 2nd toe R foot.   GANGLION CYST EXCISION     polysomnogram  11/2013   severe OSA   THYROIDECTOMY  12/10/2011   Bx benign.  Enlarging goiter.  Procedure: THYROIDECTOMY;  Surgeon: Beau Bound, MD;  Location: [facility];  Service: General;  Laterality: N/A;  Total Thyroidectomy   TRANSTHORACIC ECHOCARDIOGRAM  05/17/2015   EF 65-70%, mod LVH, normal wall motion, grd I DD, mild MR.   UPPER GI ENDOSCOPY     Social History   Social History Narrative   Married, 3 step children.  She raised her nephew who has special needs.   Educ: masters deg   Occup: retired Psychologist, prison and probation services   Rare alcohol.   No tob.   Immunization  History  Administered Date(s) Administered   Influenza, Seasonal, Injecte, Preservative Fre 04/29/2023   Influenza,inj,Quad PF,6+ Mos 07/15/2017, 05/19/2019, 10/23/2021, 04/25/2022   Influenza-Unspecified 05/24/2020   Moderna Covid-19 Fall Seasonal Vaccine 56yrs & older 06/13/2023   Moderna Sars-Covid-2 Vaccination 10/28/2019, 11/30/2019, 08/09/2020   PNEUMOCOCCAL CONJUGATE-20 04/29/2023   Tdap 04/18/2021   Zoster Recombinant(Shingrix) 04/18/2021, 10/23/2021     Objective: Vital Signs: There were no vitals taken for this visit.   Physical Exam   Musculoskeletal Exam: ***  CDAI Exam: CDAI Score: -- Patient Global: --; Provider Global: -- Swollen: --; Tender: -- Joint Exam 02/11/2024   No joint exam has been documented for this visit   There is currently no information documented on the homunculus. Go to the Rheumatology activity and complete the homunculus joint exam.  Investigation: No additional findings.  Imaging: No results found.  Recent Labs: Lab Results  Component Value Date   WBC 7.4 10/28/2023   HGB 14.4 10/28/2023   PLT 213 10/28/2023   NA 141 10/28/2023   K 3.9 10/28/2023   CL 104 10/28/2023   CO2 27 10/28/2023   GLUCOSE 111 (H) 10/28/2023   BUN 13 10/28/2023   CREATININE 0.93 10/28/2023   BILITOT 0.8 10/28/2023   ALKPHOS 57 10/24/2022   AST 22 10/28/2023   ALT 25 10/28/2023   PROT 6.9 10/28/2023   ALBUMIN 4.4 10/24/2022   CALCIUM  9.9 10/28/2023   GFRAA >60 11/18/2018    Speciality Comments: No specialty comments available.  Procedures:  No procedures performed Allergies: Bactrim  [sulfamethoxazole -trimethoprim ], Erythromycin, and Iodinated contrast media   Assessment / Plan:     Visit Diagnoses: No diagnosis found.  ***  Orders: No orders of the defined types were placed in this encounter.  No orders of the defined types were placed in this encounter.    Follow-Up Instructions: No follow-ups on file.   Glena Landau,  RT  Note - This record has been created using [employer].  Chart creation errors have been sought, but may not always  have been located. Such creation errors do not reflect on  the standard of medical care.

## 2024-02-01 ENCOUNTER — Ambulatory Visit (INDEPENDENT_AMBULATORY_CARE_PROVIDER_SITE_OTHER): Admitting: Family Medicine

## 2024-02-01 ENCOUNTER — Encounter: Payer: Self-pay | Admitting: Family Medicine

## 2024-02-01 VITALS — BP 121/66 | HR 68 | Temp 98.1°F | Wt 271.2 lb

## 2024-02-01 DIAGNOSIS — R7303 Prediabetes: Secondary | ICD-10-CM

## 2024-02-01 DIAGNOSIS — I10 Essential (primary) hypertension: Secondary | ICD-10-CM | POA: Diagnosis not present

## 2024-02-01 LAB — POCT GLYCOSYLATED HEMOGLOBIN (HGB A1C)
HbA1c POC (<> result, manual entry): 5.3 % (ref 4.0–5.6)
HbA1c, POC (controlled diabetic range): 5.3 % (ref 0.0–7.0)
HbA1c, POC (prediabetic range): 5.3 % — AB (ref 5.7–6.4)
Hemoglobin A1C: 5.3 % (ref 4.0–5.6)

## 2024-02-01 MED ORDER — LEVOTHYROXINE SODIUM 112 MCG PO TABS: ORAL_TABLET | ORAL | Status: DC

## 2024-02-01 MED ORDER — LEVOTHYROXINE SODIUM 112 MCG PO TABS: ORAL_TABLET | ORAL | 3 refills | Status: DC

## 2024-02-01 MED ORDER — PANTOPRAZOLE SODIUM 40 MG PO TBEC: 40.0000 mg | DELAYED_RELEASE_TABLET | Freq: Every day | ORAL | 3 refills | Status: DC

## 2024-02-01 MED ORDER — LOSARTAN POTASSIUM-HCTZ 100-12.5 MG PO TABS: ORAL_TABLET | ORAL | 3 refills | Status: DC

## 2024-02-01 MED ORDER — LOSARTAN POTASSIUM-HCTZ 100-12.5 MG PO TABS: ORAL_TABLET | ORAL | Status: DC

## 2024-02-01 NOTE — Progress Notes (Signed)
 OFFICE VISIT  02/01/2024  CC:  Chief Complaint  Patient presents with   Medical Management of Chronic Issues   Patient is a 68 y.o. female who presents for 3 mo f/u prediabetes and hypertension. A/P as of last visit: "1 prediabetes, doing well on metformin  500 mg twice a day. A1c and fasting glucose ordered today.   #2 hypertension, stable on losartan -HCTZ 100-12.51 tablet daily. Check electrolytes and creatinine today.   3.  Diffuse arthralgias. Low suspicion autoimmune disease per rheumatologist.   #4 generalized anxiety disorder. Doing well on BuSpar  5 mg twice daily.   #5 carpal tunnel syndrome bilaterally. Rheumatologist has referred her for nerve conduction studies and we will then see her for follow-up. Of note, bedside MSK ultrasound today showed right median nerve area 1.5 cm, left 1.1 cm."  INTERIM HX: She is feeling very well.   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  She is doing great with diet and is getting more more active.  She is looking forward to setting up some water PT/exercise.  No acute concerns.  Past Medical History:  Diagnosis Date   Abdominal pain 2021   CT abd neg. suspect abd wall pain vs functional GI pain   Carpal tunnel syndrome    Cervical spondylosis    Depression    GERD (gastroesophageal reflux disease)    with mild esoph dysmotility and small sliding HH on barium esophagram 06/2022   History of MRSA infection    R hand soft tissue   Hyperlipidemia    Hypertension    Hypothyroidism    IFG (impaired fasting glucose)    A1c's 5.5-5.6% 2019-2022   Joint pain    Migraine syndrome    occasional   Obesity, Class II, BMI 35-39.9    OSA on CPAP 11/2013   severe; needs compliance monitoring->referred to neurologist ([facility])   Osteoarthritis    Knees, ankles, fingers, wrists, sometimes hips.  Occ prn NSAID   Postsurgical hypothyroidism 12/21/2014   Pre-diabetes    Vitamin D  deficiency 04/2019   22  ng/ml Sept 29, 2020.    Past Surgical History:  Procedure Laterality Date   APPENDECTOMY  6 yrs ago   Dr. Larrie Po   COLONOSCOPY  2012; 02/20/20   approx 2012, normal. 01/2020 polyp x 1->per Dr. Tova Fresh recall 5-10 yrs   CYSTOSCOPY     for pain in side; normal   ESOPHAGOGASTRODUODENOSCOPY     This was done for dysphagia/regurgitation.  Esophageal stricture but no dilatation required.-->  Symptoms improved with daily PPI.   FOOT SURGERY Right 2017   dorsal tarsal exostectomy, endoscopic plantar fasciotomy, bunionectomy with screw and removal of toenail 2nd toe R foot.   GANGLION CYST EXCISION     polysomnogram  11/2013   severe OSA   THYROIDECTOMY  12/10/2011   Bx benign.  Enlarging goiter.  Procedure: THYROIDECTOMY;  Surgeon: Beau Bound, MD;  Location: [facility];  Service: General;  Laterality: N/A;  Total Thyroidectomy   TRANSTHORACIC ECHOCARDIOGRAM  05/17/2015   EF 65-70%, mod LVH, normal wall motion, grd I DD, mild MR.   UPPER GI ENDOSCOPY      Outpatient Medications Prior to Visit  Medication Sig Dispense Refill   acetaminophen  (TYLENOL  8 HOUR) 650 MG CR tablet Take 650 mg by mouth every 8 (eight) hours as needed for pain.     busPIRone  (BUSPAR ) 5 MG tablet Take 1 tablet (5 mg total) by mouth 2 (two) times daily.  60 tablet 6   eletriptan (RELPAX) 40 MG tablet Take 40 mg by mouth as needed. may repeat in 2 hours if necessary for migraines     metFORMIN  (GLUCOPHAGE ) 500 MG tablet Take 1 tablet (500 mg total) by mouth 2 (two) times daily with a meal. 180 tablet 1   rosuvastatin  (CRESTOR ) 10 MG tablet Take 1 tablet (10 mg total) by mouth daily. 90 tablet 1   VITAMIN D  PO Take by mouth daily.     pantoprazole  (PROTONIX ) 40 MG tablet Take 40 mg by mouth daily.     levothyroxine  (SYNTHROID ) 112 MCG tablet TAKE (1) TABLET BY MOUTH EACH MORNING ON AN EMPTY STOMACH. 90 tablet 0   losartan -hydrochlorothiazide  (HYZAAR) 100-12.5 MG tablet TAKE (1) TABLET BY MOUTH ONCE DAILY. 90 tablet 3   No  facility-administered medications prior to visit.    Allergies  Allergen Reactions   Bactrim  [Sulfamethoxazole -Trimethoprim ] Nausea And Vomiting   Erythromycin Hives    REACTION: rash on trunk of body   Iodinated Contrast Media Swelling and Other (See Comments)    Reaction: itching,swelling around the eyes    Review of Systems As per HPI  PE:    02/01/2024    1:04 PM 11/10/2023   11:02 AM 10/28/2023    9:46 AM  Vitals with BMI  Height  5\' 7"  5' 7.5"  Weight 271 lbs 3 oz 274 lbs 272 lbs 3 oz  BMI  42.9 41.98  Systolic 121 131 782  Diastolic 66 81 84  Pulse 68 64 73     Physical Exam  Gen: Alert, well appearing.  Patient is oriented to person, place, time, and situation. AFFECT: pleasant, lucid thought and speech. No further exam today  LABS:  Last CBC Lab Results  Component Value Date   WBC 7.4 10/28/2023   HGB 14.4 10/28/2023   HCT 43.7 10/28/2023   MCV 89.5 10/28/2023   MCH 29.5 10/28/2023   RDW 12.8 10/28/2023   PLT 213 10/28/2023   Last metabolic panel Lab Results  Component Value Date   GLUCOSE 111 (H) 10/28/2023   NA 141 10/28/2023   K 3.9 10/28/2023   CL 104 10/28/2023   CO2 27 10/28/2023   BUN 13 10/28/2023   CREATININE 0.93 10/28/2023   GFR 63.97 10/24/2022   CALCIUM  9.9 10/28/2023   PHOS 3.9 12/11/2011   PROT 6.9 10/28/2023   ALBUMIN 4.4 10/24/2022   LABGLOB 2.0 03/20/2022   AGRATIO 2.3 (H) 03/20/2022   BILITOT 0.8 10/28/2023   ALKPHOS 57 10/24/2022   AST 22 10/28/2023   ALT 25 10/28/2023   ANIONGAP 10 11/18/2018   Last lipids Lab Results  Component Value Date   CHOL 163 04/29/2023   HDL 67 04/29/2023   LDLCALC 70 04/29/2023   LDLDIRECT 68.0 10/23/2021   TRIG 182 (H) 04/29/2023   CHOLHDL 2.4 04/29/2023   Last hemoglobin A1c Lab Results  Component Value Date   HGBA1C 5.3 02/01/2024   HGBA1C 5.3 02/01/2024   HGBA1C 5.3 (A) 02/01/2024   HGBA1C 5.3 02/01/2024   Last thyroid  functions Lab Results  Component Value Date   TSH  3.78 10/28/2023   Last vitamin D  Lab Results  Component Value Date   VD25OH 46 04/29/2023   IMPRESSION AND PLAN:  1 prediabetes, doing well on metformin  500 mg twice a day. Point-of-care hemoglobin A1c today is 5.3%. Keep up all the great work.   #2 hypertension, stable on losartan -HCTZ 100-12.51 tablet daily. Electrolytes and creatinine normal about 3  months ago.   An After Visit Summary was printed and given to the patient.  FOLLOW UP: Return in about 6 months (around 08/02/2024) for routine chronic illness f/u. Next CPE March 2026 Signed:  Arletha Lady, MD           02/01/2024

## 2024-02-01 NOTE — Addendum Note (Signed)
 Addended by: Roddie Cisco on: 02/01/2024 03:02 PM   Modules accepted: Orders

## 2024-02-11 ENCOUNTER — Ambulatory Visit: Admitting: Internal Medicine

## 2024-02-11 DIAGNOSIS — M25531 Pain in right wrist: Secondary | ICD-10-CM

## 2024-02-11 DIAGNOSIS — G5601 Carpal tunnel syndrome, right upper limb: Secondary | ICD-10-CM

## 2024-02-23 ENCOUNTER — Other Ambulatory Visit: Payer: Self-pay

## 2024-02-23 DIAGNOSIS — E890 Postprocedural hypothyroidism: Secondary | ICD-10-CM

## 2024-02-23 MED ORDER — LEVOTHYROXINE SODIUM 112 MCG PO TABS: ORAL_TABLET | ORAL | 1 refills | Status: DC

## 2024-03-07 ENCOUNTER — Other Ambulatory Visit: Payer: Self-pay | Admitting: Adult Health

## 2024-03-07 MED ORDER — BUSPIRONE HCL 5 MG PO TABS: 5.0000 mg | ORAL_TABLET | Freq: Two times a day (BID) | ORAL | 2 refills | Status: DC

## 2024-03-07 NOTE — Progress Notes (Unsigned)
Refilled buspar 

## 2024-03-24 ENCOUNTER — Other Ambulatory Visit: Payer: Self-pay | Admitting: Family Medicine

## 2024-04-19 DIAGNOSIS — H52223 Regular astigmatism, bilateral: Secondary | ICD-10-CM | POA: Diagnosis not present

## 2024-04-19 DIAGNOSIS — H5203 Hypermetropia, bilateral: Secondary | ICD-10-CM | POA: Diagnosis not present

## 2024-04-19 DIAGNOSIS — H524 Presbyopia: Secondary | ICD-10-CM | POA: Diagnosis not present

## 2024-05-09 DIAGNOSIS — H02831 Dermatochalasis of right upper eyelid: Secondary | ICD-10-CM | POA: Diagnosis not present

## 2024-05-09 DIAGNOSIS — L821 Other seborrheic keratosis: Secondary | ICD-10-CM | POA: Diagnosis not present

## 2024-05-09 DIAGNOSIS — H25811 Combined forms of age-related cataract, right eye: Secondary | ICD-10-CM | POA: Diagnosis not present

## 2024-05-09 DIAGNOSIS — H2512 Age-related nuclear cataract, left eye: Secondary | ICD-10-CM | POA: Diagnosis not present

## 2024-05-09 DIAGNOSIS — H02834 Dermatochalasis of left upper eyelid: Secondary | ICD-10-CM | POA: Diagnosis not present

## 2024-05-09 DIAGNOSIS — H01001 Unspecified blepharitis right upper eyelid: Secondary | ICD-10-CM | POA: Diagnosis not present

## 2024-05-09 DIAGNOSIS — L570 Actinic keratosis: Secondary | ICD-10-CM | POA: Diagnosis not present

## 2024-05-09 DIAGNOSIS — L573 Poikiloderma of Civatte: Secondary | ICD-10-CM | POA: Diagnosis not present

## 2024-05-09 DIAGNOSIS — L80 Vitiligo: Secondary | ICD-10-CM | POA: Diagnosis not present

## 2024-05-14 DIAGNOSIS — G4733 Obstructive sleep apnea (adult) (pediatric): Secondary | ICD-10-CM | POA: Diagnosis not present

## 2024-05-24 NOTE — Progress Notes (Signed)
 Theresa Young                                          MRN: [id number]   05/24/2024   The [facility] Quality Team Specialist reviewed this patient medical record for the purposes of chart review for care gap closure. The following were reviewed: chart review for care gap closure-kidney health evaluation for diabetes:eGFR  and uACR.    [facility] Quality Team

## 2024-05-30 DIAGNOSIS — H25811 Combined forms of age-related cataract, right eye: Secondary | ICD-10-CM | POA: Diagnosis not present

## 2024-06-01 NOTE — H&P (Signed)
 Surgical History & Physical  Patient Name: Theresa Young  DOB: Sep 06, 1955  Surgery: Cataract extraction with intraocular lens implant phacoemulsification; Right Eye Surgeon: Lynwood Hermann MD Surgery Date: 06/06/2024 Pre-Op Date: 05/09/2024  HPI: A 58 Yr. old female patient 1.  The patient is here today for a Cataract Evaluation. The patient complains of difficulty when reading fine print, books, newspaper, instructions etc., which began 9 months ago. Both eyes are affected. The episode is constant. The patient describes foggy and hazy symptoms affecting their eyes/vision. This is negatively affecting the patient's quality of life and the patient is unable to function adequately in life with the current level of vision. HPI Completed by Dr. Lynwood Hermann  Medical History: Cataracts  Arthritis High Blood Pressure LDL Thyroid  Problems  Review of Systems Cardiovascular High Blood Pressure Endocrine ldl Hematologic/Lymphatic diabetes Musculoskeletal arthritis All recorded systems are negative except as noted above.  Social Never Smoked  Medication Prednisolone-moxiflox-bromfen,  Levothyroxine , Metformin , Buspirone , Pantoprazole , Rosuvastatin , Eletriptan, Losartan -hydrochlorothiazide   Sx/Procedures None  Drug Allergies  erythromycin , Sulfa   History & Physical: Heent: cataracts NECK: supple without bruits LUNGS: lungs clear to auscultation CV: regular rate and rhythm Abdomen: soft and non-tender  Impression & Plan: Assessment: 1.  COMBINED FORMS AGE RELATED CATARACT; Right Eye (H25.811) 2.  DERMATOCHALASIS, no surgery; Right Upper Lid, Left Upper Lid (H02.831, H02.834) 3.  BLEPHARITIS; Right Upper Lid, Right Lower Lid, Left Upper Lid, Left Lower Lid (H01.001, H01.002,H01.004,H01.005) 4.  NUCLEAR SCLEROSIS AGE RELATED; Left Eye (H25.12) 5.  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  Cataract accounts for the patient's decreased vision. This visual impairment is  not correctable with a tolerable change in glasses or contact lenses. Cataract surgery with an implantation of a new lens should significantly improve the visual and functional status of the patient. Recommend phacoemulsification with intraocular lens. Discussed all risks, benefits, alternatives, and potential complications. Discussed the procedures and recovery. The patient desires to have surgery. A-scan/Biometry ordered and will be performed for intraocular lens calculations. The surgery will be performed in order to improve vision for driving, reading, and for eye examinations. Recommend Dextenza  for post-operative pain and inflammation. Educational materials provided: Cataract. History of corneal refractive Surgery: None History of Previous Ocular Surgery (PPV, other): None History of ocular trauma: None Use of Eye Pressure Lowering Drops: None No current contact lens use. Pupil Status: Dilates well - shugarcaine or Lidocaine +Omidira by protocol Right Eye first. Tentatively schedule OS. Refractive Goal: [place] Recommend Toric Lens OU. Discussed Panoptix Pro Toric.  2.  Asymptomatic, recommend observation for now. Findings, prognosis and treatment options reviewed.  3.  Blepharitis is present - recommend regular lid cleaning.  4.  Will address after right eye.  5.  Recommend Toric IOL OU

## 2024-06-02 ENCOUNTER — Other Ambulatory Visit: Payer: Self-pay

## 2024-06-02 ENCOUNTER — Encounter (HOSPITAL_COMMUNITY): Payer: Self-pay

## 2024-06-02 ENCOUNTER — Encounter (HOSPITAL_COMMUNITY)
Admission: RE | Admit: 2024-06-02 | Discharge: 2024-06-02 | Disposition: A | Discharge status: Home / Self Care | Source: Ambulatory Visit | Attending: Ophthalmology | Admitting: Ophthalmology

## 2024-06-02 HISTORY — DX: Family history of other specified conditions: Z84.89

## 2024-06-06 ENCOUNTER — Ambulatory Visit (HOSPITAL_COMMUNITY): Admitting: Anesthesiology

## 2024-06-06 ENCOUNTER — Encounter (HOSPITAL_COMMUNITY): Payer: Self-pay | Admitting: Ophthalmology

## 2024-06-06 ENCOUNTER — Encounter (HOSPITAL_COMMUNITY)
Admission: RE | Disposition: A | Discharge status: Home / Self Care | Payer: Self-pay | Source: Home / Self Care | Attending: Ophthalmology

## 2024-06-06 ENCOUNTER — Ambulatory Visit (HOSPITAL_COMMUNITY)
Admission: RE | Admit: 2024-06-06 | Discharge: 2024-06-06 | Disposition: A | Discharge status: Home / Self Care | Attending: Ophthalmology | Admitting: Ophthalmology

## 2024-06-06 DIAGNOSIS — K219 Gastro-esophageal reflux disease without esophagitis: Secondary | ICD-10-CM | POA: Diagnosis not present

## 2024-06-06 DIAGNOSIS — G4730 Sleep apnea, unspecified: Secondary | ICD-10-CM | POA: Diagnosis not present

## 2024-06-06 DIAGNOSIS — E039 Hypothyroidism, unspecified: Secondary | ICD-10-CM | POA: Insufficient documentation

## 2024-06-06 DIAGNOSIS — F419 Anxiety disorder, unspecified: Secondary | ICD-10-CM | POA: Insufficient documentation

## 2024-06-06 DIAGNOSIS — F418 Other specified anxiety disorders: Secondary | ICD-10-CM | POA: Diagnosis not present

## 2024-06-06 DIAGNOSIS — F32A Depression, unspecified: Secondary | ICD-10-CM | POA: Diagnosis not present

## 2024-06-06 DIAGNOSIS — Z79890 Hormone replacement therapy (postmenopausal): Secondary | ICD-10-CM | POA: Diagnosis not present

## 2024-06-06 DIAGNOSIS — I10 Essential (primary) hypertension: Secondary | ICD-10-CM | POA: Diagnosis not present

## 2024-06-06 DIAGNOSIS — H25811 Combined forms of age-related cataract, right eye: Secondary | ICD-10-CM

## 2024-06-06 HISTORY — PX: CATARACT EXTRACTION W/PHACO: SHX586

## 2024-06-06 LAB — GLUCOSE, CAPILLARY: Glucose-Capillary: 117 mg/dL — ABNORMAL HIGH (ref 70–99)

## 2024-06-06 SURGERY — PHACOEMULSIFICATION, CATARACT, WITH IOL INSERTION
Anesthesia: Monitor Anesthesia Care | Site: Eye | Laterality: Right

## 2024-06-06 MED ORDER — STERILE WATER FOR IRRIGATION IR SOLN
Status: DC | PRN
  Administered 2024-06-06: 1

## 2024-06-06 MED ORDER — PHENYLEPHRINE HCL 2.5 % OP SOLN
1.0000 [drp] | OPHTHALMIC | Status: AC | PRN
  Administered 2024-06-06 (×3): 1 [drp] via OPHTHALMIC

## 2024-06-06 MED ORDER — LIDOCAINE HCL 3.5 % OP GEL
1.0000 | Freq: Once | OPHTHALMIC | Status: AC
  Administered 2024-06-06: 1 via OPHTHALMIC

## 2024-06-06 MED ORDER — SODIUM HYALURONATE 10 MG/ML IO SOLUTION
PREFILLED_SYRINGE | INTRAOCULAR | Status: DC | PRN
  Administered 2024-06-06: .85 mL via INTRAOCULAR

## 2024-06-06 MED ORDER — PHENYLEPHRINE-KETOROLAC 1-0.3 % IO SOLN
INTRAOCULAR | Status: DC | PRN
  Administered 2024-06-06: 500 mL via OPHTHALMIC

## 2024-06-06 MED ORDER — TETRACAINE HCL 0.5 % OP SOLN
1.0000 [drp] | OPHTHALMIC | Status: AC | PRN
  Administered 2024-06-06 (×3): 1 [drp] via OPHTHALMIC

## 2024-06-06 MED ORDER — TROPICAMIDE 1 % OP SOLN
1.0000 [drp] | OPHTHALMIC | Status: AC | PRN
  Administered 2024-06-06 (×3): 1 [drp] via OPHTHALMIC

## 2024-06-06 MED ORDER — SODIUM HYALURONATE 23MG/ML IO SOSY
PREFILLED_SYRINGE | INTRAOCULAR | Status: DC | PRN
Start: 2024-06-06 — End: 2024-06-06
  Administered 2024-06-06: .6 mL via INTRAOCULAR

## 2024-06-06 MED ORDER — MOXIFLOXACIN HCL 5 MG/ML IO SOLN
INTRAOCULAR | Status: AC
  Filled 2024-06-06: qty 1

## 2024-06-06 MED ORDER — POVIDONE-IODINE 5 % OP SOLN
OPHTHALMIC | Status: DC | PRN
  Administered 2024-06-06: 1 via OPHTHALMIC

## 2024-06-06 MED ORDER — MIDAZOLAM HCL 5 MG/5ML IJ SOLN
INTRAMUSCULAR | Status: DC | PRN
Start: 2024-06-06 — End: 2024-06-06
  Administered 2024-06-06: 2 mg via INTRAVENOUS

## 2024-06-06 MED ORDER — MIDAZOLAM HCL 2 MG/2ML IJ SOLN
INTRAMUSCULAR | Status: AC
  Filled 2024-06-06: qty 2

## 2024-06-06 MED ORDER — MOXIFLOXACIN HCL 5 MG/ML IO SOLN
INTRAOCULAR | Status: DC | PRN
  Administered 2024-06-06: .3 mL via INTRACAMERAL

## 2024-06-06 MED ORDER — BSS IO SOLN
INTRAOCULAR | Status: DC | PRN
  Administered 2024-06-06: 15 mL via INTRAOCULAR

## 2024-06-06 MED ORDER — LIDOCAINE HCL (PF) 1 % IJ SOLN
INTRAMUSCULAR | Status: DC | PRN
  Administered 2024-06-06: 1 mL

## 2024-06-06 SURGICAL SUPPLY — 12 items
CLOTH BEACON ORANGE TIMEOUT ST (SAFETY) ×1 IMPLANT
EYE SHIELD UNIVERSAL CLEAR (GAUZE/BANDAGES/DRESSINGS) IMPLANT
FEE CATARACT SUITE SIGHTPATH (MISCELLANEOUS) ×1 IMPLANT
GLOVE BIOGEL PI IND STRL 7.0 (GLOVE) ×2 IMPLANT
LENS IOL EYHANCE TRC 150 22.0 IMPLANT
MARKER SKIN DUAL TIP RULER LAB (MISCELLANEOUS) IMPLANT
NDL HYPO 18GX1.5 BLUNT FILL (NEEDLE) ×1 IMPLANT
NEEDLE HYPO 18GX1.5 BLUNT FILL (NEEDLE) ×1 IMPLANT
PAD ARMBOARD POSITIONER FOAM (MISCELLANEOUS) ×1 IMPLANT
SYR TB 1ML LL NO SAFETY (SYRINGE) ×1 IMPLANT
TAPE SURG TRANSPORE 1 IN (GAUZE/BANDAGES/DRESSINGS) IMPLANT
WATER STERILE IRR 250ML POUR (IV SOLUTION) ×1 IMPLANT

## 2024-06-06 NOTE — Interval H&P Note (Signed)
 History and Physical Interval Note:  06/06/2024 7:43 AM  Theresa Young  has presented today for surgery, with the diagnosis of combined forms age related cataract, right eye.  The various methods of treatment have been discussed with the patient and family. After consideration of risks, benefits and other options for treatment, the patient has consented to  Procedure(s): PHACOEMULSIFICATION, CATARACT, WITH IOL INSERTION (Right) as a surgical intervention.  The patient's history has been reviewed, patient examined, no change in status, stable for surgery.  I have reviewed the patient's chart and labs.  Questions were answered to the patient's satisfaction.     HARRIE AGENT

## 2024-06-06 NOTE — Discharge Instructions (Addendum)
 Please discharge patient when stable, will follow up today with Dr. June Leap at the [facility] office immediately following discharge.  Leave shield in place until visit.  All paperwork with discharge instructions will be given at the office.  [facility] Address:  [address]  [place], [place] [address]

## 2024-06-06 NOTE — Transfer of Care (Signed)
 Immediate Anesthesia Transfer of Care Note  Patient: Theresa Young  Procedure(s) Performed: PHACOEMULSIFICATION, CATARACT, WITH IOL INSERTION (Right: Eye)  Patient Location: PACU  Anesthesia Type:MAC  Level of Consciousness: awake, alert , and oriented  Airway & Oxygen Therapy: Patient Spontanous Breathing  Post-op Assessment: Report given to RN and Post -op Vital signs reviewed and stable  Post vital signs: Reviewed and stable  Last Vitals:  Vitals Value Taken Time  BP    Temp    Pulse    Resp    SpO2      Last Pain:  Vitals:   06/06/24 0738  TempSrc: Oral  PainSc: 0-No pain      Patients Stated Pain Goal: 5 (06/06/24 0738)  Complications: No notable events documented.

## 2024-06-06 NOTE — Anesthesia Postprocedure Evaluation (Signed)
 Anesthesia Post Note  Patient: Theresa Young  Procedure(s) Performed: PHACOEMULSIFICATION, CATARACT, WITH IOL INSERTION (Right: Eye)  Patient location during evaluation: Phase II Anesthesia Type: MAC Level of consciousness: awake Pain management: pain level controlled Vital Signs Assessment: post-procedure vital signs reviewed and stable Respiratory status: spontaneous breathing and respiratory function stable Cardiovascular status: blood pressure returned to baseline and stable Postop Assessment: no headache and no apparent nausea or vomiting Anesthetic complications: no Comments: Late entry   No notable events documented.   Last Vitals:  Vitals:   06/06/24 0738 06/06/24 0824  BP: (!) 158/85 (!) 144/85  Pulse: 77 73  Resp: 18   Temp: 36.8 C 36.8 C  SpO2: 97% 96%    Last Pain:  Vitals:   06/06/24 0824  TempSrc: Oral  PainSc: 0-No pain                 [name]

## 2024-06-06 NOTE — Op Note (Signed)
 aDate of procedure: 06/06/24  Pre-operative diagnosis: Visually significant age-related combined cataract, Right Eye; Visually Significant Astigmatism, Right Eye (H25.811)  Post-operative diagnosis: Visually significant age-related cataract, Right Eye; Visually Significant Astigmatism, Right Eye  Procedure: Removal of cataract via phacoemulsification and insertion of intra-ocular lens Vicci and Johnson DIU150 +22.0D into the capsular bag of the Right Eye  Attending surgeon: Lynwood LABOR. Emmerie Battaglia, MD, MA  Anesthesia: MAC, Topical Akten  Complications: None  Estimated Blood Loss: <34mL (minimal)  Specimens: None  Implants: As above  Indications:  Visually significant age-related cataract, Right Eye; Visually Significant Astigmatism, Right Eye  Procedure:  The patient was seen and identified in the pre-operative area. The operative eye was identified and dilated.  The operative eye was marked.  Pre-operative toric markers were used to mark the eye at 0 and 180 degrees. Topical anesthesia was administered to the operative eye.     The patient was then to the operative suite and placed in the supine position.  A timeout was performed confirming the patient, procedure to be performed, and all other relevant information.   The patient's face was prepped and draped in the usual fashion for intra-ocular surgery.  A lid speculum was placed into the operative eye and the surgical microscope moved into place and focused.  A superotemporal paracentesis was created using a 20 gauge paracentesis blade. Omidria was injected into the anterior chamber.  Shugarcaine was injected into the anterior chamber.  Viscoelastic was injected into the anterior chamber.  A temporal clear-corneal main wound incision was created using a 2.71mm microkeratome.  A continuous curvilinear capsulorrhexis was initiated using an irrigating cystitome and completed using capsulorrhexis forceps.  Hydrodissection and hydrodeliniation were  performed.  Viscoelastic was injected into the anterior chamber.  A phacoemulsification handpiece and a chopper as a second instrument were used to remove the nucleus and epinucleus. The irrigation/aspiration handpiece was used to remove any remaining cortical material.   The capsular bag was reinflated with viscoelastic, checked, and found to be intact.  The intraocular lens was inserted into the capsular bag and dialed into place using a Kuglen hook to 103 degrees.  The irrigation/aspiration handpiece was used to remove any remaining viscoelastic.  The clear corneal wound and paracentesis wounds were then hydrated and checked with Weck-Cels to be watertight. 0.1mL of moxifloxacin was injected into the anterior chamber. The lid-speculum was removed.  The drape was removed. The patient's face was cleaned with a wet and dry 4x4. A clear shield was taped over the eye. The patient was taken to the post-operative care unit in good condition, having tolerated the procedure well.  Post-Op Instructions: The patient will follow up at [facility] for a same day post-operative evaluation and will receive all other orders and instructions.

## 2024-06-06 NOTE — Anesthesia Preprocedure Evaluation (Addendum)
 Anesthesia Evaluation  Patient identified by MRN, date of birth, ID band Patient awake    Reviewed: Allergy & Precautions, H&P , NPO status , Patient's Chart, lab work & pertinent test results, reviewed documented beta blocker date and time   History of Anesthesia Complications (+) Family history of anesthesia reaction  Airway Mallampati: II  TM Distance: >3 FB Neck ROM: full    Dental no notable dental hx.    Pulmonary sleep apnea    Pulmonary exam normal breath sounds clear to auscultation       Cardiovascular Exercise Tolerance: Good hypertension,  Rhythm:regular Rate:Normal     Neuro/Psych  Headaches PSYCHIATRIC DISORDERS Anxiety Depression     Neuromuscular disease    GI/Hepatic Neg liver ROS,GERD  ,,  Endo/Other  Hypothyroidism  Class 3 obesity  Renal/GU negative Renal ROS  negative genitourinary   Musculoskeletal   Abdominal   Peds  Hematology negative hematology ROS (+)   Anesthesia Other Findings   Reproductive/Obstetrics negative OB ROS                              Anesthesia Physical Anesthesia Plan  ASA: 3  Anesthesia Plan: MAC   Post-op Pain Management:    Induction:   PONV Risk Score and Plan:   Airway Management Planned:   Additional Equipment:   Intra-op Plan:   Post-operative Plan:   Informed Consent: I have reviewed the patients History and Physical, chart, labs and discussed the procedure including the risks, benefits and alternatives for the proposed anesthesia with the patient or authorized representative who has indicated his/her understanding and acceptance.     Dental Advisory Given  Plan Discussed with: CRNA  Anesthesia Plan Comments:          Anesthesia Quick Evaluation

## 2024-06-07 DIAGNOSIS — G43019 Migraine without aura, intractable, without status migrainosus: Secondary | ICD-10-CM | POA: Diagnosis not present

## 2024-06-09 NOTE — Patient Instructions (Incomplete)

## 2024-06-09 NOTE — Progress Notes (Deleted)
 PATIENT: Theresa Young DOB: 02-Mar-1956  REASON FOR VISIT: follow up HISTORY FROM: patient  No chief complaint on file.    HISTORY OF PRESENT ILLNESS:  06/09/24 ALL:  [name] returns for follow up for OSA on CPAP. She continues to do well on therapy.   06/22/2023 ALL:  [name] returns for follow up for OSA on CPAP. She is doing well. She is using CPAP nightly for about 7-8 hours, on average. She does not sleep without her machine. She denies concerns with machine or supplies. She has had some trouble with weight gaine, anxiety and insomnia, recently. PCP recent updated blood work. TSH slightly increased. She has follow up soon to discuss. She does report having a headache, today. BP is usually well managed at home.     06/12/2022 ALL: [name] returns for follow up for OSA on CPAP. She continues to well. She is using CPAP every night for about 7-8 hours. She reports really bad sore throat if she does not use CPAP. She denies concerns with supplies or machine.     06/11/2021 ALL: [name] is a 68 y.o. female here today for follow up for OSA on CPAP.  She was previously on CPAP and needed new machine. HST 09/17/2020 confirmed severe OSA with total AHI of 72.3/hr and O2 nadir of 82%. AutoPAP ordered. She is doing fairly well on her new machine. She does not like the heated humidity. She would prefer to have heat turned off. She is not sure about the different settings or how to adjust humidity setting on her new machine. She also reports hearing a clicking noise every night as her machine is ramping. She denies concerns with supplies. She is resting well. She can not sleep without her machine.   Compliance report dated 05/12/2021-06/10/2021 shows that she used CPAP 30/30 days for greater than 4 hours. Average usage was 7hr . Residual AHI was 2 on 7-13cmH20. Pressure in the 95th percentile of 12.8. NO significant leak noted.   HISTORY: (copied from Dr Obie previous note)  Dear  Dr. Candise,    I saw your patient, Theresa Young, upon your kind request in my sleep clinic today for initial consultation of her sleep disorder, in particular, evaluation of her prior diagnosis of obstructive sleep apnea.  The patient is unaccompanied today.  As you know, [name] is a 68 year old right-handed woman with an underlying medical history of hypertension, hyperlipidemia, hypothyroidism, vitamin D  deficiency, osteoarthritis, migraines (followed by Dr. Oneita), carpal tunnel syndrome, reflux disease, depression, cervical spondylosis, and severe obesity with a BMI of over 40, who was previously diagnosed with obstructive sleep apnea and placed on positive airway pressure treatment.  I reviewed your office note from 07/13/2020.  I was able to review her sleep study from 12/19/2013.  Study was interpreted by Dr. Milton time.  She probably had a split-night sleep study.  Baseline AHI was reportedly 51/h, O2 nadir 79% with a baseline oxygen saturation of 97%.  She was noted to have frequent PVCs.  She noted was noted to respond well to CPAP at 10 cm. Her Epworth sleepiness score is 12 out of 24, fatigue severity score 25 out of 63.  She lives with her husband and they are raising a great nephew, age 43.  She takes him to school.  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  Bedtime is generally between 930 and 10 and rise  time around 630, latest by 6:45 AM.  She denies recurrent morning headaches but does have nocturia about once per average night.  She suspects that her father had sleep apnea, he died in his 40s from heart disease.  She uses a Primary school teacher medium size full facemask, sometimes she notices the leak.  I was able to review her CPAP compliance data from her machine.  She has a S9 escape from [employer].  Pressure at 10 cm, leak and residual AHI information is unfortunately not available.  From 07/28/2020 through 08/26/2020 she has used her  machine every night with percent use days greater than 4 hours at 100%, indicating superb compliance, average usage of 7 hours and 52 minutes.  He does have some difficulty maintaining sleep at times.  She does not take anything for sleep.  Some years ago she tried melatonin but had vivid dreams.  She follows with Dr. Oneita once a year.  She was able to wean off of imipramine for migraine prevention and uses eletriptan infrequently for acute migraines.  She has worked on weight loss.  She is working with a program through her insurance. She drinks caffeine in the form of soda or tea, about 2 servings per day on average.  She drinks alcohol occasionally.  She is a non-smoker.  They have no indoor pets, they have outdoor cats.   REVIEW OF SYSTEMS: Out of a complete 14 system review of symptoms, the patient complains only of the following symptoms, weight gain, fatigue, anxiety and all other reviewed systems are negative.  ESS: 12/24, previously 9/24  ALLERGIES: Allergies  Allergen Reactions   Bactrim  [Sulfamethoxazole -Trimethoprim ] Nausea And Vomiting   Erythromycin Hives    REACTION: rash on trunk of body   Iodinated Contrast Media Swelling and Other (See Comments)    Reaction: itching,swelling around the eyes    HOME MEDICATIONS: Outpatient Medications Prior to Visit  Medication Sig Dispense Refill   acetaminophen  (TYLENOL  8 HOUR) 650 MG CR tablet Take 650 mg by mouth every 8 (eight) hours as needed for pain.     busPIRone  (BUSPAR ) 5 MG tablet Take 1 tablet (5 mg total) by mouth 2 (two) times daily. 180 tablet 2   eletriptan (RELPAX) 40 MG tablet Take 40 mg by mouth as needed. may repeat in 2 hours if necessary for migraines     levothyroxine  (SYNTHROID ) 112 MCG tablet TAKE (1) TABLET BY MOUTH EACH MORNING ON AN EMPTY STOMACH. 90 tablet 1   losartan -hydrochlorothiazide  (HYZAAR) 100-12.5 MG tablet TAKE (1) TABLET BY MOUTH ONCE DAILY.     metFORMIN  (GLUCOPHAGE ) 500 MG tablet TAKE 1 TABLET  TWICE DAILY  WITH MEALS 180 tablet 1   pantoprazole  (PROTONIX ) 40 MG tablet Take 1 tablet (40 mg total) by mouth daily. 90 tablet 3   rosuvastatin  (CRESTOR ) 10 MG tablet TAKE 1 TABLET DAILY 90 tablet 1   VITAMIN D  PO Take by mouth daily.     No facility-administered medications prior to visit.    PAST MEDICAL HISTORY: Past Medical History:  Diagnosis Date   Abdominal pain 2021   CT abd neg. suspect abd wall pain vs functional GI pain   Carpal tunnel syndrome    Cervical spondylosis    Depression    Family history of adverse reaction to anesthesia    GERD (gastroesophageal reflux disease)    with mild esoph dysmotility and small sliding HH on barium esophagram 06/2022   History of MRSA infection    R hand soft tissue  Hyperlipidemia    Hypertension    Hypothyroidism    IFG (impaired fasting glucose)    A1c's 5.5-5.6% 2019-2022   Joint pain    Migraine syndrome    occasional   Obesity, Class II, BMI 35-39.9    OSA on CPAP 11/2013   severe; needs compliance monitoring->referred to neurologist ([facility])   Osteoarthritis    Knees, ankles, fingers, wrists, sometimes hips.  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    PAST SURGICAL HISTORY: Past Surgical History:  Procedure Laterality Date   APPENDECTOMY  6 yrs ago   Dr. Mavis   CATARACT EXTRACTION W/PHACO Right 06/06/2024   Procedure: PHACOEMULSIFICATION, CATARACT, WITH IOL INSERTION;  Surgeon: Harrie Agent, MD;  Location: [facility];  Service: Ophthalmology;  Laterality: Right;  CDE 10.56   COLONOSCOPY  2012; 02/20/20   approx 2012, normal. 01/2020 polyp x 1->per Dr. Kristie recall 5-10 yrs   CYSTOSCOPY     for pain in side; normal   ESOPHAGOGASTRODUODENOSCOPY     This was done for dysphagia/regurgitation.  Esophageal stricture but no dilatation required.-->  Symptoms improved with daily PPI.   FOOT SURGERY Right 2017   dorsal tarsal  exostectomy, endoscopic plantar fasciotomy, bunionectomy with screw and removal of toenail 2nd toe R foot.   GANGLION CYST EXCISION     polysomnogram  11/2013   severe OSA   THYROIDECTOMY  12/10/2011   Bx benign.  Enlarging goiter.  Procedure: THYROIDECTOMY;  Surgeon: Oneil DELENA Mavis, MD;  Location: [facility];  Service: General;  Laterality: N/A;  Total Thyroidectomy   TRANSTHORACIC ECHOCARDIOGRAM  05/17/2015   EF 65-70%, mod LVH, normal wall motion, grd I DD, mild MR.   UPPER GI ENDOSCOPY     wisdon teeth      FAMILY HISTORY: Family History  Problem Relation Age of Onset   Hypertension Mother    Cancer Mother        lung   Migraines Mother    Ulcers Mother    Hypertension Father    Migraines Father    Heart disease Father    Ulcers Father    Early death Sister    Diabetes Sister    Diabetes Paternal Grandmother    Kidney disease Paternal Grandfather        kidney failure   Heart attack Paternal Grandfather    Early death Sister    Obesity Sister    Hypertension Sister    Migraines Sister    Cancer Sister        ? cervical cancer   Multiple sclerosis Sister    Breast cancer Sister    Pseudochol deficiency Neg Hx    Malignant hyperthermia Neg Hx    Hypotension Neg Hx    Anesthesia problems Neg Hx     SOCIAL HISTORY: Social History   Socioeconomic History   Marital status: Married    Spouse name: Not on file   Number of children: Not on file   Years of education: Not on file   Highest education level: Master's degree (e.g., MA, MS, MEng, MEd, MSW, MBA)  Occupational History   Not on file  Tobacco Use   Smoking status: Never    Passive exposure: Past   Smokeless tobacco: Never  Vaping Use   Vaping status: Never Used  Substance and Sexual Activity   Alcohol use: Not Currently    Comment: very rarely  Drug use: No   Sexual activity: Yes    Birth control/protection: Post-menopausal  Other Topics Concern   Not on file  Social History Narrative   Married, 3  step children.  She raised her nephew who has special needs.   Educ: masters deg   Occup: retired Psychologist, prison and probation services   Rare alcohol.   No tob.   Social Drivers of Corporate investment banker Strain: Low Risk  (11/25/2023)   Overall Financial Resource Strain (CARDIA)    Difficulty of Paying Living Expenses: Not hard at all  Food Insecurity: No Food Insecurity (11/25/2023)   Hunger Vital Sign    Worried About Running Out of Food in the Last Year: Never true    Ran Out of Food in the Last Year: Never true  Transportation Needs: No Transportation Needs (11/25/2023)   PRAPARE - Administrator, Civil Service (Medical): No    Lack of Transportation (Non-Medical): No  Physical Activity: Insufficiently Active (10/28/2023)   Exercise Vital Sign    Days of Exercise per Week: 1 day    Minutes of Exercise per Session: 10 min  Stress: Stress Concern Present (11/25/2023)   [employer] of Occupational Health - Occupational Stress Questionnaire    Feeling of Stress : To some extent  Social Connections: Socially Integrated (11/25/2023)   Social Connection and Isolation Panel    Frequency of Communication with Friends and Family: Three times a week    Frequency of Social Gatherings with Friends and Family: Twice a week    Attends Religious Services: More than 4 times per year    Active Member of [employer] or Organizations: Yes    Attends Engineer, structural: More than 4 times per year    Marital Status: Married  Catering manager Violence: Not At Risk (11/25/2023)   Humiliation, Afraid, Rape, and Kick questionnaire    Fear of Current or Ex-Partner: No    Emotionally Abused: No    Physically Abused: No    Sexually Abused: No     PHYSICAL EXAM  There were no vitals filed for this visit.    There is no height or weight on file to calculate BMI.  Generalized: Well developed, in no acute distress  Cardiology: normal rate and rhythm, no murmur noted Respiratory: clear to  auscultation bilaterally  Neurological examination  Mentation: Alert oriented to time, place, history taking. Follows all commands speech and language fluent Cranial nerve II-XII: Pupils were equal round reactive to light. Extraocular movements were full, visual field were full  Motor: The motor testing reveals 5 over 5 strength of all 4 extremities. Good symmetric motor tone is noted throughout.  Gait and station: Gait is normal.    DIAGNOSTIC DATA (LABS, IMAGING, TESTING) - I reviewed patient records, labs, notes, testing and imaging myself where available.      No data to display           Lab Results  Component Value Date   WBC 7.4 10/28/2023   HGB 14.4 10/28/2023   HCT 43.7 10/28/2023   MCV 89.5 10/28/2023   PLT 213 10/28/2023      Component Value Date/Time   NA 141 10/28/2023 1022   NA 144 03/20/2022 1008   K 3.9 10/28/2023 1022   CL 104 10/28/2023 1022   CO2 27 10/28/2023 1022   GLUCOSE 111 (H) 10/28/2023 1022   BUN 13 10/28/2023 1022   BUN 10 03/20/2022 1008   CREATININE 0.93 10/28/2023 1022  CALCIUM  9.9 10/28/2023 1022   PROT 6.9 10/28/2023 1022   PROT 6.6 03/20/2022 1008   ALBUMIN 4.4 10/24/2022 0941   ALBUMIN 4.6 03/20/2022 1008   AST 22 10/28/2023 1022   ALT 25 10/28/2023 1022   ALKPHOS 57 10/24/2022 0941   BILITOT 0.8 10/28/2023 1022   BILITOT 0.7 03/20/2022 1008   GFRNONAA >60 11/18/2018 1801   GFRAA >60 11/18/2018 1801   Lab Results  Component Value Date   CHOL 163 04/29/2023   HDL 67 04/29/2023   LDLCALC 70 04/29/2023   LDLDIRECT 68.0 10/23/2021   TRIG 182 (H) 04/29/2023   CHOLHDL 2.4 04/29/2023   Lab Results  Component Value Date   HGBA1C 5.3 02/01/2024   HGBA1C 5.3 02/01/2024   HGBA1C 5.3 (A) 02/01/2024   HGBA1C 5.3 02/01/2024   No results found for: VITAMINB12 Lab Results  Component Value Date   TSH 3.78 10/28/2023     ASSESSMENT AND PLAN 68 y.o. 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 here with   No diagnosis found.    Arbor Leer Soldo is doing well on CPAP therapy. Compliance report reveals excellent compliance. She was encouraged to continue using CPAP nightly and for greater than 4 hours each night. Risks of untreated sleep apnea review and education materials provided. She was encouraged to keep a close eye on BP. Follow up closely with PCP for abnormal blood work. I encouraged her to treat headache when she gets home. Healthy lifestyle habits encouraged. She will follow up in 1 year, sooner if needed. She verbalizes understanding and agreement with this plan.    No orders of the defined types were placed in this encounter.    No orders of the defined types were placed in this encounter.      Greig Forbes, FNP-C 06/09/2024, 4:33 PM [facility] [address] [place], [place] [address] [phone]

## 2024-06-15 ENCOUNTER — Encounter (HOSPITAL_COMMUNITY)

## 2024-06-20 NOTE — Progress Notes (Deleted)
 SABRA

## 2024-06-21 ENCOUNTER — Other Ambulatory Visit: Payer: Self-pay | Admitting: Family Medicine

## 2024-06-21 ENCOUNTER — Ambulatory Visit: Payer: Medicare HMO | Admitting: Family Medicine

## 2024-06-21 DIAGNOSIS — G4733 Obstructive sleep apnea (adult) (pediatric): Secondary | ICD-10-CM

## 2024-06-21 DIAGNOSIS — E890 Postprocedural hypothyroidism: Secondary | ICD-10-CM

## 2024-06-21 NOTE — Patient Instructions (Signed)

## 2024-06-21 NOTE — Progress Notes (Unsigned)
 PATIENT: Theresa Young DOB: [birth year]  REASON FOR VISIT: follow up HISTORY FROM: patient  No chief complaint on file.    HISTORY OF PRESENT ILLNESS:  06/21/24 ALL:  Theresa Young returns for follow up for OSA on CPAP. She continues to do well on therapy.     06/22/2023 ALL:  Theresa Young returns for follow up for OSA on CPAP. She is doing well. She is using CPAP nightly for about 7-8 hours, on average. She does not sleep without her machine. She denies concerns with machine or supplies. She has had some trouble with weight gaine, anxiety and insomnia, recently. PCP recent updated blood work. TSH slightly increased. She has follow up soon to discuss. She does report having a headache, today. BP is usually well managed at home.     06/12/2022 ALL: Theresa Young returns for follow up for OSA on CPAP. She continues to well. She is using CPAP every night for about 7-8 hours. She reports really bad sore throat if she does not use CPAP. She denies concerns with supplies or machine.     06/11/2021 ALL: Theresa Young is a 68 y.o. female here today for follow up for OSA on CPAP.  She was previously on CPAP and needed new machine. HST 09/17/2020 confirmed severe OSA with total AHI of 72.3/hr and O2 nadir of 82%. AutoPAP ordered. She is doing fairly well on her new machine. She does not like the heated humidity. She would prefer to have heat turned off. She is not sure about the different settings or how to adjust humidity setting on her new machine. She also reports hearing a clicking noise every night as her machine is ramping. She denies concerns with supplies. She is resting well. She can not sleep without her machine.   Compliance report dated 05/12/2021-06/10/2021 shows that she used CPAP 30/30 days for greater than 4 hours. Average usage was 7hr . Residual AHI was 2 on 7-13cmH20. Pressure in the 95th percentile of 12.8. NO significant leak noted.   HISTORY: (copied from Dr Obie previous  note)  Dear Dr. Candise,    I saw your patient, Theresa Young, upon your kind request in my sleep clinic today for initial consultation of her sleep disorder, in particular, evaluation of her prior diagnosis of obstructive sleep apnea.  The patient is unaccompanied today.  As you know, Theresa Young is a 68 year old right-handed woman with an underlying medical history of hypertension, hyperlipidemia, hypothyroidism, vitamin D  deficiency, osteoarthritis, migraines (followed by Dr. Oneita), carpal tunnel syndrome, reflux disease, depression, cervical spondylosis, and severe obesity with a BMI of over 40, who was previously diagnosed with obstructive sleep apnea and placed on positive airway pressure treatment.  I reviewed your office note from 07/13/2020.  I was able to review her sleep study from 12/19/2013.  Study was interpreted by Dr. Milton time.  She probably had a split-night sleep study.  Baseline AHI was reportedly 51/h, O2 nadir 79% with a baseline oxygen saturation of 97%.  She was noted to have frequent PVCs.  She noted was noted to respond well to CPAP at 10 cm. Her Epworth sleepiness score is 12 out of 24, fatigue severity score 25 out of 63.  She lives with her husband and they are raising a great nephew, age 50.  She takes him to school.  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  Bedtime is generally between 930 and 10  and rise time around 630, latest by 6:45 AM.  She denies recurrent morning headaches but does have nocturia about once per average night.  She suspects that her father had sleep apnea, he died in his 42s from heart disease.  She uses a Primary School Teacher medium size full facemask, sometimes she notices the leak.  I was able to review her CPAP compliance data from her machine.  She has a S9 escape from [employer].  Pressure at 10 cm, leak and residual AHI information is unfortunately not available.  From 07/28/2020 through 08/26/2020 she  has used her machine every night with percent use days greater than 4 hours at 100%, indicating superb compliance, average usage of 7 hours and 52 minutes.  He does have some difficulty maintaining sleep at times.  She does not take anything for sleep.  Some years ago she tried melatonin but had vivid dreams.  She follows with Dr. Oneita once a year.  She was able to wean off of imipramine for migraine prevention and uses eletriptan infrequently for acute migraines.  She has worked on weight loss.  She is working with a program through her insurance. She drinks caffeine in the form of soda or tea, about 2 servings per day on average.  She drinks alcohol occasionally.  She is a non-smoker.  They have no indoor pets, they have outdoor cats.   REVIEW OF SYSTEMS: Out of a complete 14 system review of symptoms, the patient complains only of the following symptoms, weight gain, fatigue, anxiety and all other reviewed systems are negative.  ESS: 12/24, previously 9/24  ALLERGIES: Allergies  Allergen Reactions   Bactrim  [Sulfamethoxazole -Trimethoprim ] Nausea And Vomiting   Erythromycin Hives    REACTION: rash on trunk of body   Iodinated Contrast Media Swelling and Other (See Comments)    Reaction: itching,swelling around the eyes    HOME MEDICATIONS: Outpatient Medications Prior to Visit  Medication Sig Dispense Refill   acetaminophen  (TYLENOL  8 HOUR) 650 MG CR tablet Take 650 mg by mouth every 8 (eight) hours as needed for pain.     busPIRone  (BUSPAR ) 5 MG tablet Take 1 tablet (5 mg total) by mouth 2 (two) times daily. 180 tablet 2   eletriptan (RELPAX) 40 MG tablet Take 40 mg by mouth as needed. may repeat in 2 hours if necessary for migraines     levothyroxine  (SYNTHROID ) 112 MCG tablet TAKE 1 TABLET EACH MORNING ON AN EMPTY STOMACH 90 tablet 0   losartan -hydrochlorothiazide  (HYZAAR) 100-12.5 MG tablet TAKE (1) TABLET BY MOUTH ONCE DAILY.     metFORMIN  (GLUCOPHAGE ) 500 MG tablet TAKE 1 TABLET  TWICE DAILY  WITH MEALS 180 tablet 1   pantoprazole  (PROTONIX ) 40 MG tablet Take 1 tablet (40 mg total) by mouth daily. 90 tablet 3   rosuvastatin  (CRESTOR ) 10 MG tablet TAKE 1 TABLET DAILY 90 tablet 1   VITAMIN D  PO Take by mouth daily.     No facility-administered medications prior to visit.    PAST MEDICAL HISTORY: Past Medical History:  Diagnosis Date   Abdominal pain 2021   CT abd neg. suspect abd wall pain vs functional GI pain   Carpal tunnel syndrome    Cervical spondylosis    Depression    Family history of adverse reaction to anesthesia    GERD (gastroesophageal reflux disease)    with mild esoph dysmotility and small sliding HH on barium esophagram 06/2022   History of MRSA infection    R hand soft tissue  Hyperlipidemia    Hypertension    Hypothyroidism    IFG (impaired fasting glucose)    A1c's 5.5-5.6% 2019-2022   Joint pain    Migraine syndrome    occasional   Obesity, Class II, BMI 35-39.9    OSA on CPAP 11/2013   severe; needs compliance monitoring->referred to neurologist ([facility])   Osteoarthritis    Knees, ankles, fingers, wrists, sometimes hips.  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    PAST SURGICAL HISTORY: Past Surgical History:  Procedure Laterality Date   APPENDECTOMY  6 yrs ago   Dr. Mavis   CATARACT EXTRACTION W/PHACO Right 06/06/2024   Procedure: PHACOEMULSIFICATION, CATARACT, WITH IOL INSERTION;  Surgeon: Harrie Agent, MD;  Location: [facility];  Service: Ophthalmology;  Laterality: Right;  CDE 10.56   COLONOSCOPY  2012; 02/20/20   approx 2012, normal. 01/2020 polyp x 1->per Dr. Kristie recall 5-10 yrs   CYSTOSCOPY     for pain in side; normal   ESOPHAGOGASTRODUODENOSCOPY     This was done for dysphagia/regurgitation.  Esophageal stricture but no dilatation required.-->  Symptoms improved with daily PPI.   FOOT SURGERY Right 2017   dorsal tarsal  exostectomy, endoscopic plantar fasciotomy, bunionectomy with screw and removal of toenail 2nd toe R foot.   GANGLION CYST EXCISION     polysomnogram  11/2013   severe OSA   THYROIDECTOMY  12/10/2011   Bx benign.  Enlarging goiter.  Procedure: THYROIDECTOMY;  Surgeon: Oneil DELENA Mavis, MD;  Location: [facility];  Service: General;  Laterality: N/A;  Total Thyroidectomy   TRANSTHORACIC ECHOCARDIOGRAM  05/17/2015   EF 65-70%, mod LVH, normal wall motion, grd I DD, mild MR.   UPPER GI ENDOSCOPY     wisdon teeth      FAMILY HISTORY: Family History  Problem Relation Age of Onset   Hypertension Mother    Cancer Mother        lung   Migraines Mother    Ulcers Mother    Hypertension Father    Migraines Father    Heart disease Father    Ulcers Father    Early death Sister    Diabetes Sister    Diabetes Paternal Grandmother    Kidney disease Paternal Grandfather        kidney failure   Heart attack Paternal Grandfather    Early death Sister    Obesity Sister    Hypertension Sister    Migraines Sister    Cancer Sister        ? cervical cancer   Multiple sclerosis Sister    Breast cancer Sister    Pseudochol deficiency Neg Hx    Malignant hyperthermia Neg Hx    Hypotension Neg Hx    Anesthesia problems Neg Hx     SOCIAL HISTORY: Social History   Socioeconomic History   Marital status: Married    Spouse name: Not on file   Number of children: Not on file   Years of education: Not on file   Highest education level: Master's degree (e.g., MA, MS, MEng, MEd, MSW, MBA)  Occupational History   Not on file  Tobacco Use   Smoking status: Never    Passive exposure: Past   Smokeless tobacco: Never  Vaping Use   Vaping status: Never Used  Substance and Sexual Activity   Alcohol use: Not Currently    Comment: very rarely  Drug use: No   Sexual activity: Yes    Birth control/protection: Post-menopausal  Other Topics Concern   Not on file  Social History Narrative   Married, 3  step children.  She raised her nephew who has special needs.   Educ: masters deg   Occup: retired psychologist, prison and probation services   Rare alcohol.   No tob.   Social Drivers of Corporate Investment Banker Strain: Low Risk  (11/25/2023)   Overall Financial Resource Strain (CARDIA)    Difficulty of Paying Living Expenses: Not hard at all  Food Insecurity: No Food Insecurity (11/25/2023)   Hunger Vital Sign    Worried About Running Out of Food in the Last Year: Never true    Ran Out of Food in the Last Year: Never true  Transportation Needs: No Transportation Needs (11/25/2023)   PRAPARE - Administrator, Civil Service (Medical): No    Lack of Transportation (Non-Medical): No  Physical Activity: Insufficiently Active (10/28/2023)   Exercise Vital Sign    Days of Exercise per Week: 1 day    Minutes of Exercise per Session: 10 min  Stress: Stress Concern Present (11/25/2023)   [employer] of Occupational Health - Occupational Stress Questionnaire    Feeling of Stress : To some extent  Social Connections: Socially Integrated (11/25/2023)   Social Connection and Isolation Panel    Frequency of Communication with Friends and Family: Three times a week    Frequency of Social Gatherings with Friends and Family: Twice a week    Attends Religious Services: More than 4 times per year    Active Member of [employer] or Organizations: Yes    Attends Engineer, Structural: More than 4 times per year    Marital Status: Married  Catering Manager Violence: Not At Risk (11/25/2023)   Humiliation, Afraid, Rape, and Kick questionnaire    Fear of Current or Ex-Partner: No    Emotionally Abused: No    Physically Abused: No    Sexually Abused: No     PHYSICAL EXAM  There were no vitals filed for this visit.    There is no height or weight on file to calculate BMI.  Generalized: Well developed, in no acute distress  Cardiology: normal rate and rhythm, no murmur noted Respiratory: clear to  auscultation bilaterally  Neurological examination  Mentation: Alert oriented to time, place, history taking. Follows all commands speech and language fluent Cranial nerve II-XII: Pupils were equal round reactive to light. Extraocular movements were full, visual field were full  Motor: The motor testing reveals 5 over 5 strength of all 4 extremities. Good symmetric motor tone is noted throughout.  Gait and station: Gait is normal.    DIAGNOSTIC DATA (LABS, IMAGING, TESTING) - I reviewed patient records, labs, notes, testing and imaging myself where available.      No data to display           Lab Results  Component Value Date   WBC 7.4 10/28/2023   HGB 14.4 10/28/2023   HCT 43.7 10/28/2023   MCV 89.5 10/28/2023   PLT 213 10/28/2023      Component Value Date/Time   NA 141 10/28/2023 1022   NA 144 03/20/2022 1008   K 3.9 10/28/2023 1022   CL 104 10/28/2023 1022   CO2 27 10/28/2023 1022   GLUCOSE 111 (H) 10/28/2023 1022   BUN 13 10/28/2023 1022   BUN 10 03/20/2022 1008   CREATININE 0.93 10/28/2023 1022  CALCIUM  9.9 10/28/2023 1022   PROT 6.9 10/28/2023 1022   PROT 6.6 03/20/2022 1008   ALBUMIN 4.4 10/24/2022 0941   ALBUMIN 4.6 03/20/2022 1008   AST 22 10/28/2023 1022   ALT 25 10/28/2023 1022   ALKPHOS 57 10/24/2022 0941   BILITOT 0.8 10/28/2023 1022   BILITOT 0.7 03/20/2022 1008   GFRNONAA >60 11/18/2018 1801   GFRAA >60 11/18/2018 1801   Lab Results  Component Value Date   CHOL 163 04/29/2023   HDL 67 04/29/2023   LDLCALC 70 04/29/2023   LDLDIRECT 68.0 10/23/2021   TRIG 182 (H) 04/29/2023   CHOLHDL 2.4 04/29/2023   Lab Results  Component Value Date   HGBA1C 5.3 02/01/2024   HGBA1C 5.3 02/01/2024   HGBA1C 5.3 (A) 02/01/2024   HGBA1C 5.3 02/01/2024   No results found for: VITAMINB12 Lab Results  Component Value Date   TSH 3.78 10/28/2023     ASSESSMENT AND PLAN 68 y.o. 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 here with   No diagnosis found.    Annarae Macnair Guion is doing well on CPAP therapy. Compliance report reveals excellent compliance. She was encouraged to continue using CPAP nightly and for greater than 4 hours each night. Risks of untreated sleep apnea review and education materials provided. She was encouraged to keep a close eye on BP. Follow up closely with PCP for abnormal blood work. I encouraged her to treat headache when she gets home. Healthy lifestyle habits encouraged. She will follow up in 1 year, sooner if needed. She verbalizes understanding and agreement with this plan.    No orders of the defined types were placed in this encounter.    No orders of the defined types were placed in this encounter.      Greig Forbes, FNP-C 06/21/2024, 11:12 AM [facility] [address] [place], [place] [address] [phone]

## 2024-06-21 NOTE — Progress Notes (Unsigned)
 Theresa Young

## 2024-06-22 ENCOUNTER — Encounter: Payer: Self-pay | Admitting: Family Medicine

## 2024-06-22 ENCOUNTER — Ambulatory Visit: Admitting: Family Medicine

## 2024-06-22 VITALS — BP 118/75 | HR 79 | Ht 67.0 in | Wt 273.0 lb

## 2024-06-22 DIAGNOSIS — G4733 Obstructive sleep apnea (adult) (pediatric): Secondary | ICD-10-CM | POA: Diagnosis not present

## 2024-06-27 ENCOUNTER — Telehealth: Payer: Self-pay

## 2024-06-27 ENCOUNTER — Other Ambulatory Visit: Payer: Self-pay

## 2024-06-27 ENCOUNTER — Other Ambulatory Visit: Payer: Self-pay | Admitting: Family Medicine

## 2024-06-27 MED ORDER — PANTOPRAZOLE SODIUM 40 MG PO TBEC: 40.0000 mg | DELAYED_RELEASE_TABLET | Freq: Every day | ORAL | 0 refills | Status: DC

## 2024-06-27 NOTE — Telephone Encounter (Signed)
 Copied from CRM [id number]. Topic: Clinical - Medication Refill >> Jun 27, 2024  9:44 AM Rea C wrote: Medication: pantoprazole  (PROTONIX ) 40 MG tablet  Has the patient contacted their pharmacy? Glen from [employer] is requesting if a temporary supply of prescription can be sent to [facility].   (Agent: If no, request that the patient contact the pharmacy for the refill. If patient does not wish to contact the pharmacy document the reason why and proceed with request.) (Agent: If yes, when and what did the pharmacy advise?)  This is the patient's preferred pharmacy:  [facility] [phone] - [place], [place] - [address] AT [facility] [address] & [address] [place] [address] Phone: [phone] Fax: [phone] Hours: Not open 24 hours    Is this the correct pharmacy for this prescription? Yes If no, delete pharmacy and type the correct one.   Has the prescription been filled recently? No  Is the patient out of the medication? Yes  Has the patient been seen for an appointment in the last year OR does the patient have an upcoming appointment? Yes  Can we respond through MyChart? Yes  Agent: Please be advised that Rx refills may take up to 3 business days. We ask that you follow-up with your pharmacy.

## 2024-06-27 NOTE — Telephone Encounter (Signed)
>>   Jun 27, 2024  9:49 AM Rea BROCKS wrote: Patient's mail order of the medication was lost in transit which is why a temporary order is being requested. Patient is completely out.   Copied from CRM [id number]. Topic: Clinical - Medication Refill >> Jun 27, 2024  9:44 AM Rea C wrote: Medication: pantoprazole  (PROTONIX ) 40 MG tablet  Has the patient contacted their pharmacy? Glen from [employer] is requesting if a temporary supply of prescription can be sent to [facility].   (Agent: If no, request that the patient contact the pharmacy for the refill. If patient does not wish to contact the pharmacy document the reason why and proceed with request.) (Agent: If yes, when and what did the pharmacy advise?)  This is the patient's preferred pharmacy:  [facility] [phone] - [place], [place] - [address] AT [facility] OF [address] [place] [address] Phone: [phone] Fax: [phone] Hours: Not open 24 hours    Is this the correct pharmacy for this prescription? Yes If no, delete pharmacy and type the correct one.   Has the prescription been filled recently? No  Is the patient out of the medication? Yes  Has the patient been seen for an appointment in the last year OR does the patient have an upcoming appointment? Yes  Can we respond through MyChart? Yes  Agent: Please be advised that Rx refills may take up to 3 business days. We ask that you follow-up with your pharmacy.

## 2024-06-28 ENCOUNTER — Other Ambulatory Visit: Payer: Self-pay | Admitting: Family Medicine

## 2024-06-28 MED ORDER — LOSARTAN POTASSIUM-HCTZ 100-12.5 MG PO TABS: ORAL_TABLET | ORAL | 0 refills | Status: DC

## 2024-06-28 NOTE — Telephone Encounter (Signed)
 Copied from CRM [id number]. Topic: Clinical - Medication Refill >> Jun 28, 2024 11:22 AM Suzen RAMAN wrote: Medication: losartan -hydrochlorothiazide  (HYZAAR) 100-12.5 MG tablet   Has the patient contacted their pharmacy? Yes   This is the patient's preferred pharmacy:  [facility] [place], [place] - [id number] [place] [address] Phone: [phone] Fax: [phone]  [facility] - [place], [place] - [address] [facility] AT [address] [facility] [address] [place] [address] Phone: [phone] Fax: [phone]   Is this the correct pharmacy for this prescription? Yes If no, delete pharmacy and type the correct one.   Has the prescription been filled recently? No  Is the patient out of the medication? No  Has the patient been seen for an appointment in the last year OR does the patient have an upcoming appointment? Yes  Can we respond through MyChart? Yes  Agent: Please be advised that Rx refills may take up to 3 business days. We ask that you follow-up with your pharmacy.

## 2024-08-03 ENCOUNTER — Ambulatory Visit: Admitting: Family Medicine

## 2024-08-03 VITALS — BP 134/84 | HR 71 | Temp 98.2°F | Ht 67.0 in | Wt 266.2 lb

## 2024-08-03 DIAGNOSIS — F411 Generalized anxiety disorder: Secondary | ICD-10-CM | POA: Diagnosis not present

## 2024-08-03 DIAGNOSIS — Z79899 Other long term (current) drug therapy: Secondary | ICD-10-CM

## 2024-08-03 DIAGNOSIS — Z5181 Encounter for therapeutic drug level monitoring: Secondary | ICD-10-CM | POA: Diagnosis not present

## 2024-08-03 DIAGNOSIS — E7800 Pure hypercholesterolemia, unspecified: Secondary | ICD-10-CM | POA: Diagnosis not present

## 2024-08-03 DIAGNOSIS — Z6841 Body Mass Index (BMI) 40.0 and over, adult: Secondary | ICD-10-CM | POA: Diagnosis not present

## 2024-08-03 DIAGNOSIS — E890 Postprocedural hypothyroidism: Secondary | ICD-10-CM | POA: Diagnosis not present

## 2024-08-03 DIAGNOSIS — R7303 Prediabetes: Secondary | ICD-10-CM

## 2024-08-03 LAB — POCT GLYCOSYLATED HEMOGLOBIN (HGB A1C)
HbA1c POC (<> result, manual entry): 5.5 % (ref 4.0–5.6)
HbA1c, POC (controlled diabetic range): 5.5 % (ref 0.0–7.0)
HbA1c, POC (prediabetic range): 5.5 % — AB (ref 5.7–6.4)
Hemoglobin A1C: 5.5 % (ref 4.0–5.6)

## 2024-08-03 LAB — COMPREHENSIVE METABOLIC PANEL WITH GFR
ALT: 29 U/L (ref 0–35)
AST: 30 U/L (ref 0–37)
Albumin: 4.7 g/dL (ref 3.5–5.2)
Alkaline Phosphatase: 58 U/L (ref 39–117)
BUN: 11 mg/dL (ref 6–23)
CO2: 29 meq/L (ref 19–32)
Calcium: 9.8 mg/dL (ref 8.4–10.5)
Chloride: 105 meq/L (ref 96–112)
Creatinine, Ser: 0.83 mg/dL (ref 0.40–1.20)
GFR: 73.36 mL/min (ref >60.00)
Glucose, Bld: 116 mg/dL — ABNORMAL HIGH (ref 70–99)
Potassium: 3.8 meq/L (ref 3.5–5.1)
Sodium: 142 meq/L (ref 135–145)
Total Bilirubin: 0.7 mg/dL (ref 0.2–1.2)
Total Protein: 6.3 g/dL (ref 6.0–8.3)

## 2024-08-03 LAB — LIPID PANEL
Cholesterol: 127 mg/dL (ref 0–200)
HDL: 54.2 mg/dL (ref >39.00)
LDL Cholesterol: 50 mg/dL (ref 0–99)
NonHDL: 72.5
Total CHOL/HDL Ratio: 2
Triglycerides: 113 mg/dL (ref 0.0–149.0)
VLDL: 22.6 mg/dL (ref 0.0–40.0)

## 2024-08-03 LAB — VITAMIN B12: Vitamin B-12: 126 pg/mL — ABNORMAL LOW (ref 211–911)

## 2024-08-03 MED ORDER — LEVOTHYROXINE SODIUM 112 MCG PO TABS: ORAL_TABLET | ORAL | 3 refills | Status: AC

## 2024-08-03 MED ORDER — LOSARTAN POTASSIUM-HCTZ 100-12.5 MG PO TABS: ORAL_TABLET | ORAL | 3 refills | Status: AC

## 2024-08-03 MED ORDER — PANTOPRAZOLE SODIUM 40 MG PO TBEC: 40.0000 mg | DELAYED_RELEASE_TABLET | Freq: Every day | ORAL | 3 refills | Status: AC

## 2024-08-03 NOTE — Progress Notes (Signed)
 OFFICE VISIT  08/03/2024  CC:  Chief Complaint  Patient presents with   Medical Management of Chronic Issues    Pt is fasting    Patient is a 68 y.o. female who presents for 71-month follow-up prediabetes, GAD, and hypertension. A/P as of last visit: 1 prediabetes, doing well on metformin  500 mg twice a day. A1c and fasting glucose ordered today.   #2 hypertension, stable on losartan -HCTZ 100-12.51 tablet daily. Check electrolytes and creatinine today.   3.  Diffuse arthralgias. Low suspicion autoimmune disease per rheumatologist.   #4 generalized anxiety disorder. Doing well on BuSpar  5 mg twice daily.   #5 carpal tunnel syndrome bilaterally. Rheumatologist has referred her for nerve conduction studies and we will then see her for follow-up. Of note, bedside [facility] ultrasound today showed right median nerve area 1.5 cm, left 1.1 cm..  INTERIM HX: Theresa Young is feeling very well. She has improved her diet.  She has a plan for starting an exercise regimen in January.  ROS as above, plus--> no fevers, no CP, no SOB, no wheezing, no cough, no dizziness, no HAs, no rashes, no melena/hematochezia.  No polyuria or polydipsia.  No myalgias or arthralgias.  No focal weakness, paresthesias, or tremors.  No acute vision or hearing abnormalities.  No dysuria or unusual/new urinary urgency or frequency.  No recent changes in lower legs. No n/v/d or abd pain.  No palpitations.     Past Medical History:  Diagnosis Date   Abdominal pain 2021   CT abd neg. suspect abd wall pain vs functional GI pain   Carpal tunnel syndrome    Cervical spondylosis    Depression    Family history of adverse reaction to anesthesia    GERD (gastroesophageal reflux disease)    with mild esoph dysmotility and small sliding HH on barium esophagram 06/2022   History of MRSA infection    R hand soft tissue   Hyperlipidemia    Hypertension    Hypothyroidism    IFG (impaired fasting glucose)    A1c's 5.5-5.6%  2019-2022   Joint pain    Migraine syndrome    occasional   Obesity, Class II, BMI 35-39.9    OSA on CPAP 11/2013   severe; needs compliance monitoring->referred to neurologist ([facility])   Osteoarthritis    Knees, ankles, fingers, wrists, sometimes hips.  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    Past Surgical History:  Procedure Laterality Date   APPENDECTOMY  6 yrs ago   Dr. Mavis   CATARACT EXTRACTION W/PHACO Right 06/06/2024   Procedure: PHACOEMULSIFICATION, CATARACT, WITH IOL INSERTION;  Surgeon: Harrie Agent, MD;  Location: [facility];  Service: Ophthalmology;  Laterality: Right;  CDE 10.56   COLONOSCOPY  2012; 02/20/20   approx 2012, normal. 01/2020 polyp x 1->per Dr. Kristie recall 5-10 yrs   CYSTOSCOPY     for pain in side; normal   ESOPHAGOGASTRODUODENOSCOPY     This was done for dysphagia/regurgitation.  Esophageal stricture but no dilatation required.-->  Symptoms improved with daily PPI.   FOOT SURGERY Right 2017   dorsal tarsal exostectomy, endoscopic plantar fasciotomy, bunionectomy with screw and removal of toenail 2nd toe R foot.   GANGLION CYST EXCISION     polysomnogram  11/2013   severe OSA   THYROIDECTOMY  12/10/2011   Bx benign.  Enlarging goiter.  Procedure: THYROIDECTOMY;  Surgeon: Oneil DELENA Mavis,  MD;  Location: [facility];  Service: General;  Laterality: N/A;  Total Thyroidectomy   TRANSTHORACIC ECHOCARDIOGRAM  05/17/2015   EF 65-70%, mod LVH, normal wall motion, grd I DD, mild MR.   UPPER GI ENDOSCOPY     wisdon teeth      Outpatient Medications Prior to Visit  Medication Sig Dispense Refill   busPIRone  (BUSPAR ) 5 MG tablet Take 1 tablet (5 mg total) by mouth 2 (two) times daily. 180 tablet 2   eletriptan (RELPAX) 40 MG tablet Take 40 mg by mouth as needed. may repeat in 2 hours if necessary for migraines     metFORMIN  (GLUCOPHAGE ) 500 MG tablet TAKE 1 TABLET TWICE DAILY   WITH MEALS. 180 tablet 0   rosuvastatin  (CRESTOR ) 10 MG tablet TAKE 1 TABLET DAILY 90 tablet 1   VITAMIN D  PO Take by mouth daily.     levothyroxine  (SYNTHROID ) 112 MCG tablet TAKE 1 TABLET EACH MORNING ON AN EMPTY STOMACH 90 tablet 0   losartan -hydrochlorothiazide  (HYZAAR) 100-12.5 MG tablet TAKE (1) TABLET BY MOUTH ONCE DAILY. 30 tablet 0   pantoprazole  (PROTONIX ) 40 MG tablet Take 1 tablet (40 mg total) by mouth daily. 30 tablet 0   No facility-administered medications prior to visit.    Allergies  Allergen Reactions   Bactrim  [Sulfamethoxazole -Trimethoprim ] Nausea And Vomiting   Erythromycin Hives    REACTION: rash on trunk of body   Iodinated Contrast Media Swelling and Other (See Comments)    Reaction: itching,swelling around the eyes    Review of Systems As per HPI  PE:    08/03/2024    9:12 AM 06/22/2024    7:44 AM 06/06/2024    8:24 AM  Vitals with BMI  Height 5' 7 5' 7   Weight 266 lbs 3 oz 273 lbs   BMI 41.68 42.75   Systolic 134 118 855  Diastolic 84 75 85  Pulse 71 79 73     Physical Exam  Gen: Alert, well appearing.  Patient is oriented to person, place, time, and situation. AFFECT: pleasant, lucid thought and speech. CV: RRR, no m/r/g.   LUNGS: CTA bilat, nonlabored resps, good aeration in all lung fields. EXT: no clubbing or cyanosis.  no edema.    LABS:  Last CBC Lab Results  Component Value Date   WBC 7.4 10/28/2023   HGB 14.4 10/28/2023   HCT 43.7 10/28/2023   MCV 89.5 10/28/2023   MCH 29.5 10/28/2023   RDW 12.8 10/28/2023   PLT 213 10/28/2023   Last metabolic panel Lab Results  Component Value Date   GLUCOSE 111 (H) 10/28/2023   NA 141 10/28/2023   K 3.9 10/28/2023   CL 104 10/28/2023   CO2 27 10/28/2023   BUN 13 10/28/2023   CREATININE 0.93 10/28/2023   GFR 63.97 10/24/2022   CALCIUM  9.9 10/28/2023   PHOS 3.9 12/11/2011   PROT 6.9 10/28/2023   ALBUMIN 4.4 10/24/2022   LABGLOB 2.0 03/20/2022   AGRATIO 2.3 (H) 03/20/2022    BILITOT 0.8 10/28/2023   ALKPHOS 57 10/24/2022   AST 22 10/28/2023   ALT 25 10/28/2023   ANIONGAP 10 11/18/2018   Last lipids Lab Results  Component Value Date   CHOL 163 04/29/2023   HDL 67 04/29/2023   LDLCALC 70 04/29/2023   LDLDIRECT 68.0 10/23/2021   TRIG 182 (H) 04/29/2023   CHOLHDL 2.4 04/29/2023   Last hemoglobin A1c Lab Results  Component Value Date   HGBA1C 5.5 08/03/2024   HGBA1C  5.5 08/03/2024   HGBA1C 5.5 (A) 08/03/2024   HGBA1C 5.5 08/03/2024   Last thyroid  functions Lab Results  Component Value Date   TSH 3.78 10/28/2023   FREET4 1.32 05/11/2023   THYROIDAB 2 10/01/2023   Last vitamin D  Lab Results  Component Value Date   VD25OH 46 04/29/2023   IMPRESSION AND PLAN:   prediabetes, doing well on metformin  500 mg twice a day. Hemoglobin A1c today is 5.5% Monitoring renal function today.   #2 hypertension, stable on losartan -HCTZ 100-12.51 tablet daily. Check electrolytes and creatinine today.   #3 obesity, BMI 41.7. She has made some improvements in diet and has lost 7 pounds in the last 4 to 6 weeks. She will be starting an exercise regimen next month.   #4 generalized anxiety disorder. Doing well on BuSpar  5 mg twice daily.  FOLLOW UP: Return in about 3 months (around 11/01/2024) for annual CPE (fasting). Next CPE 10/2024 Signed:  Gerlene Hockey, MD           08/03/2024

## 2024-08-03 NOTE — Patient Instructions (Addendum)
° ° °  [facility] Address: [address] [place], [place] [address] Phone: [phone]

## 2024-08-04 ENCOUNTER — Ambulatory Visit: Payer: Self-pay | Admitting: Family Medicine

## 2024-08-04 NOTE — Telephone Encounter (Signed)
 No further action needed at this time.

## 2024-08-05 ENCOUNTER — Encounter: Payer: Self-pay | Admitting: Family Medicine

## 2024-08-05 ENCOUNTER — Other Ambulatory Visit: Payer: Self-pay | Admitting: Family Medicine

## 2024-08-05 DIAGNOSIS — E890 Postprocedural hypothyroidism: Secondary | ICD-10-CM

## 2024-08-10 ENCOUNTER — Other Ambulatory Visit: Payer: Self-pay | Admitting: Adult Health

## 2024-08-10 ENCOUNTER — Other Ambulatory Visit: Payer: Self-pay | Admitting: Family Medicine

## 2024-08-10 DIAGNOSIS — E890 Postprocedural hypothyroidism: Secondary | ICD-10-CM

## 2024-08-12 NOTE — Progress Notes (Signed)
 KAMEKO HUKILL                                          MRN: [id number]   08/12/2024   The [facility] Quality Team Specialist reviewed this patient medical record for the purposes of chart review for care gap closure. The following were reviewed: chart review for care gap closure-kidney health evaluation for diabetes:eGFR  and uACR.    [facility] Quality Team

## 2024-09-14 ENCOUNTER — Encounter (HOSPITAL_COMMUNITY)

## 2024-09-19 ENCOUNTER — Ambulatory Visit (HOSPITAL_COMMUNITY): Admit: 2024-09-19 | Admitting: Ophthalmology

## 2024-09-26 ENCOUNTER — Other Ambulatory Visit (HOSPITAL_COMMUNITY)

## 2024-10-17 ENCOUNTER — Other Ambulatory Visit (HOSPITAL_COMMUNITY)

## 2024-10-19 ENCOUNTER — Ambulatory Visit: Admitting: Adult Health

## 2024-11-03 ENCOUNTER — Encounter: Admitting: Family Medicine

## 2024-11-30 ENCOUNTER — Encounter

## 2025-07-17 ENCOUNTER — Ambulatory Visit: Admitting: Family Medicine
